# Patient Record
Sex: Female | Born: 1950
Health system: Southern US, Community
[De-identification: ages and names within clinical notes are randomized; demographics above are authoritative.]

## PROBLEM LIST (undated history)

## (undated) DIAGNOSIS — R053 Chronic cough: Secondary | ICD-10-CM

## (undated) DIAGNOSIS — D649 Anemia, unspecified: Secondary | ICD-10-CM

## (undated) DIAGNOSIS — M1990 Unspecified osteoarthritis, unspecified site: Secondary | ICD-10-CM

## (undated) DIAGNOSIS — M720 Palmar fascial fibromatosis [Dupuytren]: Secondary | ICD-10-CM

## (undated) DIAGNOSIS — J449 Chronic obstructive pulmonary disease, unspecified: Secondary | ICD-10-CM

## (undated) DIAGNOSIS — C3490 Malignant neoplasm of unspecified part of unspecified bronchus or lung: Secondary | ICD-10-CM

## (undated) DIAGNOSIS — E039 Hypothyroidism, unspecified: Secondary | ICD-10-CM

## (undated) DIAGNOSIS — I509 Heart failure, unspecified: Secondary | ICD-10-CM

## (undated) HISTORY — DX: Anemia, unspecified: D64.9

## (undated) HISTORY — PX: MASTECTOMY PARTIAL / LUMPECTOMY: SUR851

## (undated) HISTORY — DX: Heart failure, unspecified: I50.9

## (undated) HISTORY — PX: BREAST EXCISIONAL BIOPSY: SUR124

---

## 1974-07-01 HISTORY — PX: TUBAL LIGATION: SHX77

## 2007-03-20 ENCOUNTER — Ambulatory Visit: Payer: Self-pay | Admitting: Obstetrics and Gynecology

## 2007-07-02 HISTORY — PX: COLONOSCOPY: SHX174

## 2007-07-10 ENCOUNTER — Ambulatory Visit: Payer: Self-pay | Admitting: Gastroenterology

## 2008-12-02 ENCOUNTER — Ambulatory Visit: Payer: Self-pay | Admitting: Obstetrics and Gynecology

## 2010-03-05 ENCOUNTER — Ambulatory Visit: Payer: Self-pay | Admitting: Internal Medicine

## 2010-03-06 ENCOUNTER — Ambulatory Visit: Payer: Self-pay | Admitting: Internal Medicine

## 2010-03-07 ENCOUNTER — Ambulatory Visit: Payer: Self-pay | Admitting: Family Medicine

## 2010-06-14 ENCOUNTER — Ambulatory Visit: Payer: Self-pay | Admitting: Unknown Physician Specialty

## 2012-06-04 ENCOUNTER — Emergency Department: Payer: Self-pay | Admitting: Internal Medicine

## 2014-05-09 DIAGNOSIS — J449 Chronic obstructive pulmonary disease, unspecified: Secondary | ICD-10-CM | POA: Insufficient documentation

## 2014-05-09 DIAGNOSIS — E039 Hypothyroidism, unspecified: Secondary | ICD-10-CM | POA: Insufficient documentation

## 2014-06-13 ENCOUNTER — Ambulatory Visit: Payer: Self-pay | Admitting: Internal Medicine

## 2016-04-10 DIAGNOSIS — M25531 Pain in right wrist: Secondary | ICD-10-CM | POA: Insufficient documentation

## 2016-04-10 DIAGNOSIS — M79641 Pain in right hand: Secondary | ICD-10-CM | POA: Insufficient documentation

## 2016-04-10 DIAGNOSIS — M79642 Pain in left hand: Secondary | ICD-10-CM | POA: Insufficient documentation

## 2017-04-28 DIAGNOSIS — M720 Palmar fascial fibromatosis [Dupuytren]: Secondary | ICD-10-CM | POA: Insufficient documentation

## 2017-11-25 DIAGNOSIS — Z5181 Encounter for therapeutic drug level monitoring: Secondary | ICD-10-CM | POA: Diagnosis not present

## 2017-11-25 DIAGNOSIS — E034 Atrophy of thyroid (acquired): Secondary | ICD-10-CM | POA: Diagnosis not present

## 2017-12-01 DIAGNOSIS — Z720 Tobacco use: Secondary | ICD-10-CM | POA: Diagnosis not present

## 2017-12-01 DIAGNOSIS — Z13220 Encounter for screening for lipoid disorders: Secondary | ICD-10-CM | POA: Diagnosis not present

## 2017-12-01 DIAGNOSIS — J449 Chronic obstructive pulmonary disease, unspecified: Secondary | ICD-10-CM | POA: Diagnosis not present

## 2017-12-01 DIAGNOSIS — E034 Atrophy of thyroid (acquired): Secondary | ICD-10-CM | POA: Diagnosis not present

## 2018-07-07 DIAGNOSIS — E034 Atrophy of thyroid (acquired): Secondary | ICD-10-CM | POA: Diagnosis not present

## 2018-07-07 DIAGNOSIS — Z136 Encounter for screening for cardiovascular disorders: Secondary | ICD-10-CM | POA: Diagnosis not present

## 2018-07-07 DIAGNOSIS — J449 Chronic obstructive pulmonary disease, unspecified: Secondary | ICD-10-CM | POA: Diagnosis not present

## 2018-07-14 DIAGNOSIS — Z0001 Encounter for general adult medical examination with abnormal findings: Secondary | ICD-10-CM | POA: Diagnosis not present

## 2018-07-14 DIAGNOSIS — Z1239 Encounter for other screening for malignant neoplasm of breast: Secondary | ICD-10-CM | POA: Diagnosis not present

## 2018-07-14 DIAGNOSIS — E034 Atrophy of thyroid (acquired): Secondary | ICD-10-CM | POA: Diagnosis not present

## 2018-07-14 DIAGNOSIS — Z124 Encounter for screening for malignant neoplasm of cervix: Secondary | ICD-10-CM | POA: Diagnosis not present

## 2018-07-14 DIAGNOSIS — Z0000 Encounter for general adult medical examination without abnormal findings: Secondary | ICD-10-CM | POA: Diagnosis not present

## 2018-07-14 DIAGNOSIS — F17210 Nicotine dependence, cigarettes, uncomplicated: Secondary | ICD-10-CM | POA: Diagnosis not present

## 2018-07-14 DIAGNOSIS — J449 Chronic obstructive pulmonary disease, unspecified: Secondary | ICD-10-CM | POA: Diagnosis not present

## 2018-07-14 DIAGNOSIS — Z720 Tobacco use: Secondary | ICD-10-CM | POA: Diagnosis not present

## 2018-08-05 ENCOUNTER — Other Ambulatory Visit: Payer: Self-pay | Admitting: Obstetrics and Gynecology

## 2018-08-05 DIAGNOSIS — Z1239 Encounter for other screening for malignant neoplasm of breast: Secondary | ICD-10-CM | POA: Diagnosis not present

## 2018-08-05 DIAGNOSIS — Z1231 Encounter for screening mammogram for malignant neoplasm of breast: Secondary | ICD-10-CM

## 2018-08-05 DIAGNOSIS — Z720 Tobacco use: Secondary | ICD-10-CM | POA: Diagnosis not present

## 2018-08-05 DIAGNOSIS — Z124 Encounter for screening for malignant neoplasm of cervix: Secondary | ICD-10-CM | POA: Diagnosis not present

## 2018-08-05 DIAGNOSIS — Z1211 Encounter for screening for malignant neoplasm of colon: Secondary | ICD-10-CM | POA: Diagnosis not present

## 2018-08-05 DIAGNOSIS — K621 Rectal polyp: Secondary | ICD-10-CM | POA: Diagnosis not present

## 2018-08-10 ENCOUNTER — Telehealth: Payer: Self-pay | Admitting: *Deleted

## 2018-08-10 DIAGNOSIS — Z122 Encounter for screening for malignant neoplasm of respiratory organs: Secondary | ICD-10-CM

## 2018-08-10 DIAGNOSIS — Z87891 Personal history of nicotine dependence: Secondary | ICD-10-CM

## 2018-08-10 NOTE — Telephone Encounter (Signed)
Received referral for initial lung cancer screening scan. Contacted patient and obtained smoking history,(current, 53 pack year) as well as answering questions related to screening process. Patient denies signs of lung cancer such as weight loss or hemoptysis. Patient denies comorbidity that would prevent curative treatment if lung cancer were found. Patient is scheduled for shared decision making visit and CT scan on 08/27/18 at 145pm.

## 2018-08-17 ENCOUNTER — Encounter: Payer: Self-pay | Admitting: Radiology

## 2018-08-17 ENCOUNTER — Ambulatory Visit
Admission: RE | Admit: 2018-08-17 | Discharge: 2018-08-17 | Disposition: A | Discharge status: Home / Self Care | Payer: Medicare HMO | Source: Ambulatory Visit | Attending: Obstetrics and Gynecology | Admitting: Obstetrics and Gynecology

## 2018-08-17 DIAGNOSIS — Z1231 Encounter for screening mammogram for malignant neoplasm of breast: Secondary | ICD-10-CM | POA: Insufficient documentation

## 2018-08-26 ENCOUNTER — Telehealth: Payer: Self-pay | Admitting: *Deleted

## 2018-08-26 NOTE — Telephone Encounter (Signed)
Called pt to remind her of her and her spouses appts for ldct screening on 08-27-2018@1345  and 1430, voiced understanding

## 2018-08-27 ENCOUNTER — Encounter: Payer: Self-pay | Admitting: Nurse Practitioner

## 2018-08-27 ENCOUNTER — Inpatient Hospital Stay: Payer: Medicare HMO | Attending: Nurse Practitioner | Admitting: Nurse Practitioner

## 2018-08-27 ENCOUNTER — Ambulatory Visit
Admission: RE | Admit: 2018-08-27 | Discharge: 2018-08-27 | Disposition: A | Discharge status: Home / Self Care | Payer: Medicare HMO | Source: Ambulatory Visit | Attending: Nurse Practitioner | Admitting: Nurse Practitioner

## 2018-08-27 DIAGNOSIS — Z122 Encounter for screening for malignant neoplasm of respiratory organs: Secondary | ICD-10-CM | POA: Insufficient documentation

## 2018-08-27 DIAGNOSIS — Z87891 Personal history of nicotine dependence: Secondary | ICD-10-CM | POA: Insufficient documentation

## 2018-08-27 DIAGNOSIS — Z720 Tobacco use: Secondary | ICD-10-CM

## 2018-08-27 DIAGNOSIS — F17210 Nicotine dependence, cigarettes, uncomplicated: Secondary | ICD-10-CM | POA: Diagnosis not present

## 2018-08-27 NOTE — Progress Notes (Signed)
In accordance with CMS guidelines, patient has met eligibility criteria including age, absence of signs or symptoms of lung cancer.  Social History   Tobacco Use  . Smoking status: Current Every Day Smoker    Packs/day: 1.00    Years: 53.00    Pack years: 53.00    Types: Cigarettes  Substance Use Topics  . Alcohol use: Not on file  . Drug use: Not on file      A shared decision-making session was conducted prior to the performance of CT scan. This includes one or more decision aids, includes benefits and harms of screening, follow-up diagnostic testing, over-diagnosis, false positive rate, and total radiation exposure.   Counseling on the importance of adherence to annual lung cancer LDCT screening, impact of co-morbidities, and ability or willingness to undergo diagnosis and treatment is imperative for compliance of the program.   Counseling on the importance of continued smoking cessation for former smokers; the importance of smoking cessation for current smokers, and information about tobacco cessation interventions have been given to patient including [place] and [facility] quit [place] programs.   Written order for lung cancer screening with LDCT has been given to the patient and any and all questions have been answered to the best of my abilities.    Yearly follow up will be coordinated by Burgess Estelle, Thoracic Navigator.  Beckey Rutter, DNP, AGNP-C [place] [facility] [phone] (work cell) [phone] (office) 08/27/18 2:45 PM

## 2018-08-31 ENCOUNTER — Telehealth: Payer: Self-pay | Admitting: *Deleted

## 2018-08-31 NOTE — Telephone Encounter (Signed)
Notified patient of LDCT lung cancer screening program results with recommendation for 12 month follow up imaging. Also notified of incidental findings noted below and is encouraged to discuss further with PCP who will receive a copy of this note and/or the CT report. Patient verbalizes understanding.   IMPRESSION: 1. Lung-RADS Category 2, benign appearance or behavior. Continue annual screening with low-dose chest CT without contrast in 12 months. 2. Coronary artery atherosclerosis.  Aortic Atherosclerosis (ICD10-I70.0) and Emphysema (ICD10-J43.9).

## 2018-09-02 ENCOUNTER — Encounter: Payer: Self-pay | Admitting: *Deleted

## 2019-03-31 DIAGNOSIS — E034 Atrophy of thyroid (acquired): Secondary | ICD-10-CM | POA: Diagnosis not present

## 2019-03-31 DIAGNOSIS — J449 Chronic obstructive pulmonary disease, unspecified: Secondary | ICD-10-CM | POA: Diagnosis not present

## 2019-04-02 DIAGNOSIS — J449 Chronic obstructive pulmonary disease, unspecified: Secondary | ICD-10-CM | POA: Diagnosis not present

## 2019-04-02 DIAGNOSIS — Z720 Tobacco use: Secondary | ICD-10-CM | POA: Diagnosis not present

## 2019-04-02 DIAGNOSIS — J329 Chronic sinusitis, unspecified: Secondary | ICD-10-CM | POA: Diagnosis not present

## 2019-04-02 DIAGNOSIS — E034 Atrophy of thyroid (acquired): Secondary | ICD-10-CM | POA: Diagnosis not present

## 2019-04-02 DIAGNOSIS — E038 Other specified hypothyroidism: Secondary | ICD-10-CM | POA: Diagnosis not present

## 2019-04-05 IMAGING — MG DIGITAL SCREENING BILATERAL MAMMOGRAM WITH TOMO AND CAD
8 series · 9 of 24 positions shown · non-contrast
Comparison: Previous exam(s).

CLINICAL DATA: Screening.

EXAM:
DIGITAL SCREENING BILATERAL MAMMOGRAM WITH TOMO AND CAD

[R CC synth-2D]
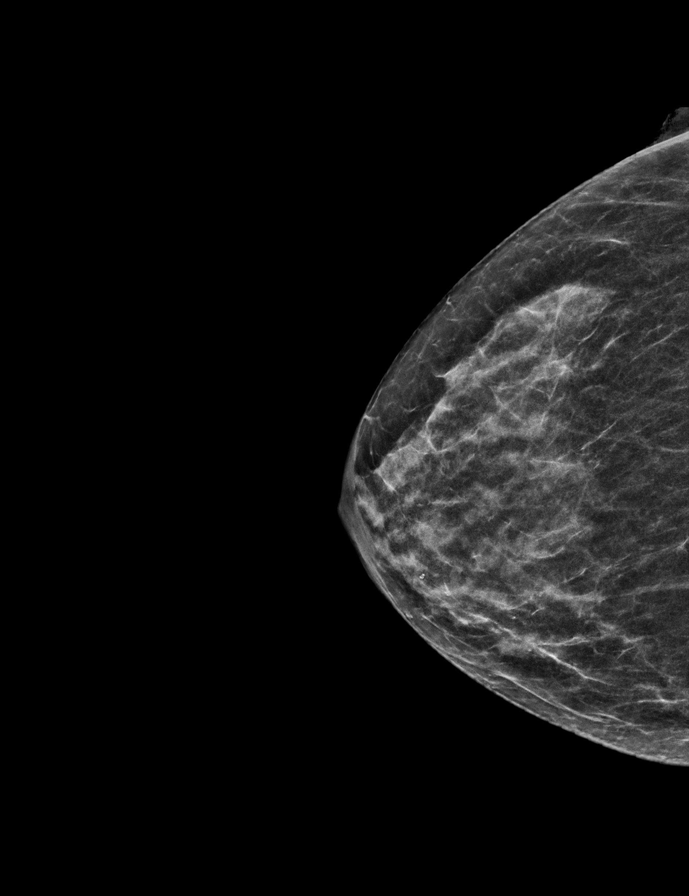

[L MLO synth-2D]
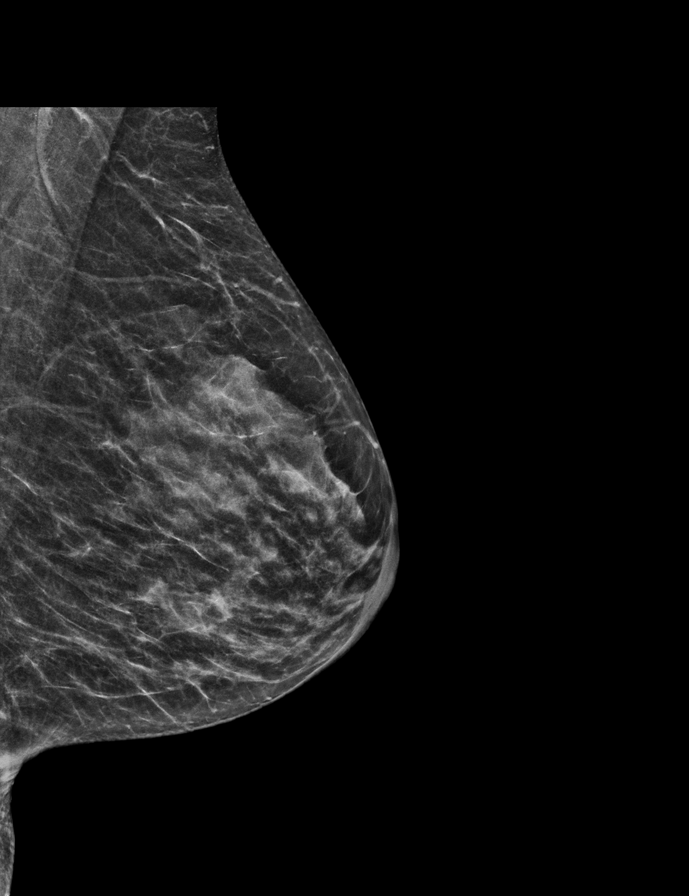

[L CC synth-2D]
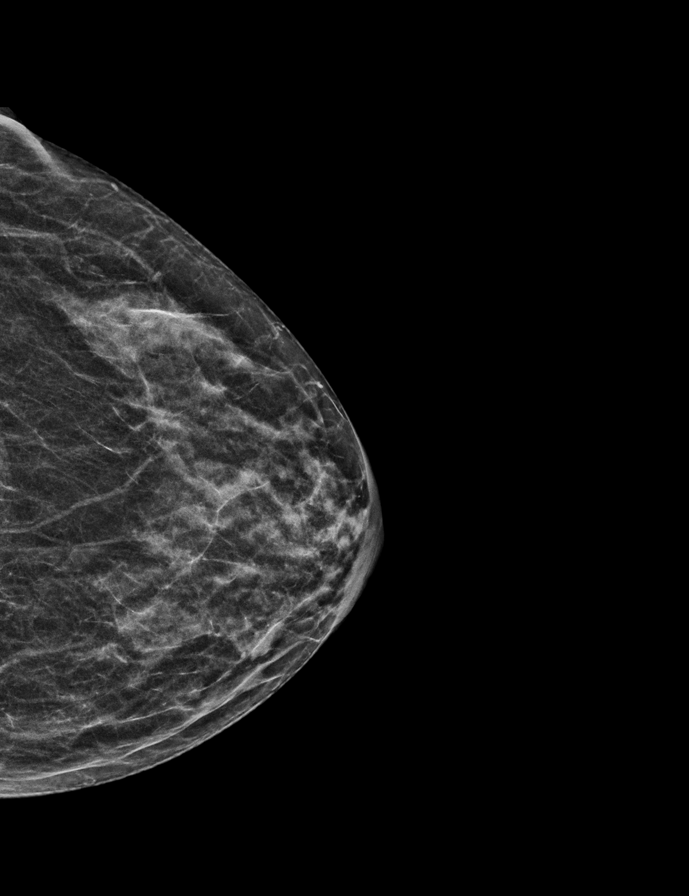

[R MLO synth-2D]
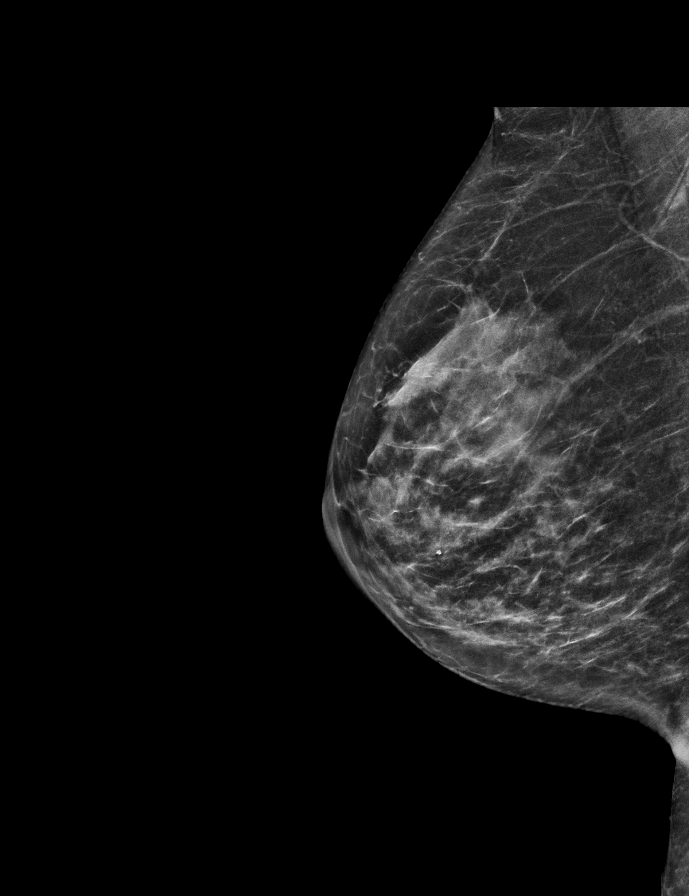

[R MLO tomo · 2 of 53 frames shown]
[frame 18/53]
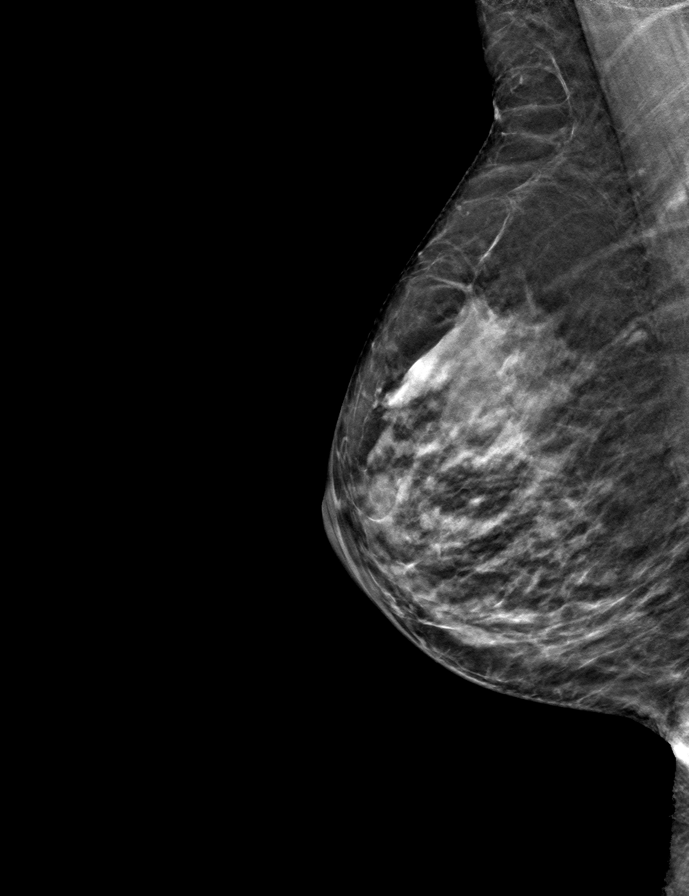
[frame 27/53]
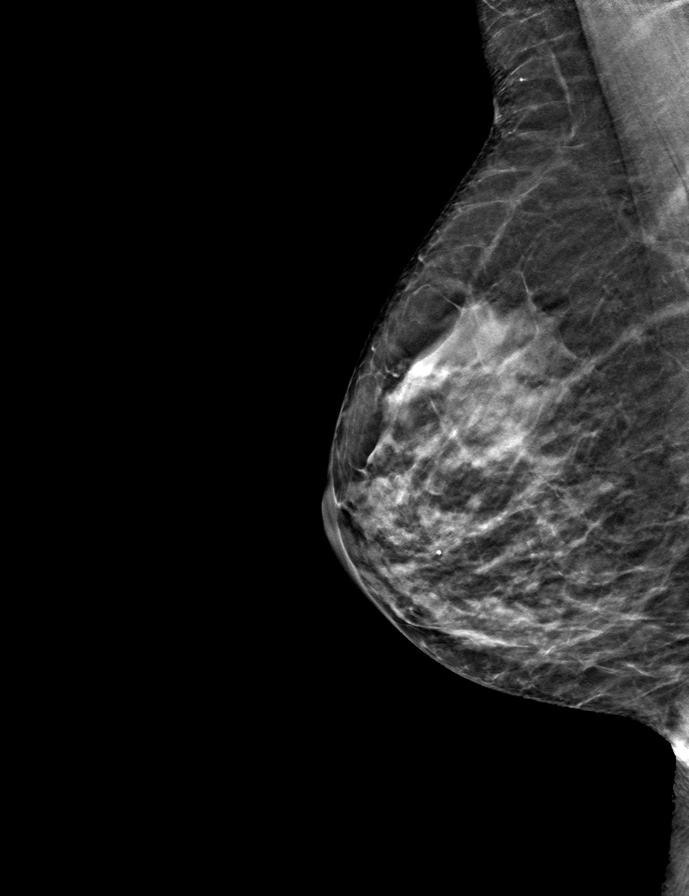

[L MLO tomo · tomo slice 24/47.0]
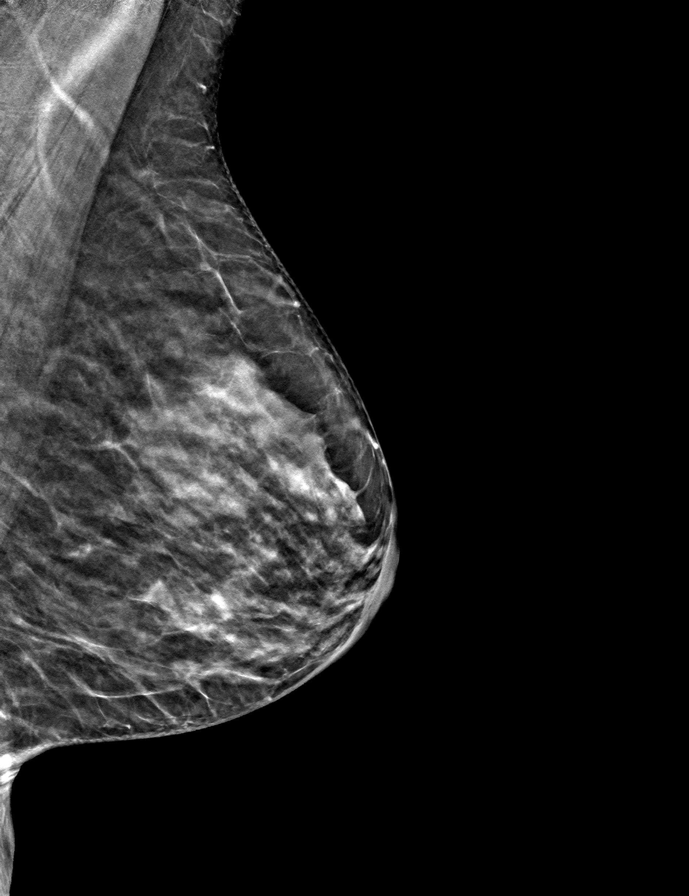

[R CC tomo · tomo slice 23/44.0]
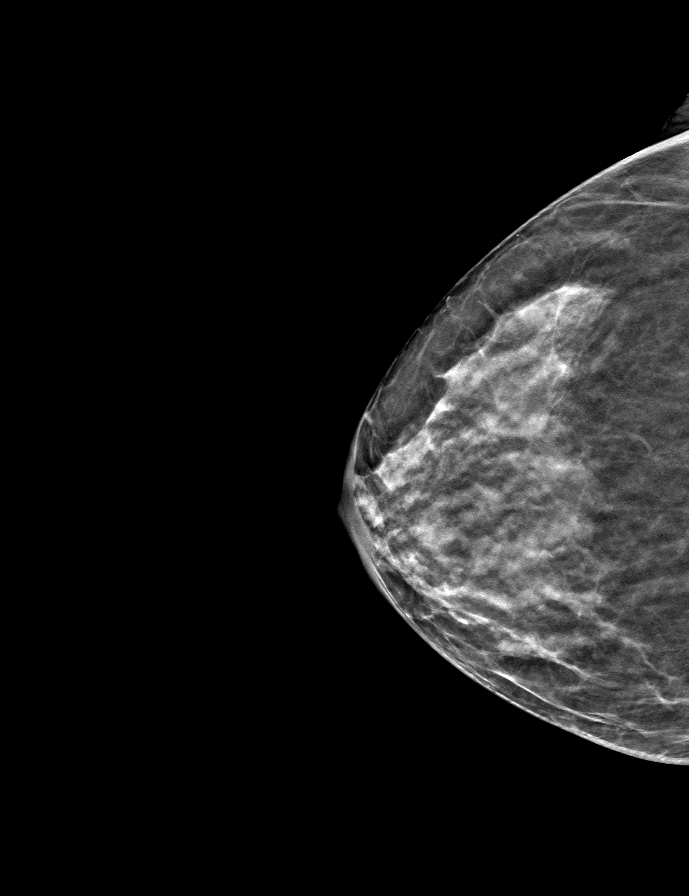

[L CC tomo · tomo slice 20/39.0]
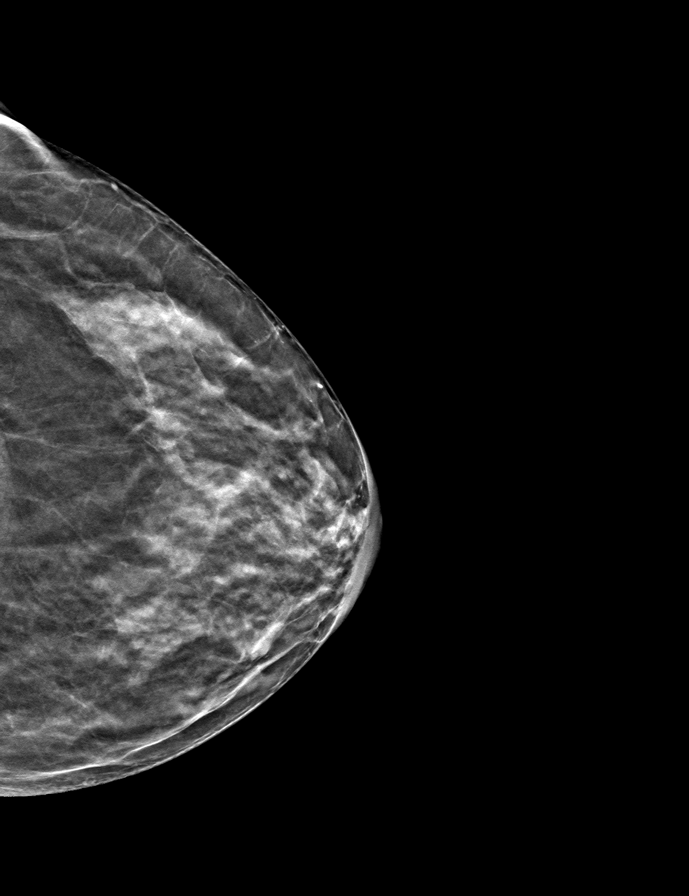

[9 of 24 positions shown; findings below may reference images not displayed]

ACR Breast Density Category c: The breast tissue is heterogeneously
dense, which may obscure small masses.
FINDINGS: There are no findings suspicious for malignancy. Images were
processed with CAD.
IMPRESSION: No mammographic evidence of malignancy. A result letter of this
screening mammogram will be mailed directly to the patient.

RECOMMENDATION:
Screening mammogram in one year. (Code:[id number])

BI-RADS CATEGORY  1: Negative.

## 2019-08-13 DIAGNOSIS — H25013 Cortical age-related cataract, bilateral: Secondary | ICD-10-CM | POA: Diagnosis not present

## 2019-08-26 ENCOUNTER — Telehealth: Payer: Self-pay | Admitting: *Deleted

## 2019-08-26 NOTE — Telephone Encounter (Signed)
(  08/26/2019) Left message for patient to notify them that it is time to schedule annual low dose lung cancer screening CT scan. Instructed patient to call back to verify information prior to the scan being scheduled SRW     

## 2019-09-06 ENCOUNTER — Telehealth: Payer: Self-pay | Admitting: *Deleted

## 2019-09-06 DIAGNOSIS — Z87891 Personal history of nicotine dependence: Secondary | ICD-10-CM

## 2019-09-06 NOTE — Telephone Encounter (Signed)
Patient has been notified that annual lung cancer screening low dose CT scan is due currently or will be in near future. Confirmed that patient is within the age range of 55-77, and asymptomatic, (no signs or symptoms of lung cancer). Patient denies illness that would prevent curative treatment for lung cancer if found. Verified smoking history, (current, 54 pack year). The shared decision making visit was done 08/27/18. Patient is agreeable for CT scan being scheduled.

## 2019-09-27 ENCOUNTER — Ambulatory Visit: Admission: RE | Admit: 2019-09-27 | Payer: Medicare HMO | Source: Ambulatory Visit

## 2019-09-27 DIAGNOSIS — E034 Atrophy of thyroid (acquired): Secondary | ICD-10-CM | POA: Diagnosis not present

## 2019-09-27 DIAGNOSIS — J449 Chronic obstructive pulmonary disease, unspecified: Secondary | ICD-10-CM | POA: Diagnosis not present

## 2019-09-30 ENCOUNTER — Telehealth: Payer: Self-pay

## 2019-09-30 NOTE — Telephone Encounter (Signed)
Message left notifying patient that it is time to schedule the low dose lung cancer screening CT scan.  Instructed patient to return call to Shawn Perkins at [phone] to verify information prior to CT scan being scheduled.    

## 2019-10-08 DIAGNOSIS — F17210 Nicotine dependence, cigarettes, uncomplicated: Secondary | ICD-10-CM | POA: Diagnosis not present

## 2019-10-08 DIAGNOSIS — E034 Atrophy of thyroid (acquired): Secondary | ICD-10-CM | POA: Diagnosis not present

## 2019-10-08 DIAGNOSIS — E038 Other specified hypothyroidism: Secondary | ICD-10-CM | POA: Diagnosis not present

## 2019-10-08 DIAGNOSIS — J449 Chronic obstructive pulmonary disease, unspecified: Secondary | ICD-10-CM | POA: Diagnosis not present

## 2019-10-08 DIAGNOSIS — Z0001 Encounter for general adult medical examination with abnormal findings: Secondary | ICD-10-CM | POA: Diagnosis not present

## 2019-11-05 ENCOUNTER — Telehealth: Payer: Self-pay

## 2019-11-05 NOTE — Telephone Encounter (Signed)
Message left notifying patient that it is time to schedule the low dose lung cancer screening CT scan.  Instructed patient to return call to Shawn Perkins at [phone] to verify information prior to CT scan being scheduled.    

## 2019-12-28 ENCOUNTER — Telehealth: Payer: Self-pay | Admitting: *Deleted

## 2019-12-28 DIAGNOSIS — Z122 Encounter for screening for malignant neoplasm of respiratory organs: Secondary | ICD-10-CM

## 2019-12-28 DIAGNOSIS — Z87891 Personal history of nicotine dependence: Secondary | ICD-10-CM

## 2019-12-28 NOTE — Addendum Note (Signed)
Addended by: Jonne Ply on: 12/28/2019 11:33 AM   Modules accepted: Orders

## 2019-12-28 NOTE — Telephone Encounter (Signed)
(  12/28/2019) Pt has been notified that lung cancer screening CT scan is due currently or will be in near future. Confirmed pt is within appropriate age range, and asymptomatic. Pt denies illness that would prevent curative treatment for lung cancer if found. Verified smoking history (Current Smoker,1 ppd ). Pt did receive 2nd COVID VX on (11/19/19) [CT to be scheduled approx. 4 weeks after vx date] Pt is agreeable for CT scan being scheduled, has no day or time preference. SRW

## 2019-12-28 NOTE — Telephone Encounter (Signed)
Smoking history: current, 54 pack year °

## 2020-01-13 ENCOUNTER — Ambulatory Visit: Admission: RE | Admit: 2020-01-13 | Payer: Medicare Other | Source: Ambulatory Visit

## 2020-01-27 ENCOUNTER — Other Ambulatory Visit: Payer: Self-pay

## 2020-01-27 ENCOUNTER — Ambulatory Visit
Admission: RE | Admit: 2020-01-27 | Discharge: 2020-01-27 | Disposition: A | Discharge status: Home / Self Care | Payer: Medicare Other | Source: Ambulatory Visit | Attending: Oncology | Admitting: Oncology

## 2020-01-27 DIAGNOSIS — Z87891 Personal history of nicotine dependence: Secondary | ICD-10-CM | POA: Diagnosis not present

## 2020-01-27 DIAGNOSIS — Z122 Encounter for screening for malignant neoplasm of respiratory organs: Secondary | ICD-10-CM | POA: Insufficient documentation

## 2020-01-31 ENCOUNTER — Telehealth: Payer: Self-pay | Admitting: *Deleted

## 2020-01-31 NOTE — Telephone Encounter (Signed)
Notified patient of LDCT lung cancer screening program results with recommendation for 6 month follow up imaging. Also notified of incidental findings noted below and is encouraged to discuss further with PCP who will receive a copy of this note and/or the CT report. Patient verbalizes understanding.    IMPRESSION: 1. Lung-RADS 3, probably benign findings. Short-term follow-up in 6 months is recommended with repeat low-dose chest CT without contrast (please use the following order, "CT CHEST LCS NODULE FOLLOW-UP W/O CM"). New right lower lobe pulmonary nodule of volume derived equivalent diameter 5.4 mm. 2. Aortic Atherosclerosis (ICD10-I70.0) and Emphysema (ICD10-J43.9). Coronary artery atherosclerosis

## 2020-07-26 ENCOUNTER — Other Ambulatory Visit: Payer: Self-pay | Admitting: *Deleted

## 2020-07-26 DIAGNOSIS — Z87891 Personal history of nicotine dependence: Secondary | ICD-10-CM

## 2020-07-26 DIAGNOSIS — R918 Other nonspecific abnormal finding of lung field: Secondary | ICD-10-CM

## 2020-07-26 NOTE — Progress Notes (Signed)
Contacted and scheduled for LCS nodule follow up scan. Delay per patient request. Patient is a current smoker with a 54.5 pack year history.

## 2020-08-10 ENCOUNTER — Ambulatory Visit
Admission: RE | Admit: 2020-08-10 | Discharge: 2020-08-10 | Disposition: A | Discharge status: Home / Self Care | Payer: Medicare Other | Source: Ambulatory Visit | Attending: Nurse Practitioner | Admitting: Nurse Practitioner

## 2020-08-10 ENCOUNTER — Other Ambulatory Visit: Payer: Self-pay

## 2020-08-10 DIAGNOSIS — Z87891 Personal history of nicotine dependence: Secondary | ICD-10-CM | POA: Diagnosis present

## 2020-08-10 DIAGNOSIS — R918 Other nonspecific abnormal finding of lung field: Secondary | ICD-10-CM | POA: Diagnosis present

## 2020-08-14 ENCOUNTER — Encounter: Payer: Self-pay | Admitting: *Deleted

## 2021-03-29 IMAGING — CT CT CHEST LCS NODULE FOLLOW-UP W/O CM
2 of 5 series · 15 of 40 positions shown, 18 images · non-contrast
Comparison: 01/27/2020.

CLINICAL DATA: Lung cancer screening. Current smoker. Fifty-four
pack-year history.

EXAM:
CT CHEST WITHOUT CONTRAST FOR LUNG CANCER SCREENING NODULE FOLLOW-UP
TECHNIQUE: Multidetector CT imaging of the chest was performed following the
standard protocol without IV contrast.

[Series 3: lung lcs f/u 1.00 · axial · 0.63mm/px · z∈[-1143,-864]mm · 12 of 309 slices shown, 15 images]
[im 15/309  mediastinal]
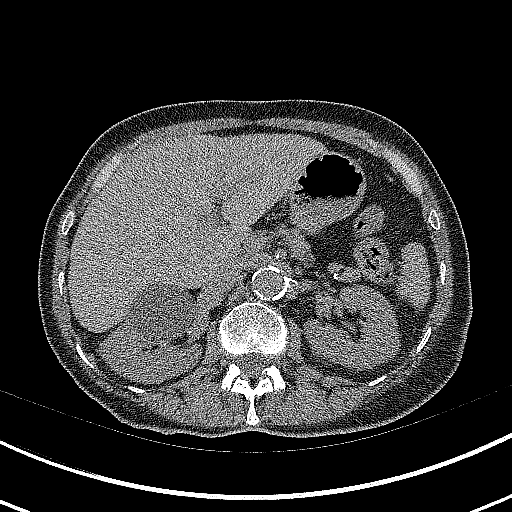
[im 15/309  lung]
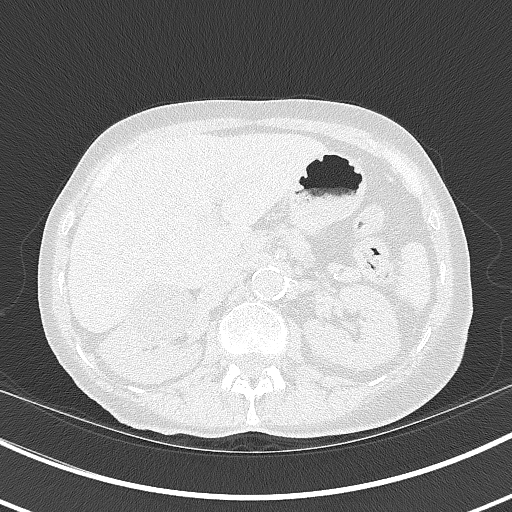
[im 45/309  lung]
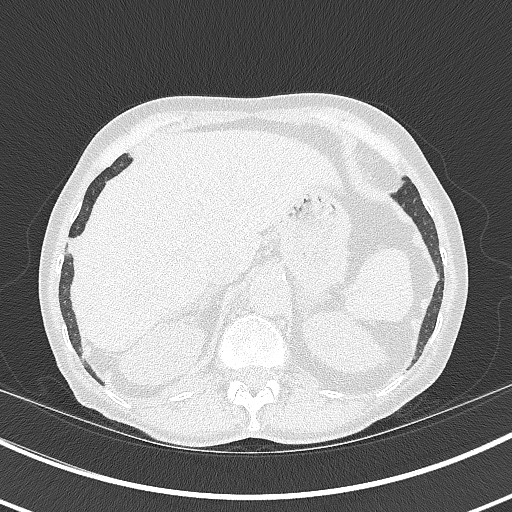
[im 74/309  lung]
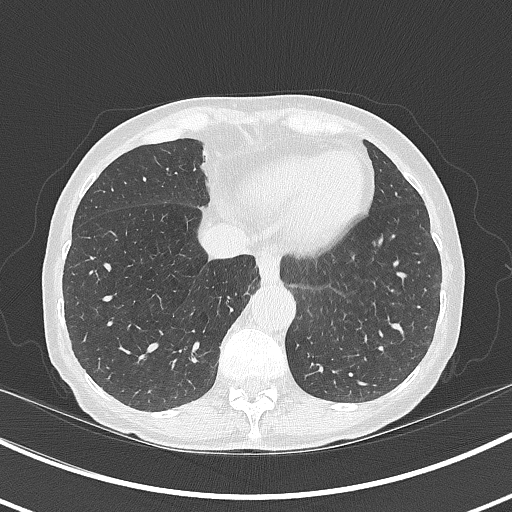
[im 89/309  lung]
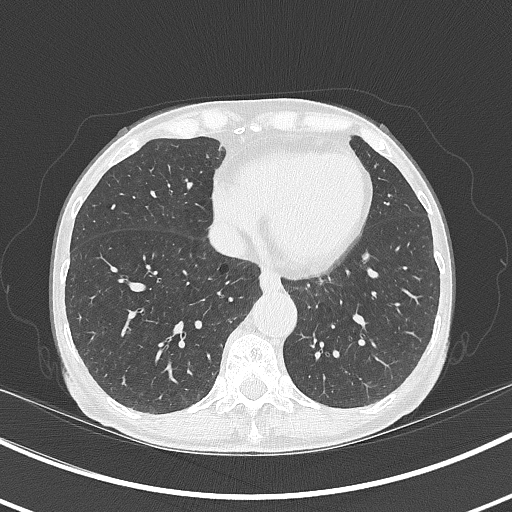
[im 118/309  mediastinal]
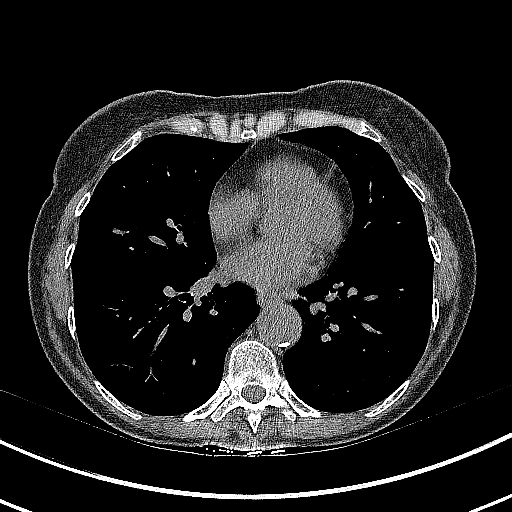
[im 118/309  lung]
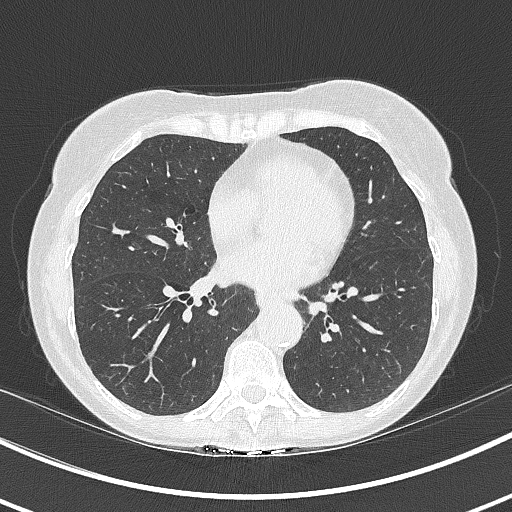
[im 147/309  lung]
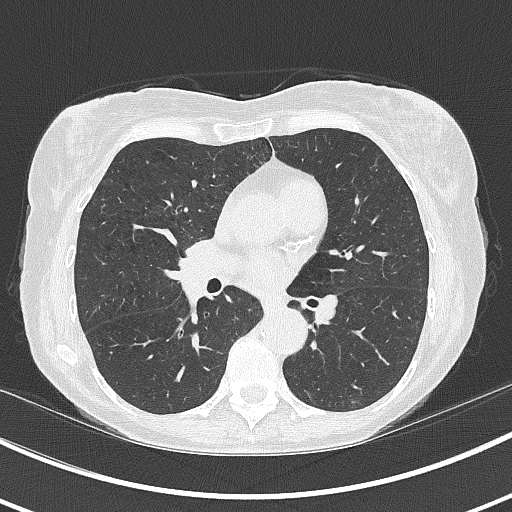
[im 162/309  lung]
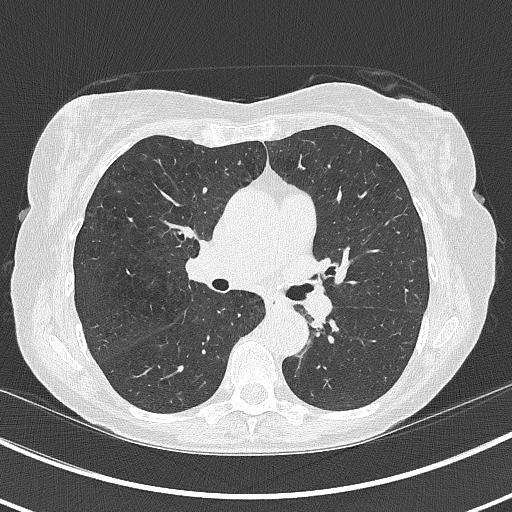
[im 191/309  lung]
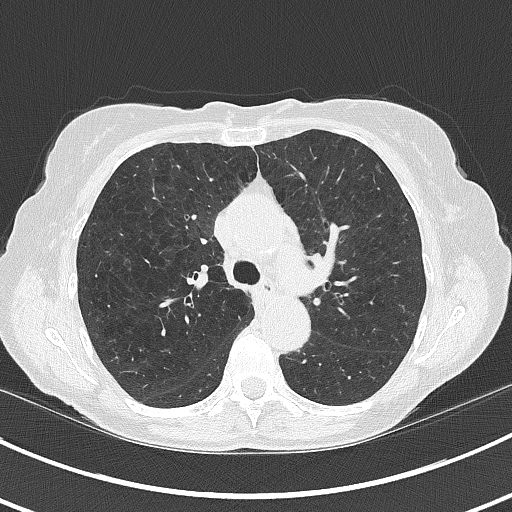
[im 221/309  mediastinal]
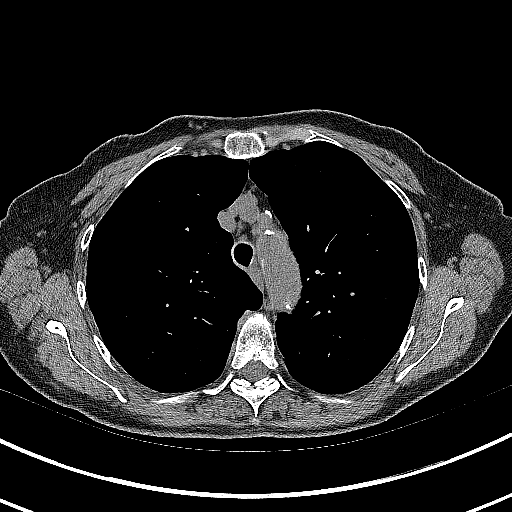
[im 221/309  lung]
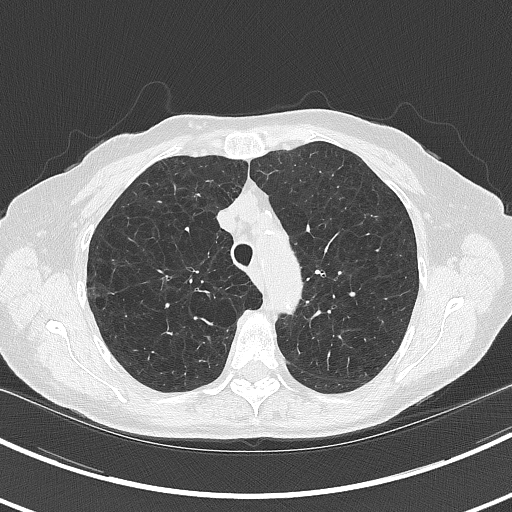
[im 235/309  lung]
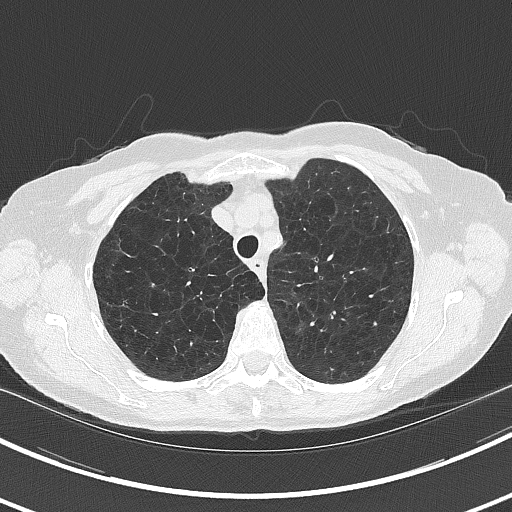
[im 265/309  lung]
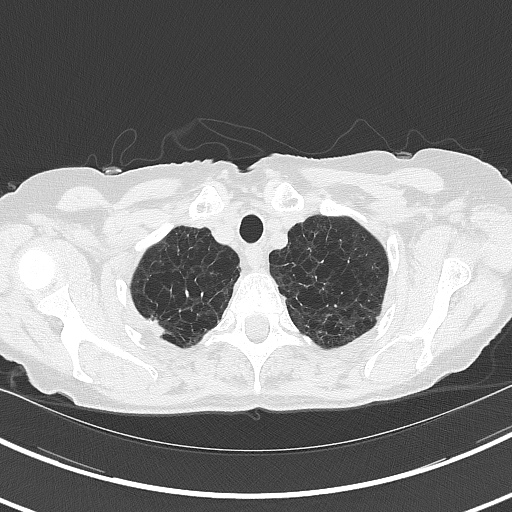
[im 294/309  lung]
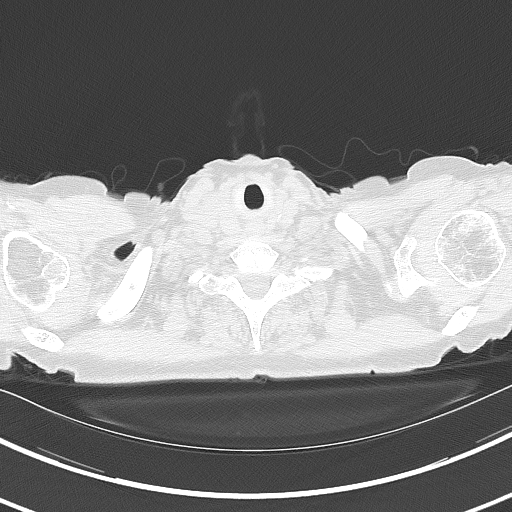

[Series 4: lcs f/u 1.00 cor · coronal · 0.61mm/px · 3 of 267 slices shown]
[im 54/267  lung]
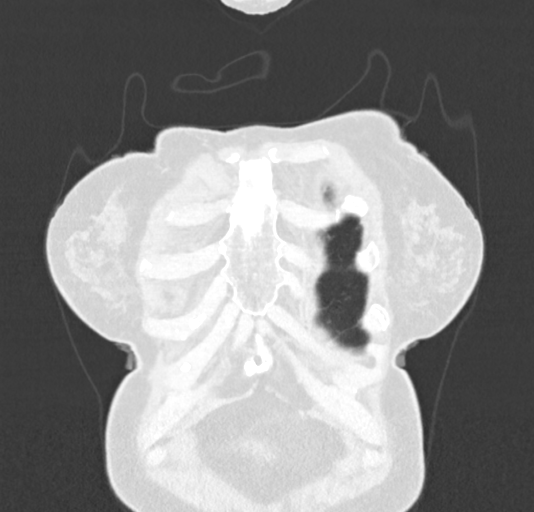
[im 107/267  lung]
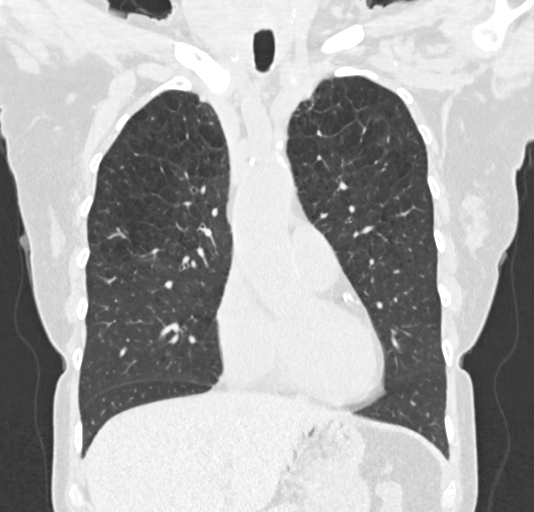
[im 160/267  lung]
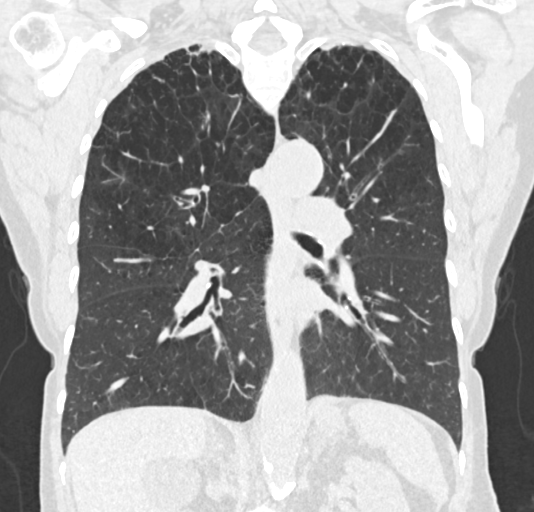

[15 of 40 positions shown; findings below may reference images not displayed]

FINDINGS: Cardiovascular: Heart size is normal. Aortic atherosclerosis.
Coronary artery atherosclerotic calcifications.

Mediastinum/Nodes: No enlarged mediastinal, hilar, or axillary lymph
nodes. Thyroid gland, trachea, and esophagus demonstrate no
significant findings.

Lungs/Pleura: No pleural effusion, airspace consolidation, or
atelectasis. Advanced changes of paraseptal and centrilobular
emphysema.

Previously noted small pulmonary nodules are stable to decreased in
size in the interval. The largest is in the subpleural aspect of the
posterolateral right upper lobe. This has an equivalent diameter of
3 mm. No suspicious lung nodules identified.

Upper Abdomen: No acute abnormality. Aortic atherosclerosis. 4.2 cm
right kidney cyst noted.

Musculoskeletal: No chest wall mass or suspicious bone lesions
identified.
IMPRESSION: 1. Lung-RADS 2, benign appearance or behavior. Continue annual
screening with low-dose chest CT without contrast in 12 months.
2. Aortic Atherosclerosis (DIEGJ-SY2.2) and Emphysema (DIEGJ-EBD.D).
3. Coronary artery calcifications.

## 2021-05-01 ENCOUNTER — Encounter: Payer: Self-pay | Admitting: Internal Medicine

## 2021-05-02 ENCOUNTER — Ambulatory Visit: Payer: Medicare Other | Admitting: Anesthesiology

## 2021-05-02 ENCOUNTER — Encounter
Admission: RE | Disposition: A | Discharge status: Home / Self Care | Payer: Self-pay | Source: Home / Self Care | Attending: Internal Medicine

## 2021-05-02 ENCOUNTER — Ambulatory Visit
Admission: RE | Admit: 2021-05-02 | Discharge: 2021-05-02 | Disposition: A | Discharge status: Home / Self Care | Payer: Medicare Other | Attending: Internal Medicine | Admitting: Internal Medicine

## 2021-05-02 ENCOUNTER — Encounter: Payer: Self-pay | Admitting: Internal Medicine

## 2021-05-02 DIAGNOSIS — Z7951 Long term (current) use of inhaled steroids: Secondary | ICD-10-CM | POA: Diagnosis not present

## 2021-05-02 DIAGNOSIS — Z79899 Other long term (current) drug therapy: Secondary | ICD-10-CM | POA: Insufficient documentation

## 2021-05-02 DIAGNOSIS — K635 Polyp of colon: Secondary | ICD-10-CM | POA: Insufficient documentation

## 2021-05-02 DIAGNOSIS — K640 First degree hemorrhoids: Secondary | ICD-10-CM | POA: Diagnosis not present

## 2021-05-02 DIAGNOSIS — J449 Chronic obstructive pulmonary disease, unspecified: Secondary | ICD-10-CM | POA: Diagnosis not present

## 2021-05-02 DIAGNOSIS — Z1211 Encounter for screening for malignant neoplasm of colon: Secondary | ICD-10-CM | POA: Insufficient documentation

## 2021-05-02 DIAGNOSIS — E039 Hypothyroidism, unspecified: Secondary | ICD-10-CM | POA: Diagnosis not present

## 2021-05-02 DIAGNOSIS — Z880 Allergy status to penicillin: Secondary | ICD-10-CM | POA: Insufficient documentation

## 2021-05-02 DIAGNOSIS — Z79890 Hormone replacement therapy (postmenopausal): Secondary | ICD-10-CM | POA: Insufficient documentation

## 2021-05-02 DIAGNOSIS — K5730 Diverticulosis of large intestine without perforation or abscess without bleeding: Secondary | ICD-10-CM | POA: Diagnosis not present

## 2021-05-02 HISTORY — PX: COLONOSCOPY: SHX5424

## 2021-05-02 HISTORY — DX: Hypothyroidism, unspecified: E03.9

## 2021-05-02 HISTORY — DX: Palmar fascial fibromatosis (dupuytren): M72.0

## 2021-05-02 HISTORY — DX: Chronic obstructive pulmonary disease, unspecified: J44.9

## 2021-05-02 SURGERY — COLONOSCOPY
Anesthesia: General

## 2021-05-02 MED ORDER — LIDOCAINE HCL (CARDIAC) PF 100 MG/5ML IV SOSY
PREFILLED_SYRINGE | INTRAVENOUS | Status: DC | PRN
  Administered 2021-05-02: 50 mg via INTRAVENOUS

## 2021-05-02 MED ORDER — PROPOFOL 10 MG/ML IV BOLUS
INTRAVENOUS | Status: DC | PRN
  Administered 2021-05-02: 80 mg via INTRAVENOUS

## 2021-05-02 MED ORDER — PROPOFOL 500 MG/50ML IV EMUL
INTRAVENOUS | Status: DC | PRN
  Administered 2021-05-02: 150 ug/kg/min via INTRAVENOUS

## 2021-05-02 MED ORDER — SODIUM CHLORIDE 0.9 % IV SOLN
INTRAVENOUS | Status: DC
  Administered 2021-05-02: 1000 mL via INTRAVENOUS

## 2021-05-02 MED ORDER — PROPOFOL 10 MG/ML IV BOLUS
INTRAVENOUS | Status: AC
  Filled 2021-05-02: qty 40

## 2021-05-02 NOTE — H&P (Signed)
Outpatient short stay form Pre-procedure 05/02/2021 2:13 PM Denise Cashin K. Denise Allen, M.D.  Primary Physician: Denise Allen, M.D.  Reason for visit:  Colon cancer screening  History of present illness:  Patient presents for colonoscopy for colon cancer screening. The patient denies complaints of abdominal pain, significant change in bowel habits, or rectal bleeding.      Current Facility-Administered Medications:    0.9 %  sodium chloride infusion, , Intravenous, Continuous, Kmarion Rawl, Denise Nearing, MD  Medications Prior to Admission  Medication Sig Dispense Refill Last Dose   ALBUTEROL IN Inhale 90 mcg into the lungs every 6 (six) hours as needed (wheezing).   Past Week   budesonide-formoterol (SYMBICORT) 80-4.5 MCG/ACT inhaler Inhale 2 puffs into the lungs 2 (two) times daily.   Past Week   levothyroxine (SYNTHROID) 88 MCG tablet Take 88 mcg by mouth daily before breakfast.   05/02/2021 at 0730am   buPROPion (WELLBUTRIN XL) 150 MG 24 hr tablet Take 150 mg by mouth daily.        Allergies  Allergen Reactions   Penicillins Other (See Comments)     Past Medical History:  Diagnosis Date   COPD (chronic obstructive pulmonary disease) (HCC)    Dupuytren contracture    Hypothyroidism     Review of systems:  Otherwise negative.    Physical Exam  Gen: Alert, oriented. Appears stated age.  HEENT: [place]/AT. PERRLA. Lungs: CTA, no wheezes. CV: RR nl S1, S2. Abd: soft, benign, no masses. BS+ Ext: No edema. Pulses 2+    Planned procedures: Proceed with colonoscopy. The patient understands the nature of the planned procedure, indications, risks, alternatives and potential complications including but not limited to bleeding, infection, perforation, damage to internal organs and possible oversedation/side effects from anesthesia. The patient agrees and gives consent to proceed.  Please refer to procedure notes for findings, recommendations and patient disposition/instructions.     Denise Allen K.  Denise Allen, M.D. Gastroenterology 05/02/2021  2:13 PM

## 2021-05-02 NOTE — Transfer of Care (Signed)
Immediate Anesthesia Transfer of Care Note  Patient: Denise Allen  Procedure(s) Performed: COLONOSCOPY  Patient Location: Endoscopy Unit  Anesthesia Type:General  Level of Consciousness: drowsy  Airway & Oxygen Therapy: Patient Spontanous Breathing  Post-op Assessment: Report given to RN and Post -op Vital signs reviewed and stable  Post vital signs: Reviewed and stable  Last Vitals:  Vitals Value Taken Time  BP 122/70 05/02/21 1517  Temp    Pulse 88 05/02/21 1517  Resp 14 05/02/21 1517  SpO2 100 % 05/02/21 1517  Vitals shown include unvalidated device data.  Last Pain:  Vitals:   05/02/21 1358  PainSc: 0-No pain      Patients Stated Pain Goal: 0 (05/02/21 1358)  Complications: No notable events documented.

## 2021-05-02 NOTE — Anesthesia Procedure Notes (Signed)
Procedure Name: MAC Date/Time: 05/02/2021 2:54 PM Performed by: Biagio Borg, CRNA Pre-anesthesia Checklist: Patient identified, Emergency Drugs available, Suction available, Patient being monitored and Timeout performed Patient Re-evaluated:Patient Re-evaluated prior to induction Oxygen Delivery Method: Nasal cannula Induction Type: IV induction Placement Confirmation: positive ETCO2 and CO2 detector

## 2021-05-02 NOTE — Anesthesia Preprocedure Evaluation (Signed)
Anesthesia Evaluation  Patient identified by MRN, date of birth, ID band Patient awake    Reviewed: Allergy & Precautions, NPO status , Patient's Chart, lab work & pertinent test results  History of Anesthesia Complications Negative for: history of anesthetic complications  Airway Mallampati: II  TM Distance: >3 FB Neck ROM: Full    Dental  (+) Poor Dentition   Pulmonary COPD,  COPD inhaler, Current Smoker and Patient abstained from smoking.,    breath sounds clear to auscultation- rhonchi (-) wheezing      Cardiovascular Exercise Tolerance: Good (-) hypertension(-) CAD, (-) Past MI, (-) Cardiac Stents and (-) CABG  Rhythm:Regular Rate:Normal - Systolic murmurs and - Diastolic murmurs    Neuro/Psych neg Seizures negative neurological ROS  negative psych ROS   GI/Hepatic negative GI ROS, Neg liver ROS,   Endo/Other  neg diabetesHypothyroidism   Renal/GU negative Renal ROS     Musculoskeletal negative musculoskeletal ROS (+)   Abdominal (+) - obese,   Peds  Hematology negative hematology ROS (+)   Anesthesia Other Findings Past Medical History: No date: COPD (chronic obstructive pulmonary disease) (HCC) No date: Dupuytren contracture No date: Hypothyroidism   Reproductive/Obstetrics                             Anesthesia Physical Anesthesia Plan  ASA: 2  Anesthesia Plan: General   Post-op Pain Management:    Induction: Intravenous  PONV Risk Score and Plan: 1 and Propofol infusion  Airway Management Planned: Natural Airway  Additional Equipment:   Intra-op Plan:   Post-operative Plan:   Informed Consent: I have reviewed the patients History and Physical, chart, labs and discussed the procedure including the risks, benefits and alternatives for the proposed anesthesia with the patient or authorized representative who has indicated his/her understanding and acceptance.      Dental advisory given  Plan Discussed with: CRNA and Anesthesiologist  Anesthesia Plan Comments:         Anesthesia Quick Evaluation

## 2021-05-02 NOTE — Op Note (Signed)
[facility] Gastroenterology Patient Name: Denise Allen Procedure Date: 05/02/2021 2:47 PM MRN: [id number] Account #: [id number] Date of Birth: 05-03-1951 Admit Type: Outpatient Age: 70 Room: [facility] ENDO ROOM 2 Gender: Female Note Status: Finalized Instrument Name: Prentice Docker [id number] Procedure:             Colonoscopy Indications:           Screening for colorectal malignant neoplasm Providers:             Royce Macadamia K. [name] MD, MD Referring MD:          Gracelyn Nurse, MD (Referring MD) Medicines:             Propofol per Anesthesia Complications:         No immediate complications. Procedure:             Pre-Anesthesia Assessment:                        - The risks and benefits of the procedure and the                         sedation options and risks were discussed with the                         patient. All questions were answered and informed                         consent was obtained.                        - Patient identification and proposed procedure were                         verified prior to the procedure by the nurse. The                         procedure was verified in the procedure room.                        - ASA Grade Assessment: III - A patient with severe                         systemic disease.                        - After reviewing the risks and benefits, the patient                         was deemed in satisfactory condition to undergo the                         procedure.                        After obtaining informed consent, the colonoscope was                         passed under direct vision. Throughout the procedure,  the patient's blood pressure, pulse, and oxygen                         saturations were monitored continuously. The                         Colonoscope was introduced through the anus and                         advanced to the the cecum, identified by appendiceal                          orifice and ileocecal valve. The colonoscopy was                         performed without difficulty. The patient tolerated                         the procedure well. The quality of the bowel                         preparation was adequate. The ileocecal valve,                         appendiceal orifice, and rectum were photographed. Findings:      The perianal and digital rectal examinations were normal. Pertinent       negatives include normal sphincter tone and no palpable rectal lesions.      Non-bleeding internal hemorrhoids were found during retroflexion. The       hemorrhoids were Grade I (internal hemorrhoids that do not prolapse).      Many small-mouthed diverticula were found in the sigmoid colon.      Two sessile polyps were found in the proximal sigmoid colon and distal       sigmoid colon. The polyps were 7 to 9 mm in size. These polyps were       removed with a hot snare. Resection and retrieval were complete.      The exam was otherwise without abnormality. Impression:            - Non-bleeding internal hemorrhoids.                        - Diverticulosis in the sigmoid colon.                        - Two 7 to 9 mm polyps in the proximal sigmoid colon                         and in the distal sigmoid colon, removed with a hot                         snare. Resected and retrieved.                        - The examination was otherwise normal. Recommendation:        - Patient has a contact number available for  emergencies. The signs and symptoms of potential                         delayed complications were discussed with the patient.                         Return to normal activities tomorrow. Written                         discharge instructions were provided to the patient.                        - Resume previous diet.                        - Continue present medications.                        - Repeat colonoscopy is recommended for  surveillance.                         The colonoscopy date will be determined after                         pathology results from today's exam become available                         for review.                        - Return to GI office PRN.                        - The findings and recommendations were discussed with                         the patient. Procedure Code(s):     --- Professional ---                        [phone], Colonoscopy, flexible; with removal of                         tumor(s), polyp(s), or other lesion(s) by snare                         technique Diagnosis Code(s):     --- Professional ---                        K57.30, Diverticulosis of large intestine without                         perforation or abscess without bleeding                        K63.5, Polyp of colon                        K64.0, First degree hemorrhoids                        Z12.11, Encounter for screening  for malignant neoplasm                         of colon CPT copyright 2019 American Medical Association. All rights reserved. The codes documented in this report are preliminary and upon coder review may  be revised to meet current compliance requirements. Stanton Kidney MD, MD 05/02/2021 3:16:51 PM This report has been signed electronically. Number of Addenda: 0 Note Initiated On: 05/02/2021 2:47 PM Scope Withdrawal Time: 0 hours 8 minutes 41 seconds  Total Procedure Duration: 0 hours 13 minutes 2 seconds  Estimated Blood Loss:  Estimated blood loss: none.      [facility]

## 2021-05-02 NOTE — Interval H&P Note (Signed)
History and Physical Interval Note:  05/02/2021 2:13 PM  Denise Allen  has presented today for surgery, with the diagnosis of z12.11 Colon cancer screening.  The various methods of treatment have been discussed with the patient and family. After consideration of risks, benefits and other options for treatment, the patient has consented to  Procedure(s): COLONOSCOPY (N/A) as a surgical intervention.  The patient's history has been reviewed, patient examined, no change in status, stable for surgery.  I have reviewed the patient's chart and labs.  Questions were answered to the patient's satisfaction.     [place], [place]

## 2021-05-03 ENCOUNTER — Encounter: Payer: Self-pay | Admitting: Internal Medicine

## 2021-05-04 LAB — SURGICAL PATHOLOGY

## 2021-05-04 NOTE — Anesthesia Postprocedure Evaluation (Signed)
Anesthesia Post Note  Patient: Denise Allen  Procedure(s) Performed: COLONOSCOPY  Patient location during evaluation: Endoscopy Anesthesia Type: General Level of consciousness: awake and alert Pain management: pain level controlled Vital Signs Assessment: post-procedure vital signs reviewed and stable Respiratory status: spontaneous breathing, nonlabored ventilation, respiratory function stable and patient connected to nasal cannula oxygen Cardiovascular status: blood pressure returned to baseline and stable Postop Assessment: no apparent nausea or vomiting Anesthetic complications: no   No notable events documented.   Last Vitals:  Vitals:   05/02/21 1537 05/02/21 1538  BP: (!) 155/81   Pulse: 85 83  Resp: 18 (!) 23  Temp:    SpO2: 100% 100%    Last Pain:  Vitals:   05/03/21 0756  TempSrc:   PainSc: 0-No pain                 Lenard Simmer

## 2021-07-04 ENCOUNTER — Other Ambulatory Visit: Payer: Self-pay

## 2021-07-04 ENCOUNTER — Ambulatory Visit (INDEPENDENT_AMBULATORY_CARE_PROVIDER_SITE_OTHER): Payer: Medicare Other | Admitting: Family Medicine

## 2021-07-04 ENCOUNTER — Encounter: Payer: Self-pay | Admitting: Family Medicine

## 2021-07-04 VITALS — BP 104/74 | HR 87 | Ht 63.0 in | Wt 116.0 lb

## 2021-07-04 DIAGNOSIS — M720 Palmar fascial fibromatosis [Dupuytren]: Secondary | ICD-10-CM

## 2021-07-04 DIAGNOSIS — M65341 Trigger finger, right ring finger: Secondary | ICD-10-CM | POA: Diagnosis not present

## 2021-07-04 MED ORDER — DICLOFENAC SODIUM 1 % EX GEL: 2.0000 g | Freq: Three times a day (TID) | CUTANEOUS | 0 refills | Status: DC

## 2021-07-04 NOTE — Assessment & Plan Note (Signed)
Chronic condition that is asymptomatic, involvement at the right third finger, noted over 3 years, gradually progressive, denies any pain at the site.  Physical examination reveals somewhat flexed baseline position of third digit, she does have active flexion and limited extension, there is thickening of the flexor tendon and nodularity, these areas are nontender.

## 2021-07-04 NOTE — Progress Notes (Signed)
°  ° °  Primary Care / Sports Medicine Office Visit  Patient Information:  Patient ID: SHALYNN JORSTAD, female DOB: Sep 23, 1950 Age: 71 y.o. MRN: [id number]   Denise Allen is a pleasant 71 y.o. female presenting with the following:  Chief Complaint  Patient presents with   Hand Pain    Several years; right hand all fingers, has trigger finger, ring finger contracting for years, hard to cut her food; no imaging; 5/10 pain    Patient Active Problem List   Diagnosis Date Noted   Trigger finger, right ring finger 07/04/2021   Personal history of tobacco use, presenting hazards to health 08/27/2018   Dupuytren's contracture 04/28/2017   Bilateral hand pain 04/10/2016   Pain in right wrist 04/10/2016   COPD (chronic obstructive pulmonary disease) (HCC) 05/09/2014   Hypothyroidism 05/09/2014    Vitals:   07/04/21 1144  BP: 104/74  Pulse: 87  SpO2: 97%   Vitals:   07/04/21 1144  Weight: 116 lb (52.6 kg)  Height: 5\' 3"  (1.6 m)   Body mass index is 20.55 kg/m.  No results found.   Independent interpretation of notes and tests performed by another provider:   None  Procedures performed:   Procedure: Right fourth digit trigger finger splint application. AlumaFoam splint measured and cut, placed at volar palm from distal palmar crease to PIP, Coban bandage wrapped. Distal pulses and sensorimotor status confirmed pre and post application. Completed without difficulty and tolerated well.   Pertinent History, Exam, Impression, and Recommendations:   Trigger finger, right ring finger Right-hand-dominant patient presenting with several week history of right ring finger pain, triggering.  Pain localized to the distal palm, nonradiating, denies any trauma or change in activity at onset but she is highly active with painting.  Physical examination reveals focal tenderness and nodularity at the fourth distal palmar crease region, able to passively flex and extend, though with pain.  Her  clinical history and findings are most consistent with trigger finger of the right ring finger, treatment strategies reviewed, and given no treatments to date, I have advised immobilization (splint made for patient today), topical diclofenac 1% 3 times daily x2 weeks, and close follow-up in 2 weeks for reevaluation.  If suboptimal progress noted, can consider ultrasound-guided peritendinous cortisone injection.  Additionally, if splint issues noted, she was advised to contact our office for TKO brace.  Dupuytren's contracture Chronic condition that is asymptomatic, involvement at the right third finger, noted over 3 years, gradually progressive, denies any pain at the site.  Physical examination reveals somewhat flexed baseline position of third digit, she does have active flexion and limited extension, there is thickening of the flexor tendon and nodularity, these areas are nontender.   Orders & Medications Meds ordered this encounter  Medications   diclofenac Sodium (VOLTAREN) 1 % GEL    Sig: Apply 2 g topically in the morning, at noon, and at bedtime. To affected joint.    Dispense:  100 g    Refill:  0   No orders of the defined types were placed in this encounter.    Return in about 2 weeks (around 07/18/2021).     07/20/2021, MD   Primary Care Sports Medicine [facility]

## 2021-07-04 NOTE — Patient Instructions (Signed)
-   Use splint throughout the day (okay to remove for handwashing, bathing, medication application, driving, and sleeping) - Apply topical diclofenac 1% (Voltaren gel) three times daily to hand area of pain - Return in 2 weeks - Contact for questions

## 2021-07-04 NOTE — Assessment & Plan Note (Signed)
Right-hand-dominant patient presenting with several week history of right ring finger pain, triggering.  Pain localized to the distal palm, nonradiating, denies any trauma or change in activity at onset but she is highly active with painting.  Physical examination reveals focal tenderness and nodularity at the fourth distal palmar crease region, able to passively flex and extend, though with pain.  Her clinical history and findings are most consistent with trigger finger of the right ring finger, treatment strategies reviewed, and given no treatments to date, I have advised immobilization (splint made for patient today), topical diclofenac 1% 3 times daily x2 weeks, and close follow-up in 2 weeks for reevaluation.  If suboptimal progress noted, can consider ultrasound-guided peritendinous cortisone injection.  Additionally, if splint issues noted, she was advised to contact our office for TKO brace.

## 2021-07-18 ENCOUNTER — Ambulatory Visit (INDEPENDENT_AMBULATORY_CARE_PROVIDER_SITE_OTHER): Payer: Medicare Other | Admitting: Family Medicine

## 2021-07-18 ENCOUNTER — Inpatient Hospital Stay: Payer: Self-pay | Admitting: Radiology

## 2021-07-18 ENCOUNTER — Encounter: Payer: Self-pay | Admitting: Family Medicine

## 2021-07-18 ENCOUNTER — Other Ambulatory Visit: Payer: Self-pay

## 2021-07-18 VITALS — BP 120/88 | HR 102 | Ht 63.0 in | Wt 114.0 lb

## 2021-07-18 DIAGNOSIS — M720 Palmar fascial fibromatosis [Dupuytren]: Secondary | ICD-10-CM | POA: Diagnosis not present

## 2021-07-18 DIAGNOSIS — M65341 Trigger finger, right ring finger: Secondary | ICD-10-CM

## 2021-07-18 MED ORDER — TRIAMCINOLONE ACETONIDE 40 MG/ML IJ SUSP
20.0000 mg | Freq: Once | INTRAMUSCULAR | Status: AC
  Administered 2021-07-18: 20 mg via INTRAMUSCULAR

## 2021-07-18 NOTE — Progress Notes (Signed)
°  ° °  Primary Care / Sports Medicine Office Visit  Patient Information:  Patient ID: Denise Allen, female DOB: 1950-11-04 Age: 71 y.o. MRN: [id number]   Denise Allen is a pleasant 71 y.o. female presenting with the following:  Chief Complaint  Patient presents with   Trigger Finger     Right, the same as last visit, 7 pain scale     Vitals:   07/18/21 1045  BP: 120/88  Pulse: (!) 102  SpO2: 99%   Vitals:   07/18/21 1045  Weight: 114 lb (51.7 kg)  Height: 5\' 3"  (1.6 m)   Body mass index is 20.19 kg/m.  No results found.   Independent interpretation of notes and tests performed by another provider:   None  Procedures performed:   Procedure:  Injection of right third hand digit tendon sheath/trigger finger injection under ultrasound guidance. Ultrasound guidance utilized to visualize the flexor tendon, there is thickening noted just proximal and superficial to the MCP, needle placement confirmed, hypoechoic response from injectate noted Samsung HS60 device utilized with permanent recording / reporting. Verbal informed consent obtained and verified. Skin prepped in a sterile fashion. Ethyl chloride for topical local analgesia.  Completed without difficulty and tolerated well. Medication: triamcinolone acetonide 40 mg/mL suspension for injection 0.5 mL total and 1 mL lidocaine 1% without epinephrine utilized for needle placement anesthetic Advised to contact for fevers/chills, erythema, induration, drainage, or persistent bleeding.   Pertinent History, Exam, Impression, and Recommendations:   Trigger finger, right ring finger Patient presents for follow-up to right fourth trigger finger, ongoing for several weeks, at the last visit she was advised immobilization, topical NSAID.  She has been compliant with her regimen but relays persistent pain essentially unchanged.  Physical examination shows focal tenderness at the volar MCP region of the fourth digit.  As such,  additional treatment strategies were reviewed and she did elect to proceed with ultrasound-guided tendon sheath injection for trigger finger.  Post care reviewed, exercises given for patient to start this weekend, and she can contact for any suboptimal progress at the 2-week mark or beyond.  She can follow-up as needed.  Dupuytren's contracture Chronic condition that is asymptomatic, involving primarily right third digit, secondarily to the right second digit, progressive over 3 years.  I did discuss both surgical and nonsurgical management options including Xiaflex, contracture release through orthopedic hand surgery.  At this stage she is amenable to further evaluation of these options by hand surgeon, a referral was placed in that regard today.  We will follow peripherally on this issue.   Orders & Medications Meds ordered this encounter  Medications   triamcinolone acetonide (KENALOG-40) injection 20 mg   Orders Placed This Encounter  Procedures   [place] LIMITED JOINT SPACE STRUCTURES UP RIGHT   Ambulatory referral to Orthopedic Surgery     Return if symptoms worsen or fail to improve.     [place], MD   Primary Care Sports Medicine [facility]

## 2021-07-18 NOTE — Patient Instructions (Signed)
You have just been given a cortisone injection to reduce pain and inflammation. After the injection you may notice immediate relief of pain as a result of the Lidocaine. It is important to rest the area of the injection for 24 to 48 hours after the injection. There is a possibility of some temporary increased discomfort and swelling for up to 72 hours until the cortisone begins to work. If you do have pain, simply rest the joint and use ice. If you can tolerate over the counter medications, you can try Tylenol, Aleve, or Advil for added relief per package instructions. - Start exercises this weekend and continue regularly - Referral coordinator will contact you for scheduling a visit with hand surgeon - Follow-up as-needed

## 2021-07-18 NOTE — Assessment & Plan Note (Signed)
Patient presents for follow-up to right fourth trigger finger, ongoing for several weeks, at the last visit she was advised immobilization, topical NSAID.  She has been compliant with her regimen but relays persistent pain essentially unchanged.  Physical examination shows focal tenderness at the volar MCP region of the fourth digit.  As such, additional treatment strategies were reviewed and she did elect to proceed with ultrasound-guided tendon sheath injection for trigger finger.  Post care reviewed, exercises given for patient to start this weekend, and she can contact us for any suboptimal progress at the 2-week mark or beyond.  She can follow-up as needed.

## 2021-07-18 NOTE — Assessment & Plan Note (Signed)
Chronic condition that is asymptomatic, involving primarily right third digit, secondarily to the right second digit, progressive over 3 years.  I did discuss both surgical and nonsurgical management options including Xiaflex, contracture release through orthopedic hand surgery.  At this stage she is amenable to further evaluation of these options by hand surgeon, a referral was placed in that regard today.  We will follow peripherally on this issue.

## 2021-07-23 ENCOUNTER — Other Ambulatory Visit: Payer: Self-pay

## 2021-07-23 ENCOUNTER — Ambulatory Visit: Payer: Medicare Other | Admitting: Orthopedic Surgery

## 2021-07-23 ENCOUNTER — Ambulatory Visit: Payer: Self-pay

## 2021-07-23 DIAGNOSIS — M65321 Trigger finger, right index finger: Secondary | ICD-10-CM | POA: Insufficient documentation

## 2021-07-23 DIAGNOSIS — M65341 Trigger finger, right ring finger: Secondary | ICD-10-CM | POA: Diagnosis not present

## 2021-07-23 DIAGNOSIS — M79641 Pain in right hand: Secondary | ICD-10-CM | POA: Diagnosis not present

## 2021-07-23 NOTE — Progress Notes (Signed)
Office Visit Note   Patient: Denise Allen           Date of Birth: 03/22/1951           MRN: [id number] Visit Date: 07/23/2021              Requested by: Jerrol Banana, MD [address] [place],  [place] [address] PCP: Gracelyn Nurse, MD   Assessment & Plan: Visit Diagnoses:  1. Pain of right hand   2. Trigger finger, right ring finger   3. Trigger finger, right index finger     Plan: We reviewed the nature of both trigger finger and Dupuytren's disease.  She recently underwent corticosteroid injection into the right ring finger A1 pulley.  She notes that this is improved her symptoms somewhat but she still has palpable and visible triggering.  She also complains of decreased range of motion of her middle finger.  This has been going on for years now.  She thought maybe it was related to her Dupuytren's disease in her palm.  There is no palpable cord distal to the palm that would contribute to her middle finger decreased range of motion.  Her biggest issue is actually decreased flexion of the finger which is also not the issue with Dupuytren's disease.  She likely has some element of arthritis in this middle finger PIP joint and could have injured the finger in the remote past.  Discussed that she can return the office if her trigger fingers fail to improve after corticosteroid injection or her Dupuytren's disease in the palm worsens.  Follow-Up Instructions: No follow-ups on file.   Orders:  Orders Placed This Encounter  Procedures   XR Hand Complete Right   No orders of the defined types were placed in this encounter.     Procedures: No procedures performed   Clinical Data: No additional findings.   Subjective: Chief Complaint  Patient presents with   Right Hand - Pain    Dupuytrens contracture of the index and middle fingers and trigger finger in right ring finger, Dr. Ashley Royalty did an injection for trigger finger x 1 week ago and it has helped    This  is a 71 year old right-hand-dominant female who presents with several issues involving the right hand.  She notes that she is got triggering of her right ring finger and index finger for several years now.  She recently underwent corticosteroid injection of the right ring finger and the helped some.  She still has palpable and visible triggering of both fingers.  She also describes decreased range of motion of this middle finger for quite some time.  She denies any injury to the finger that she is aware of.  She has trouble gripping things or doing arts and crafts secondary to decreased range of motion of the PIP joint.  She has no pain in her hand today.   Review of Systems   Objective: Vital Signs: There were no vitals taken for this visit.  Physical Exam Constitutional:      Appearance: Normal appearance.  Cardiovascular:     Rate and Rhythm: Normal rate.     Pulses: Normal pulses.  Pulmonary:     Effort: Pulmonary effort is normal.  Skin:    General: Skin is warm and dry.     Capillary Refill: Capillary refill takes less than 2 seconds.  Neurological:     Mental Status: She is alert.    Right Hand Exam  Tenderness  Right hand tenderness location: TTP at ring and index finger A1 pulleys.  No TTP at middle finger PIP joint.  Other  Erythema: absent Sensation: normal Pulse: present  Comments:  Visible and palpable pre-tendinous cords at mid aspect of palm. No extension of cords beyond palm.  No palpable cord going to middle finger PIP joint.  Middle finger PIP ROM from 15-70 degrees with pain on terminal flexion.  Palpable and visible triggering of the index and ring fingers.      Specialty Comments:  No specialty comments available.  Imaging: No results found.   PMFS History: Patient Active Problem List   Diagnosis Date Noted   Trigger finger, right index finger 07/23/2021   Trigger finger, right ring finger 07/04/2021   Personal history of tobacco use,  presenting hazards to health 08/27/2018   Dupuytren's disease of palm 04/28/2017   Bilateral hand pain 04/10/2016   Pain in right wrist 04/10/2016   COPD (chronic obstructive pulmonary disease) (HCC) 05/09/2014   Hypothyroidism 05/09/2014   Past Medical History:  Diagnosis Date   COPD (chronic obstructive pulmonary disease) (HCC)    Dupuytren contracture    Hypothyroidism     Family History  Problem Relation Age of Onset   Cervical cancer Mother    Thyroid disease Mother    Prostate cancer Father    Cerebral aneurysm Sister     Past Surgical History:  Procedure Laterality Date   BREAST EXCISIONAL BIOPSY Left 90s   benign   COLONOSCOPY  07/02/2007   COLONOSCOPY N/A 05/02/2021   Procedure: COLONOSCOPY;  Surgeon: Toledo, Boykin Nearing, MD;  Location: [facility] ENDOSCOPY;  Service: Gastroenterology;  Laterality: N/A;   MASTECTOMY PARTIAL / LUMPECTOMY Left    benign   TUBAL LIGATION  1976   Social History   Occupational History   Not on file  Tobacco Use   Smoking status: Every Day    Packs/day: 1.00    Years: 53.00    Pack years: 53.00    Types: Cigarettes   Smokeless tobacco: Never  Vaping Use   Vaping Use: Never used  Substance and Sexual Activity   Alcohol use: Yes    Comment: social   Drug use: Never   Sexual activity: Yes

## 2021-09-10 ENCOUNTER — Ambulatory Visit (INDEPENDENT_AMBULATORY_CARE_PROVIDER_SITE_OTHER): Payer: Medicare Other | Admitting: Family Medicine

## 2021-09-10 ENCOUNTER — Other Ambulatory Visit: Payer: Self-pay

## 2021-09-10 ENCOUNTER — Inpatient Hospital Stay (INDEPENDENT_AMBULATORY_CARE_PROVIDER_SITE_OTHER): Payer: Medicare Other | Admitting: Radiology

## 2021-09-10 ENCOUNTER — Encounter: Payer: Self-pay | Admitting: Family Medicine

## 2021-09-10 VITALS — BP 144/88 | HR 41 | Ht 63.0 in | Wt 115.0 lb

## 2021-09-10 DIAGNOSIS — M654 Radial styloid tenosynovitis [de Quervain]: Secondary | ICD-10-CM | POA: Diagnosis not present

## 2021-09-10 MED ORDER — TRIAMCINOLONE ACETONIDE 40 MG/ML IJ SUSP
40.0000 mg | Freq: Once | INTRAMUSCULAR | Status: AC
  Administered 2021-09-10: 40 mg via INTRAMUSCULAR

## 2021-09-10 NOTE — Patient Instructions (Signed)
You have just been given a cortisone injection to reduce pain and inflammation. After the injection you may notice immediate relief of pain as a result of the Lidocaine. It is important to rest the area of the injection for 24 to 48 hours after the injection. There is a possibility of some temporary increased discomfort and swelling for up to 72 hours until the cortisone begins to work. If you do have pain, simply rest the joint and use ice. If you can tolerate over the counter medications, you can try Tylenol, Aleve, or Advil for added relief per package instructions. ?- As above, relative rest x2 days then gradual return to normal activity ?- Can adjust Ace wrap for comfort/circulation, remove at bedtime ?- Start home exercises next week, focus on slow and steady progression ?- Contact us for questions and follow-up as needed ?

## 2021-09-10 NOTE — Progress Notes (Signed)
?  ? ?  Primary Care / Sports Medicine Office Visit ? ?Patient Information:  ?Patient ID: Denise Allen, female DOB: [birth year] Age: 71 y.o. MRN: [id number]  ? ?Denise Allen is a pleasant 71 y.o. female presenting with the following: ? ?Chief Complaint  ?Patient presents with  ? Hand Pain  ?  Left wrist and left thumb pain   ? ? ?Vitals:  ? 09/10/21 0933  ?BP: (!) 144/88  ?Pulse: (!) 41  ?SpO2: 97%  ? ?Vitals:  ? 09/10/21 0933  ?Weight: 115 lb (52.2 kg)  ?Height: 5\' 3"  (1.6 m)  ? ?Body mass index is 20.37 kg/m?. ? ?No results found.  ? ?Independent interpretation of notes and tests performed by another provider:  ? ?None ? ?Procedures performed:  ? ?Procedure:  Injection of left wrist radial styloid under ultrasound guidance. ?Ultrasound guidance utilized for out-of-plane approach to the radial styloid, dynamic tendon motion noted, subtle thickening of the tendon just proximal to the radial styloid consistent with tendinopathy/synovitis ?Samsung HS60 device utilized with permanent recording / reporting. ?Verbal informed consent obtained and verified. ?Skin prepped in a sterile fashion. ?Ethyl chloride for topical local analgesia.  ?Completed without difficulty and tolerated well. ?Medication: triamcinolone acetonide 40 mg/mL suspension for injection 1 mL total and 2 mL lidocaine 1% without epinephrine utilized for needle placement anesthetic ?Advised to contact for fevers/chills, erythema, induration, drainage, or persistent bleeding. ? ?Pertinent History, Exam, Impression, and Recommendations:  ? ?Radial styloid tenosynovitis of left hand ?Right-hand-dominant patient presenting with few week history of left radial wrist/thumb pain, atraumatic in onset.  Denies any change in activity proceeding these events.  Pain localized to the radial aspect of her wrist, aggravated with simple ADLs, reaching, grabbing, etc.  Denies any paresthesias, no swelling, treatments to date have included Voltaren gel, rest. ? ?Examination  reveals no abnormalities to inspection, full, painful range of motion primarily with radial deviation and ulnar deviation, grasping, focal tenderness that recreates her pain at the radial styloid, positive Finkelstein, nontender at the first [facility], MCPs of the volar aspect throughout, sensorimotor intact. ? ?Clinical history and findings are most consistent with De Quervain's tenosynovitis, given treatments to date, findings today, additional treatments were outlined and she did elect to proceed with ultrasound-guided corticosteroid injection at the radial styloid.  Post care reviewed, home exercises provided for patient to start next week.  She can follow-up on as-needed basis for this issue. ?  ? ?Orders & Medications ?No orders of the defined types were placed in this encounter. ? ?Orders Placed This Encounter  ?Procedures  ? [facility] LIMITED JOINT SPACE STRUCTURES UP LEFT  ?  ? ?Return if symptoms worsen or fail to improve.  ?  ? ?[place], MD ? ? Primary Care Sports Medicine ?[facility] ?[facility]  ? ?

## 2021-09-10 NOTE — Assessment & Plan Note (Signed)
Right-hand-dominant patient presenting with few week history of left radial wrist/thumb pain, atraumatic in onset.  Denies any change in activity proceeding these events.  Pain localized to the radial aspect of her wrist, aggravated with simple ADLs, reaching, grabbing, etc.  Denies any paresthesias, no swelling, treatments to date have included Voltaren gel, rest. ? ?Examination reveals no abnormalities to inspection, full, painful range of motion primarily with radial deviation and ulnar deviation, grasping, focal tenderness that recreates her pain at the radial styloid, positive Finkelstein, nontender at the first [facility], MCPs of the volar aspect throughout, sensorimotor intact. ? ?Clinical history and findings are most consistent with De Quervain's tenosynovitis, given treatments to date, findings today, additional treatments were outlined and she did elect to proceed with ultrasound-guided corticosteroid injection at the radial styloid.  Post care reviewed, home exercises provided for patient to start next week.  She can follow-up on as-needed basis for this issue. ?

## 2021-09-13 ENCOUNTER — Other Ambulatory Visit: Payer: Self-pay | Admitting: *Deleted

## 2021-09-28 ENCOUNTER — Ambulatory Visit
Admission: RE | Admit: 2021-09-28 | Discharge: 2021-09-28 | Disposition: A | Discharge status: Home / Self Care | Payer: Medicare Other | Source: Ambulatory Visit | Attending: Acute Care | Admitting: Acute Care

## 2021-09-28 DIAGNOSIS — J439 Emphysema, unspecified: Secondary | ICD-10-CM | POA: Insufficient documentation

## 2021-09-28 DIAGNOSIS — Z87891 Personal history of nicotine dependence: Secondary | ICD-10-CM

## 2021-09-28 DIAGNOSIS — F17210 Nicotine dependence, cigarettes, uncomplicated: Secondary | ICD-10-CM | POA: Diagnosis not present

## 2021-09-28 DIAGNOSIS — I700 Atherosclerosis of aorta: Secondary | ICD-10-CM | POA: Diagnosis not present

## 2021-09-28 DIAGNOSIS — N200 Calculus of kidney: Secondary | ICD-10-CM | POA: Diagnosis not present

## 2021-09-28 DIAGNOSIS — Z122 Encounter for screening for malignant neoplasm of respiratory organs: Secondary | ICD-10-CM | POA: Insufficient documentation

## 2021-09-28 DIAGNOSIS — N281 Cyst of kidney, acquired: Secondary | ICD-10-CM | POA: Diagnosis not present

## 2021-10-01 ENCOUNTER — Other Ambulatory Visit: Payer: Self-pay | Admitting: Acute Care

## 2021-10-01 DIAGNOSIS — Z87891 Personal history of nicotine dependence: Secondary | ICD-10-CM

## 2021-10-01 DIAGNOSIS — F17210 Nicotine dependence, cigarettes, uncomplicated: Secondary | ICD-10-CM

## 2021-10-10 ENCOUNTER — Other Ambulatory Visit: Payer: Self-pay | Admitting: Internal Medicine

## 2021-10-10 DIAGNOSIS — Z1231 Encounter for screening mammogram for malignant neoplasm of breast: Secondary | ICD-10-CM

## 2021-11-22 ENCOUNTER — Ambulatory Visit
Admission: RE | Admit: 2021-11-22 | Discharge: 2021-11-22 | Disposition: A | Discharge status: Home / Self Care | Payer: Medicare Other | Source: Ambulatory Visit | Attending: Internal Medicine | Admitting: Internal Medicine

## 2021-11-22 DIAGNOSIS — Z1231 Encounter for screening mammogram for malignant neoplasm of breast: Secondary | ICD-10-CM | POA: Diagnosis present

## 2022-05-31 DIAGNOSIS — I509 Heart failure, unspecified: Secondary | ICD-10-CM

## 2022-05-31 HISTORY — DX: Heart failure, unspecified: I50.9

## 2022-06-12 ENCOUNTER — Inpatient Hospital Stay
Admission: EM | Admit: 2022-06-12 | Discharge: 2022-06-15 | DRG: 190 | Disposition: A | Discharge status: Home / Self Care | Payer: Medicare Other | Attending: Internal Medicine | Admitting: Internal Medicine

## 2022-06-12 ENCOUNTER — Inpatient Hospital Stay
Admit: 2022-06-12 | Discharge: 2022-06-12 | Disposition: A | Discharge status: Home / Self Care | Payer: Medicare Other | Attending: Internal Medicine | Admitting: Internal Medicine

## 2022-06-12 ENCOUNTER — Encounter: Payer: Self-pay | Admitting: Emergency Medicine

## 2022-06-12 ENCOUNTER — Emergency Department: Payer: Medicare Other

## 2022-06-12 DIAGNOSIS — Z880 Allergy status to penicillin: Secondary | ICD-10-CM

## 2022-06-12 DIAGNOSIS — Z8349 Family history of other endocrine, nutritional and metabolic diseases: Secondary | ICD-10-CM | POA: Diagnosis not present

## 2022-06-12 DIAGNOSIS — Z720 Tobacco use: Secondary | ICD-10-CM | POA: Diagnosis not present

## 2022-06-12 DIAGNOSIS — I959 Hypotension, unspecified: Secondary | ICD-10-CM | POA: Diagnosis present

## 2022-06-12 DIAGNOSIS — Z823 Family history of stroke: Secondary | ICD-10-CM | POA: Diagnosis not present

## 2022-06-12 DIAGNOSIS — Z66 Do not resuscitate: Secondary | ICD-10-CM | POA: Diagnosis present

## 2022-06-12 DIAGNOSIS — Z7951 Long term (current) use of inhaled steroids: Secondary | ICD-10-CM | POA: Diagnosis not present

## 2022-06-12 DIAGNOSIS — Z9012 Acquired absence of left breast and nipple: Secondary | ICD-10-CM

## 2022-06-12 DIAGNOSIS — J441 Chronic obstructive pulmonary disease with (acute) exacerbation: Principal | ICD-10-CM | POA: Diagnosis present

## 2022-06-12 DIAGNOSIS — I509 Heart failure, unspecified: Secondary | ICD-10-CM | POA: Diagnosis not present

## 2022-06-12 DIAGNOSIS — R0902 Hypoxemia: Secondary | ICD-10-CM

## 2022-06-12 DIAGNOSIS — I21A1 Myocardial infarction type 2: Secondary | ICD-10-CM | POA: Diagnosis present

## 2022-06-12 DIAGNOSIS — E785 Hyperlipidemia, unspecified: Secondary | ICD-10-CM | POA: Diagnosis present

## 2022-06-12 DIAGNOSIS — Z79899 Other long term (current) drug therapy: Secondary | ICD-10-CM | POA: Diagnosis not present

## 2022-06-12 DIAGNOSIS — Z79890 Hormone replacement therapy (postmenopausal): Secondary | ICD-10-CM

## 2022-06-12 DIAGNOSIS — N179 Acute kidney failure, unspecified: Secondary | ICD-10-CM | POA: Diagnosis present

## 2022-06-12 DIAGNOSIS — I5031 Acute diastolic (congestive) heart failure: Secondary | ICD-10-CM | POA: Diagnosis present

## 2022-06-12 DIAGNOSIS — E876 Hypokalemia: Secondary | ICD-10-CM | POA: Diagnosis present

## 2022-06-12 DIAGNOSIS — Z716 Tobacco abuse counseling: Secondary | ICD-10-CM | POA: Diagnosis not present

## 2022-06-12 DIAGNOSIS — E039 Hypothyroidism, unspecified: Secondary | ICD-10-CM | POA: Diagnosis present

## 2022-06-12 DIAGNOSIS — J9601 Acute respiratory failure with hypoxia: Secondary | ICD-10-CM | POA: Diagnosis present

## 2022-06-12 DIAGNOSIS — Z1152 Encounter for screening for COVID-19: Secondary | ICD-10-CM | POA: Diagnosis not present

## 2022-06-12 DIAGNOSIS — R0603 Acute respiratory distress: Secondary | ICD-10-CM

## 2022-06-12 DIAGNOSIS — A419 Sepsis, unspecified organism: Secondary | ICD-10-CM

## 2022-06-12 DIAGNOSIS — D649 Anemia, unspecified: Secondary | ICD-10-CM | POA: Diagnosis present

## 2022-06-12 DIAGNOSIS — F17210 Nicotine dependence, cigarettes, uncomplicated: Secondary | ICD-10-CM | POA: Diagnosis present

## 2022-06-12 DIAGNOSIS — M720 Palmar fascial fibromatosis [Dupuytren]: Secondary | ICD-10-CM | POA: Diagnosis present

## 2022-06-12 DIAGNOSIS — I214 Non-ST elevation (NSTEMI) myocardial infarction: Secondary | ICD-10-CM

## 2022-06-12 LAB — CBC
HCT: 32.9 % — ABNORMAL LOW (ref 36.0–46.0)
HCT: 31.1 % — ABNORMAL LOW (ref 36.0–46.0)
Hemoglobin: 10.4 g/dL — ABNORMAL LOW (ref 12.0–15.0)
Hemoglobin: 9.9 g/dL — ABNORMAL LOW (ref 12.0–15.0)
MCH: 29.5 pg (ref 26.0–34.0)
MCH: 29.6 pg (ref 26.0–34.0)
MCHC: 31.6 g/dL (ref 30.0–36.0)
MCHC: 31.8 g/dL (ref 30.0–36.0)
MCV: 93.5 fL (ref 80.0–100.0)
MCV: 92.8 fL (ref 80.0–100.0)
Platelets: 431 10*3/uL — ABNORMAL HIGH (ref 150–400)
Platelets: 397 10*3/uL (ref 150–400)
RBC: 3.52 MIL/uL — ABNORMAL LOW (ref 3.87–5.11)
RBC: 3.35 MIL/uL — ABNORMAL LOW (ref 3.87–5.11)
RDW: 13.2 % (ref 11.5–15.5)
RDW: 13.2 % (ref 11.5–15.5)
WBC: 8.6 10*3/uL (ref 4.0–10.5)
WBC: 8.1 10*3/uL (ref 4.0–10.5)
nRBC: 0 % (ref 0.0–0.2)
nRBC: 0 % (ref 0.0–0.2)

## 2022-06-12 LAB — CBC WITH DIFFERENTIAL/PLATELET
Abs Immature Granulocytes: 0.09 10*3/uL — ABNORMAL HIGH (ref 0.00–0.07)
Basophils Absolute: 0.1 10*3/uL (ref 0.0–0.1)
Basophils Relative: 1 %
Eosinophils Absolute: 0.2 10*3/uL (ref 0.0–0.5)
Eosinophils Relative: 1 %
HCT: 33 % — ABNORMAL LOW (ref 36.0–46.0)
Hemoglobin: 10.7 g/dL — ABNORMAL LOW (ref 12.0–15.0)
Immature Granulocytes: 1 %
Lymphocytes Relative: 22 %
Lymphs Abs: 3.5 10*3/uL (ref 0.7–4.0)
MCH: 30.5 pg (ref 26.0–34.0)
MCHC: 32.4 g/dL (ref 30.0–36.0)
MCV: 94 fL (ref 80.0–100.0)
Monocytes Absolute: 1.2 10*3/uL — ABNORMAL HIGH (ref 0.1–1.0)
Monocytes Relative: 7 %
Neutro Abs: 11.1 10*3/uL — ABNORMAL HIGH (ref 1.7–7.7)
Neutrophils Relative %: 68 %
Platelets: 429 10*3/uL — ABNORMAL HIGH (ref 150–400)
RBC: 3.51 MIL/uL — ABNORMAL LOW (ref 3.87–5.11)
RDW: 13.2 % (ref 11.5–15.5)
WBC: 16.1 10*3/uL — ABNORMAL HIGH (ref 4.0–10.5)
nRBC: 0 % (ref 0.0–0.2)

## 2022-06-12 LAB — COMPREHENSIVE METABOLIC PANEL
ALT: 21 U/L (ref 0–44)
AST: 38 U/L (ref 15–41)
Albumin: 3.5 g/dL (ref 3.5–5.0)
Alkaline Phosphatase: 72 U/L (ref 38–126)
Anion gap: 9 (ref 5–15)
BUN: 16 mg/dL (ref 8–23)
CO2: 24 mmol/L (ref 22–32)
Calcium: 8.5 mg/dL — ABNORMAL LOW (ref 8.9–10.3)
Chloride: 107 mmol/L (ref 98–111)
Creatinine, Ser: 0.77 mg/dL (ref 0.44–1.00)
GFR, Estimated: >60 mL/min (ref >60)
Glucose, Bld: 160 mg/dL — ABNORMAL HIGH (ref 70–99)
Potassium: 2.9 mmol/L — ABNORMAL LOW (ref 3.5–5.1)
Sodium: 140 mmol/L (ref 135–145)
Total Bilirubin: 0.9 mg/dL (ref 0.3–1.2)
Total Protein: 6.5 g/dL (ref 6.5–8.1)

## 2022-06-12 LAB — RESP PANEL BY RT-PCR (RSV, FLU A&B, COVID)  RVPGX2
Influenza A by PCR: NEGATIVE
Influenza B by PCR: NEGATIVE
Resp Syncytial Virus by PCR: NEGATIVE
SARS Coronavirus 2 by RT PCR: NEGATIVE

## 2022-06-12 LAB — HEPARIN LEVEL (UNFRACTIONATED): Heparin Unfractionated: <0.1 IU/mL — ABNORMAL LOW (ref 0.30–0.70)

## 2022-06-12 LAB — RETICULOCYTES
Immature Retic Fract: 13.1 % (ref 2.3–15.9)
RBC.: 3.56 MIL/uL — ABNORMAL LOW (ref 3.87–5.11)
Retic Count, Absolute: 78 10*3/uL (ref 19.0–186.0)
Retic Ct Pct: 2.2 % (ref 0.4–3.1)

## 2022-06-12 LAB — VITAMIN B12: Vitamin B-12: 142 pg/mL — ABNORMAL LOW (ref 180–914)

## 2022-06-12 LAB — IRON AND TIBC
Iron: 22 ug/dL — ABNORMAL LOW (ref 28–170)
Saturation Ratios: 7 % — ABNORMAL LOW (ref 10.4–31.8)
TIBC: 297 ug/dL (ref 250–450)
UIBC: 275 ug/dL

## 2022-06-12 LAB — LACTIC ACID, PLASMA
Lactic Acid, Venous: 1.8 mmol/L (ref 0.5–1.9)
Lactic Acid, Venous: 2.5 mmol/L (ref 0.5–1.9)

## 2022-06-12 LAB — FERRITIN: Ferritin: 39 ng/mL (ref 11–307)

## 2022-06-12 LAB — BRAIN NATRIURETIC PEPTIDE: B Natriuretic Peptide: 1015.7 pg/mL — ABNORMAL HIGH (ref 0.0–100.0)

## 2022-06-12 LAB — PROCALCITONIN: Procalcitonin: <0.1 ng/mL

## 2022-06-12 LAB — PROTIME-INR
INR: 1.2 (ref 0.8–1.2)
Prothrombin Time: 14.7 seconds (ref 11.4–15.2)

## 2022-06-12 LAB — TROPONIN I (HIGH SENSITIVITY)
Troponin I (High Sensitivity): 813 ng/L (ref <18)
Troponin I (High Sensitivity): 745 ng/L (ref <18)
Troponin I (High Sensitivity): 611 ng/L (ref <18)

## 2022-06-12 LAB — FOLATE: Folate: 6.2 ng/mL (ref >5.9)

## 2022-06-12 LAB — HEMOGLOBIN A1C
Hgb A1c MFr Bld: 5.4 % (ref 4.8–5.6)
Mean Plasma Glucose: 108 mg/dL

## 2022-06-12 LAB — APTT: aPTT: 35 seconds (ref 24–36)

## 2022-06-12 LAB — MAGNESIUM: Magnesium: 1.9 mg/dL (ref 1.7–2.4)

## 2022-06-12 MED ORDER — AZITHROMYCIN 250 MG PO TABS
250.0000 mg | ORAL_TABLET | Freq: Every day | ORAL | Status: DC
  Administered 2022-06-13 – 2022-06-15 (×3): 250 mg via ORAL
  Filled 2022-06-12 (×3): qty 1

## 2022-06-12 MED ORDER — METHYLPREDNISOLONE SODIUM SUCC 40 MG IJ SOLR
40.0000 mg | Freq: Two times a day (BID) | INTRAMUSCULAR | Status: DC
  Administered 2022-06-12 – 2022-06-15 (×6): 40 mg via INTRAVENOUS
  Filled 2022-06-12 (×6): qty 1

## 2022-06-12 MED ORDER — POTASSIUM CHLORIDE CRYS ER 20 MEQ PO TBCR
40.0000 meq | EXTENDED_RELEASE_TABLET | Freq: Once | ORAL | Status: AC
  Administered 2022-06-12: 40 meq via ORAL
  Filled 2022-06-12: qty 2

## 2022-06-12 MED ORDER — POTASSIUM CHLORIDE CRYS ER 20 MEQ PO TBCR
60.0000 meq | EXTENDED_RELEASE_TABLET | Freq: Once | ORAL | Status: AC
  Administered 2022-06-12: 60 meq via ORAL
  Filled 2022-06-12: qty 3

## 2022-06-12 MED ORDER — AZITHROMYCIN 500 MG PO TABS
500.0000 mg | ORAL_TABLET | Freq: Every day | ORAL | Status: AC
  Administered 2022-06-12: 500 mg via ORAL
  Filled 2022-06-12: qty 1

## 2022-06-12 MED ORDER — FUROSEMIDE 10 MG/ML IJ SOLN
20.0000 mg | Freq: Two times a day (BID) | INTRAMUSCULAR | Status: DC
  Filled 2022-06-12: qty 4

## 2022-06-12 MED ORDER — ONDANSETRON HCL 4 MG/2ML IJ SOLN: 4.0000 mg | Freq: Three times a day (TID) | INTRAMUSCULAR | Status: DC | PRN

## 2022-06-12 MED ORDER — DM-GUAIFENESIN ER 30-600 MG PO TB12
1.0000 | ORAL_TABLET | Freq: Two times a day (BID) | ORAL | Status: DC | PRN
  Administered 2022-06-14 – 2022-06-15 (×2): 1 via ORAL
  Filled 2022-06-12 (×3): qty 1

## 2022-06-12 MED ORDER — FUROSEMIDE 10 MG/ML IJ SOLN
20.0000 mg | Freq: Once | INTRAMUSCULAR | Status: AC
  Administered 2022-06-12: 20 mg via INTRAVENOUS

## 2022-06-12 MED ORDER — HEPARIN SODIUM (PORCINE) 5000 UNIT/ML IJ SOLN: 60.0000 [IU]/kg | Freq: Once | INTRAMUSCULAR | Status: DC

## 2022-06-12 MED ORDER — ASPIRIN 81 MG PO TBEC
81.0000 mg | DELAYED_RELEASE_TABLET | Freq: Every day | ORAL | Status: DC
  Administered 2022-06-13 – 2022-06-15 (×3): 81 mg via ORAL
  Filled 2022-06-12 (×3): qty 1

## 2022-06-12 MED ORDER — NICOTINE 21 MG/24HR TD PT24
21.0000 mg | MEDICATED_PATCH | Freq: Every day | TRANSDERMAL | Status: DC
  Administered 2022-06-12 – 2022-06-15 (×4): 21 mg via TRANSDERMAL
  Filled 2022-06-12 (×6): qty 1

## 2022-06-12 MED ORDER — DICLOFENAC SODIUM 1 % EX GEL: 2.0000 g | Freq: Four times a day (QID) | CUTANEOUS | Status: DC | PRN

## 2022-06-12 MED ORDER — LORAZEPAM 2 MG/ML IJ SOLN
0.5000 mg | Freq: Once | INTRAMUSCULAR | Status: AC
  Administered 2022-06-12: 0.5 mg via INTRAVENOUS
  Filled 2022-06-12: qty 1

## 2022-06-12 MED ORDER — ACETAMINOPHEN 325 MG PO TABS
650.0000 mg | ORAL_TABLET | Freq: Four times a day (QID) | ORAL | Status: DC | PRN
  Administered 2022-06-13 – 2022-06-14 (×2): 650 mg via ORAL
  Filled 2022-06-12 (×2): qty 2

## 2022-06-12 MED ORDER — ASPIRIN 81 MG PO CHEW
324.0000 mg | CHEWABLE_TABLET | Freq: Once | ORAL | Status: AC
  Administered 2022-06-12: 324 mg via ORAL
  Filled 2022-06-12: qty 4

## 2022-06-12 MED ORDER — HYDRALAZINE HCL 20 MG/ML IJ SOLN: 5.0000 mg | INTRAMUSCULAR | Status: DC | PRN

## 2022-06-12 MED ORDER — NITROGLYCERIN 0.4 MG SL SUBL: 0.4000 mg | SUBLINGUAL_TABLET | SUBLINGUAL | Status: DC | PRN

## 2022-06-12 MED ORDER — MOMETASONE FURO-FORMOTEROL FUM 100-5 MCG/ACT IN AERO
2.0000 | INHALATION_SPRAY | Freq: Two times a day (BID) | RESPIRATORY_TRACT | Status: DC
  Administered 2022-06-12: 2 via RESPIRATORY_TRACT
  Filled 2022-06-12: qty 8.8

## 2022-06-12 MED ORDER — ONDANSETRON HCL 4 MG/2ML IJ SOLN
INTRAMUSCULAR | Status: AC
  Filled 2022-06-12: qty 2

## 2022-06-12 MED ORDER — IPRATROPIUM-ALBUTEROL 0.5-2.5 (3) MG/3ML IN SOLN
3.0000 mL | RESPIRATORY_TRACT | Status: DC
  Administered 2022-06-12 – 2022-06-13 (×8): 3 mL via RESPIRATORY_TRACT
  Filled 2022-06-12 (×9): qty 3

## 2022-06-12 MED ORDER — LEVOTHYROXINE SODIUM 88 MCG PO TABS
88.0000 ug | ORAL_TABLET | Freq: Every day | ORAL | Status: DC
  Administered 2022-06-13 – 2022-06-15 (×3): 88 ug via ORAL
  Filled 2022-06-12 (×4): qty 1

## 2022-06-12 MED ORDER — MAGNESIUM SULFATE 2 GM/50ML IV SOLN
2.0000 g | Freq: Once | INTRAVENOUS | Status: AC
  Administered 2022-06-12: 2 g via INTRAVENOUS
  Filled 2022-06-12: qty 50

## 2022-06-12 MED ORDER — HEPARIN (PORCINE) 25000 UT/250ML-% IV SOLN
1000.0000 [IU]/h | INTRAVENOUS | Status: DC
  Administered 2022-06-12: 700 [IU]/h via INTRAVENOUS
  Administered 2022-06-13: 1000 [IU]/h via INTRAVENOUS
  Filled 2022-06-12 (×2): qty 250

## 2022-06-12 MED ORDER — FUROSEMIDE 10 MG/ML IJ SOLN
20.0000 mg | Freq: Once | INTRAMUSCULAR | Status: AC
  Administered 2022-06-12: 20 mg via INTRAVENOUS
  Filled 2022-06-12: qty 4

## 2022-06-12 MED ORDER — ATORVASTATIN CALCIUM 20 MG PO TABS
40.0000 mg | ORAL_TABLET | Freq: Every day | ORAL | Status: DC
  Administered 2022-06-12 – 2022-06-15 (×4): 40 mg via ORAL
  Filled 2022-06-12 (×4): qty 2

## 2022-06-12 MED ORDER — HEPARIN BOLUS VIA INFUSION
1600.0000 [IU] | Freq: Once | INTRAVENOUS | Status: AC
  Administered 2022-06-12: 1600 [IU] via INTRAVENOUS
  Filled 2022-06-12: qty 1600

## 2022-06-12 MED ORDER — ALBUTEROL SULFATE (2.5 MG/3ML) 0.083% IN NEBU: 2.5000 mg | INHALATION_SOLUTION | RESPIRATORY_TRACT | Status: DC | PRN

## 2022-06-12 MED ORDER — MORPHINE SULFATE (PF) 2 MG/ML IV SOLN: 1.0000 mg | INTRAVENOUS | Status: DC | PRN

## 2022-06-12 MED ORDER — ALBUTEROL SULFATE (2.5 MG/3ML) 0.083% IN NEBU
2.5000 mg | INHALATION_SOLUTION | RESPIRATORY_TRACT | Status: DC | PRN
  Administered 2022-06-12: 2.5 mg via RESPIRATORY_TRACT
  Filled 2022-06-12: qty 3

## 2022-06-12 MED ORDER — ONDANSETRON HCL 4 MG/2ML IJ SOLN
4.0000 mg | Freq: Once | INTRAMUSCULAR | Status: AC
  Administered 2022-06-12: 4 mg via INTRAVENOUS

## 2022-06-12 MED ORDER — HEPARIN BOLUS VIA INFUSION
3200.0000 [IU] | Freq: Once | INTRAVENOUS | Status: AC
  Administered 2022-06-12: 3200 [IU] via INTRAVENOUS
  Filled 2022-06-12: qty 3200

## 2022-06-12 MED ORDER — HEPARIN (PORCINE) 25000 UT/250ML-% IV SOLN: 14.0000 [IU]/kg/h | INTRAVENOUS | Status: DC

## 2022-06-12 NOTE — ED Notes (Signed)
Pt given a cup of coffee. 

## 2022-06-12 NOTE — ED Notes (Signed)
Bi-pap replaced

## 2022-06-12 NOTE — ED Notes (Signed)
Pt resting in bed.

## 2022-06-12 NOTE — ED Notes (Addendum)
RT at the bedside to apply BiPAP.

## 2022-06-12 NOTE — Progress Notes (Signed)
*  PRELIMINARY RESULTS* Echocardiogram 2D Echocardiogram has been performed.  Denise Gula Isaish Allen 06/12/2022, 2:17 PM

## 2022-06-12 NOTE — Progress Notes (Signed)
ANTICOAGULATION CONSULT NOTE  Pharmacy Consult for heparin infusion Indication: ACS/STEMI  Allergies  Allergen Reactions   Penicillins Other (See Comments)    Patient states "NOT ALLERGIC" causes severe yeast infection. Prefers NOT to use it.    Patient Measurements: Height: 5\' 3"  (160 cm) Weight: 53.5 kg (118 lb) IBW/kg (Calculated) : 52.4 Heparin Dosing Weight: 53.5 kg  Vital Signs: Temp: 99.1 F (37.3 C) (12/13 1019) Temp Source: Oral (12/13 1019) BP: 96/69 (12/13 1230) Pulse Rate: 88 (12/13 1300)  Labs: Recent Labs    06/12/22 0403 06/12/22 0558 06/12/22 0950 06/12/22 1300  HGB 10.7*  --   --  10.4*  HCT 33.0*  --   --  32.9*  PLT 429*  --   --  431*  APTT  --  35  --   --   LABPROT  --  14.7  --   --   INR  --  1.2  --   --   HEPARINUNFRC  --   --   --  <0.10*  CREATININE 0.77  --   --   --   TROPONINIHS 813* 745* 611*  --      Estimated Creatinine Clearance: 51.8 mL/min (by C-G formula based on SCr of 0.77 mg/dL).   Medical History: Past Medical History:  Diagnosis Date   COPD (chronic obstructive pulmonary disease) (HCC)    Dupuytren contracture    Hypothyroidism     Assessment: Pt is a 71 yo female presenting to ED c/o SOB, found with elevated Troponin I lvl.   Goal of Therapy:  Heparin level 0.3-0.7 units/ml Monitor platelets by anticoagulation protocol: Yes  12/13 @ 1300 HL <0.10, Subtherapeutic   Plan:  Bolus 1600 units x 1 Increase heparin infusion to 850 units/hr Will check HL in 8 hr after rate change CBC daily while on heparin  1/14, PharmD 06/12/2022 3:21 PM

## 2022-06-12 NOTE — ED Notes (Signed)
Pt taken off bipap and is "feeling better" in her words. Given water.

## 2022-06-12 NOTE — ED Notes (Signed)
Pt husband came out of room saying pt was SOB and breathing like she was on admission. This RN came to assess. Pt was receiving neb treatment and had audible wheezing and labored breathing. RT called and PRN albuterol given. RT at bedside.

## 2022-06-12 NOTE — ED Notes (Signed)
MD informed of critical lab result

## 2022-06-12 NOTE — ED Triage Notes (Signed)
Pt presents via EMS with complaints of SOB that started last night. Hx of of COPD - 89% on RA. Pt received 2 Duonebs in route & 125 solumedrol with mild improvement. Endorses CP due to the SOB.

## 2022-06-12 NOTE — Progress Notes (Signed)
ANTICOAGULATION CONSULT NOTE  Pharmacy Consult for heparin infusion Indication: ACS/STEMI  Allergies  Allergen Reactions   Penicillins Other (See Comments)    Patient Measurements: Height: 5\' 3"  (160 cm) Weight: 53.5 kg (118 lb) IBW/kg (Calculated) : 52.4 Heparin Dosing Weight: 53.5 kg  Vital Signs: Temp: 98.4 F (36.9 C) (12/13 0405) Temp Source: Oral (12/13 0405) BP: 97/56 (12/13 0500) Pulse Rate: 88 (12/13 0500)  Labs: Recent Labs    06/12/22 0403  HGB 10.7*  HCT 33.0*  PLT 429*  CREATININE 0.77  TROPONINIHS 813*    Estimated Creatinine Clearance: 51.8 mL/min (by C-G formula based on SCr of 0.77 mg/dL).   Medical History: Past Medical History:  Diagnosis Date   COPD (chronic obstructive pulmonary disease) (HCC)    Dupuytren contracture    Hypothyroidism     Assessment: Pt is a 71 yo female presenting to ED c/o SOB, found with elevated Troponin I lvl.  Goal of Therapy:  Heparin level 0.3-0.7 units/ml Monitor platelets by anticoagulation protocol: Yes   Plan:  Bolus 3200 units x 1 Start heparin infusion at 700 units/hr Will check HL in 8 hr after start of infusion CBC daily while on heparin  63, PharmD, [facility] 06/12/2022 5:52 AM

## 2022-06-12 NOTE — Consult Note (Signed)
[facility] CLINIC CARDIOLOGY CONSULT NOTE       Patient ID: Denise Allen MRN: [id number] DOB/AGE: [birth year] 71 y.o.  Admit date: 06/12/2022 Referring Physician Dr. Clyde Lundborg Primary Physician Dr, [name] Primary Cardiologist none Reason for Consultation elevated troponin  HPI: Denise Allen is a 71yoF with a PMH of COPD, ongoing tobacco use, hypothyroidism who presented to [facility] ED the early morning hours of 06/12/22 by EMS with respiratory distress at home.  Reports for shortness of breath started last night, borderline hypoxic to 89% on room air, requiring BiPAP in the emergency department.  Cardiology is consulted for her elevated troponin (813, 745).   Patient presents with her husband who contributes to the history.  She says that 2 days ago she felt increased shortness of breath that was "regulated with her inhalers."  Last night, she felt her breathing was worsening and only somewhat improved with her regular inhaler use, and this morning after she woke up and walked to the bathroom she "just could not catch her breath" and she cannot get comfortable when she tried to lay back in bed, so she called EMS.  She admits to generalized chest tightness describing the sensation of "like a balloon but just could not inflate" that gradually improved after EMS gave her DuoNebs and Solu-Medrol.  She had never felt like this before, nor had this degree of shortness of breath to her knowledge.  She does state that on and off for "years" she has had occasional "pings" in her chest that steal her attention for seconds and self terminate without lingering. Denies any palpitations or heart racing, dizziness, or peripheral edema.  At my time of evaluation she feels "100% better" compared to when she came in, remains on 4 L of oxygen by nasal cannula with none at baseline.  Currently smokes 1 pack/day with at least a 50-pack-year history.  Drinks an average of 2 wine coolers every night.  Denies illicit substance  use.  Family history of CVA in one sister, another sister who required a pacemaker.  The patient has never been seen by a cardiologist in the past, no prior history of stress testing.  EMS gave her 2 DuoNeb's and 125 mg of Solu-Medrol, in the emergency department she was started on BiPAP and was given IV Ativan, IV Lasix, 325mg  aspirin and was started on a heparin infusion.  Vitals notable for hypotension on presentation at 93/62, heart rate in the 80s to low 100s in NSR on telemetry.  She was weaned from BiPAP to 4 L by nasal cannula at my time of evaluation. Labs are notable for leukocytosis with WBCs 16,000, with a slight left shift.  H&H 10.7/33 and platelets 429. K+ is 2.9, BUN/Cr 16/0.77 GFR greater than 60.  BNP elevated at 1115, high-sensitivity troponin elevated with a relative flat trend at 813, 745 with repeats pending.  Negative procalcitonin less than 0.10  Review of systems complete and found to be negative unless listed above     Past Medical History:  Diagnosis Date   COPD (chronic obstructive pulmonary disease) (HCC)    Dupuytren contracture    Hypothyroidism     Past Surgical History:  Procedure Laterality Date   BREAST EXCISIONAL BIOPSY Left 90s   benign   COLONOSCOPY  07/02/2007   COLONOSCOPY N/A 05/02/2021   Procedure: COLONOSCOPY;  Surgeon: Tol[place], 13/08/2020, MD;  Location: [facility] ENDOSCOPY;  Service: Gastroenterology;  Laterality: N/A;   MASTECTOMY PARTIAL / LUMPECTOMY Left    benign  TUBAL LIGATION  1976    (Not in a hospital admission)  Social History   Socioeconomic History   Marital status: Married    Spouse name: Krista Godsil   Number of children: Not on file   Years of education: Not on file   Highest education level: Not on file  Occupational History   Not on file  Tobacco Use   Smoking status: Every Day    Packs/day: 1.00    Years: 53.00    Total pack years: 53.00    Types: Cigarettes   Smokeless tobacco: Never  Vaping Use   Vaping Use:  Never used  Substance and Sexual Activity   Alcohol use: Yes    Comment: social   Drug use: Never   Sexual activity: Yes  Other Topics Concern   Not on file  Social History Narrative   Not on file   Social Determinants of Health   Financial Resource Strain: Not on file  Food Insecurity: Not on file  Transportation Needs: Not on file  Physical Activity: Not on file  Stress: Not on file  Social Connections: Not on file  Intimate Partner Violence: Not on file    Family History  Problem Relation Age of Onset   Cervical cancer Mother    Thyroid disease Mother    Prostate cancer Father    Cerebral aneurysm Sister       Intake/Output Summary (Last 24 hours) at 06/12/2022 2130 Last data filed at 06/12/2022 0551 Gross per 24 hour  Intake 63.15 ml  Output --  Net 63.15 ml    Vitals:   06/12/22 0405 06/12/22 0500 06/12/22 0600 06/12/22 0732  BP: 119/78 (!) 97/56 100/77 93/62  Pulse: (!) 104 88 89 71  Resp: (!) 30 20 18 16   Temp: 98.4 F (36.9 C)     TempSrc: Oral     SpO2: 100% 97% 98% 99%  Weight:      Height:        PHYSICAL EXAM General: Pleasant thin Caucasian female, appears older than stated age,  in no acute distress. HEENT:  Normocephalic and atraumatic. Neck:  No JVD.  Lungs: Slight conversational dyspnea on 4 L by nasal cannula, decreased breath sounds with trace bibasilar crackles, without appreciable wheezes  heart: HRRR . Normal S1 and S2 without gallops or murmurs.  Abdomen: Non-distended appearing.  Msk: Normal strength and tone for age. Extremities: Warm and well perfused. No clubbing, cyanosis.  No peripheral edema.  Neuro: Alert and oriented X 3. Psych:  Answers questions appropriately.   Labs: Basic Metabolic Panel: Recent Labs    06/12/22 0403  NA 140  K 2.9*  CL 107  CO2 24  GLUCOSE 160*  BUN 16  CREATININE 0.77  CALCIUM 8.5*  MG 1.9   Liver Function Tests: Recent Labs    06/12/22 0403  AST 38  ALT 21  ALKPHOS 72  BILITOT  0.9  PROT 6.5  ALBUMIN 3.5   No results for input(s): "LIPASE", "AMYLASE" in the last 72 hours. CBC: Recent Labs    06/12/22 0403  WBC 16.1*  NEUTROABS 11.1*  HGB 10.7*  HCT 33.0*  MCV 94.0  PLT 429*   Cardiac Enzymes: Recent Labs    06/12/22 0403 06/12/22 0558  TROPONINIHS 813* 745*   BNP: Recent Labs    06/12/22 0403  BNP 1,015.7*   D-Dimer: No results for input(s): "DDIMER" in the last 72 hours. Hemoglobin A1C: No results for input(s): "HGBA1C" in the last  72 hours. Fasting Lipid Panel: No results for input(s): "CHOL", "HDL", "LDLCALC", "TRIG", "CHOLHDL", "LDLDIRECT" in the last 72 hours. Thyroid Function Tests: No results for input(s): "TSH", "T4TOTAL", "T3FREE", "THYROIDAB" in the last 72 hours.  Invalid input(s): "FREET3" Anemia Panel: No results for input(s): "VITAMINB12", "FOLATE", "FERRITIN", "TIBC", "IRON", "RETICCTPCT" in the last 72 hours.   [facility]  Result Date: 06/12/2022 CLINICAL DATA:  Shortness of breath EXAM: PORTABLE CHEST 1 VIEW COMPARISON:  Chest CT 09/28/2021 FINDINGS: Diffuse interstitial opacity with Charyl DancerKerley lines. There is a background of hyperinflation and apical emphysema. Normal heart size and aortic contours. No effusion or pneumothorax. IMPRESSION: CHF superimposed on emphysema. Electronically Signed   By: Tiburcio PeaJonathan  Watts M.D.   On: 06/12/2022 04:31    ECHO no prior available for review  TELEMETRY reviewed by me (LT) 06/12/2022 : Initially sinus tachycardia with rate in the low 100s, currently sinus rhythm with rate 70s to 80s  EKG reviewed by me: Sinus tachycardia with nonspecific ST changes  Data reviewed by me (LT) 06/12/2022: Last PCP note, ED note, CBC BMP vitals telemetry troponins BNP chest x-ray EKG  Principal Problem:   Acute respiratory failure with hypoxia (HCC) Active Problems:   Hypothyroidism   COPD exacerbation (HCC)   Tobacco abuse   NSTEMI (non-ST elevated myocardial infarction) (HCC)    Acute CHF (congestive heart failure) (HCC)   Sepsis (HCC)   Hypokalemia   Normocytic anemia    ASSESSMENT AND PLAN:  Denise Allen is a 71yoF with a PMH of COPD, ongoing tobacco use, hypothyroidism who [facility] the early morning hours of 06/12/22 by EMS with respiratory distress at home.  Reports for shortness of breath started last night, borderline hypoxic to 89% on room air, requiring BiPAP in the emergency department.  Cardiology is consulted for her elevated troponin (813, 745).   # Acute hypoxic respiratory failure # ?  COPD exacerbation Presents with several days of increased shortness of breath, initially improving with inhaler use, acutely worsening the morning of 12/13.  In the emergency department she was in respiratory distress with tripoding, initially requiring BiPAP, weaned to 4 L by nasal cannula after DuoNeb's, Solu-Medrol, and IV Lasix 20 mg x 2. -Wean oxygen as tolerated -Started on empiric antibiotics per primary team  # Elevated BNP, query diastolic CHF No prior history of HF, BNP 1000 on admission with interstitial opacities on chest x-ray, crackles on exam.  Otherwise not grossly hypervolemic appearing. -S/p IV Lasix 20 mg x 2 with clinical improvement -Continue 20 mg IV Lasix x 2 more doses, then reassess -Defer additional medications at this time with relative low BP (90/60s) -Echocardiogram complete, further recommendations based on its results  # Elevated troponin ddx demand ischemia vs nstemi  Reported chest tightness primarily associated with increased work of breathing/while in respiratory distress, that gradually improved.  Primary cardiovascular risk factor is ongoing and significant history of tobacco use and hyperlipidemia (LDL 125 from 09/2021).  Troponins are elevated with a flat trend 813, 945 with repeats pending, EKG showed sinus tachycardia with nonspecific ST changes.  Suspect demand ischemia in the setting of hypoxia/respiratory distress  with possible underlying CAD. -S/p 325 mg aspirin, continue aspirin 81 mg daily -Continue heparin drip for now -Start atorvastatin 40 mg once daily -Echocardiogram complete, further recommendations based on his results, no ischemic workup planned at this time. -Risk factor modification with lipid panel, A1c  # Tobacco abuse 50-pack-year history, current 1 pack/day use.  Request nicotine patch.  Strongly encouraged complete cessation, she and her husband are motivated to quit.  This patient's plan of care was discussed and created with Dr. Juliann Pares and he is in agreement.  Signed: Rebeca Allegra , PA-C 06/12/2022, 9:06 AM [facility] Cardiology

## 2022-06-12 NOTE — H&P (Addendum)
History and Physical    Denise Allen [id number] DOB: 09/02/1950 DOA: 06/12/2022  Referring MD/NP/PA:   PCP: Baxter Hire, MD   Patient coming from:  The patient is coming from home.  At baseline, pt is independent for most of ADL.        Chief Complaint: SOB  HPI: Denise Allen is a 71 y.o. female with medical history significant of COPD not on O2 at home, tobacco abuse, hypothyroidism, who presents with SOB.  Patient states that she has shortness of breath for 2 days, which has been progressively worsening.  Patient has productive cough, with little mucus production.  Denies fever or chills.  Patient states that she had mild chest pain which is located lower chest, mild, pressure-like, nonradiating.  Her chest pain has resolved currently.  Denies nausea, vomiting, diarrhea or abdominal pain.  No symptoms of UTI.  No long distance traveling recently.  Denies tenderness in the calf areas.  Patient denies dark stool or rectal bleeding.  No recent fall or head injury.   Patient is normally not using oxygen at home.  Patient was found to have severe respiratory distress, oxygen desaturating to 89% on room air, cannot speak in full sentence, BiPAP is started in ED. Pt has received 125 mg of Solu-Medrol per report.  Data reviewed independently and ED Course: pt was found to have BNP 1015.7, troponin level 8013 --> 745, negative COVID PCR, INR 1.2, PTT 35, potassium 2.9, magnesium 1.9, GFR> 60, temperature normal, blood pressure 97/56, heart rate 104, 89, RR 30, oxygen saturation 98% on BiPAP, chest x-ray showed pulmonary edema.  Patient is admitted to PCU as inpatient. Consulted Dr. Clayborn Bigness of card.   EKG: I have personally reviewed.  Sinus rhythm, QTc 459, low voltage, artificially effective   Review of Systems:   General: no fevers, chills, no body weight gain, has fatigue HEENT: no blurry vision, hearing changes or sore throat Respiratory: has dyspnea, coughing, wheezing CV: had  chest pain, no palpitations GI: no nausea, vomiting, abdominal pain, diarrhea, constipation GU: no dysuria, burning on urination, increased urinary frequency, hematuria  Ext: no leg edema Neuro: no unilateral weakness, numbness, or tingling, no vision change or hearing loss Skin: no rash, no skin tear. MSK: No muscle spasm, no deformity, no limitation of range of movement in spin Heme: No easy bruising.  Travel history: No recent long distant travel.   Allergy:  Allergies  Allergen Reactions   Penicillins Other (See Comments)    Patient states "NOT ALLERGIC" causes severe yeast infection. Prefers NOT to use it.    Past Medical History:  Diagnosis Date   COPD (chronic obstructive pulmonary disease) ([place])    Dupuytren contracture    Hypothyroidism     Past Surgical History:  Procedure Laterality Date   BREAST EXCISIONAL BIOPSY Left 90s   benign   COLONOSCOPY  07/02/2007   COLONOSCOPY N/A 05/02/2021   Procedure: COLONOSCOPY;  Surgeon: Toledo, Benay Pike, MD;  Location: [facility] ENDOSCOPY;  Service: Gastroenterology;  Laterality: N/A;   MASTECTOMY PARTIAL / LUMPECTOMY Left    benign   TUBAL LIGATION  1976    Social History:  reports that she has been smoking cigarettes. She has a 53.00 pack-year smoking history. She has never used smokeless tobacco. She reports current alcohol use. She reports that she does not use drugs.  Family History:  Family History  Problem Relation Age of Onset   Cervical cancer Mother    Thyroid disease Mother  Prostate cancer Father    Cerebral aneurysm Sister      Prior to Admission medications   Medication Sig Start Date End Date Taking? Authorizing Provider  ALBUTEROL IN Inhale 90 mcg into the lungs every 6 (six) hours as needed (wheezing).    [provider]  budesonide-formoterol (SYMBICORT) 80-4.5 MCG/ACT inhaler Inhale 2 puffs into the lungs 2 (two) times daily.    [provider]  diclofenac Sodium (VOLTAREN) 1 % GEL  Apply 2 g topically in the morning, at noon, and at bedtime. To affected joint. 07/04/21   Montel Culver, MD  IBUPROFEN PO Take by mouth.    [provider]  levothyroxine (SYNTHROID) 88 MCG tablet Take 88 mcg by mouth daily before breakfast.    [provider]    Physical Exam: Vitals:   06/12/22 0500 06/12/22 0600 06/12/22 0732 06/12/22 1019  BP: (!) 97/56 100/77 93/62   Pulse: 88 89 71   Resp: 20 18 16    Temp:    99.1 F (37.3 C)  TempSrc:    Oral  SpO2: 97% 98% 99%   Weight:      Height:       General: in acute respiratory distress HEENT:       Eyes: PERRL, EOMI, no scleral icterus.       ENT: No discharge from the ears and nose, no pharynx injection, no tonsillar enlargement.        Neck: has positive JVD, no bruit, no mass felt. Heme: No neck lymph node enlargement. Cardiac: S1/S2, RRR, No murmurs, No gallops or rubs. Respiratory: Has decreased air movement, has mild wheezing bilaterally GI: Soft, nondistended, nontender, no rebound pain, no organomegaly, BS present. GU: No hematuria Ext: No pitting leg edema bilaterally. 1+DP/PT pulse bilaterally. Musculoskeletal: No joint deformities, No joint redness or warmth, no limitation of ROM in spin. Skin: No rashes.  Neuro: Alert, oriented X3, cranial nerves II-XII grossly intact, moves all extremities normally.   Psych: Patient is not psychotic, no suicidal or hemocidal ideation.  Labs on Admission: I have personally reviewed following labs and imaging studies  CBC: Recent Labs  Lab 06/12/22 0403  WBC 16.1*  NEUTROABS 11.1*  HGB 10.7*  HCT 33.0*  MCV 94.0  PLT [id number]*   Basic Metabolic Panel: Recent Labs  Lab 06/12/22 0403  NA 140  K 2.9*  CL 107  CO2 24  GLUCOSE 160*  BUN 16  CREATININE 0.77  CALCIUM 8.5*  MG 1.9   GFR: Estimated Creatinine Clearance: 51.8 mL/min (by C-G formula based on SCr of 0.77 mg/dL). Liver Function Tests: Recent Labs  Lab 06/12/22 0403  AST 38  ALT 21   ALKPHOS 72  BILITOT 0.9  PROT 6.5  ALBUMIN 3.5   No results for input(s): "LIPASE", "AMYLASE" in the last 168 hours. No results for input(s): "AMMONIA" in the last 168 hours. Coagulation Profile: Recent Labs  Lab 06/12/22 0558  INR 1.2   Cardiac Enzymes: No results for input(s): "CKTOTAL", "CKMB", "CKMBINDEX", "TROPONINI" in the last 168 hours. BNP (last 3 results) No results for input(s): "PROBNP" in the last 8760 hours. HbA1C: No results for input(s): "HGBA1C" in the last 72 hours. CBG: No results for input(s): "GLUCAP" in the last 168 hours. Lipid Profile: No results for input(s): "CHOL", "HDL", "LDLCALC", "TRIG", "CHOLHDL", "LDLDIRECT" in the last 72 hours. Thyroid Function Tests: No results for input(s): "TSH", "T4TOTAL", "FREET4", "T3FREE", "THYROIDAB" in the last 72 hours. Anemia Panel: Recent Labs  06/12/22 0403  FOLATE 6.2  FERRITIN 39  TIBC 297  IRON 22*  RETICCTPCT 2.2   Urine analysis: No results found for: "COLORURINE", "APPEARANCEUR", "LABSPEC", "PHURINE", "GLUCOSEU", "HGBUR", "BILIRUBINUR", "KETONESUR", "PROTEINUR", "UROBILINOGEN", "NITRITE", "LEUKOCYTESUR" Sepsis Labs: @LABRCNTIP (procalcitonin:4,lacticidven:4) ) Recent Results (from the past 240 hour(s))  Resp panel by RT-PCR (RSV, Flu A&B, Covid) Anterior Nasal Swab     Status: None   Collection Time: 06/12/22  4:03 AM   Specimen: Anterior Nasal Swab  Result Value Ref Range Status   SARS Coronavirus 2 by RT PCR NEGATIVE NEGATIVE Final    Comment: (NOTE) SARS-CoV-2 target nucleic acids are NOT DETECTED.  The SARS-CoV-2 RNA is generally detectable in upper respiratory specimens during the acute phase of infection. The lowest concentration of SARS-CoV-2 viral copies this assay can detect is 138 copies/mL. A negative result does not preclude SARS-Cov-2 infection and should not be used as the sole basis for treatment or other patient management decisions. A negative result may occur with   improper specimen collection/handling, submission of specimen other than nasopharyngeal swab, presence of viral mutation(s) within the areas targeted by this assay, and inadequate number of viral copies(<138 copies/mL). A negative result must be combined with clinical observations, patient history, and epidemiological information. The expected result is Negative.  Fact Sheet for Patients:  EntrepreneurPulse.com.au  Fact Sheet for Healthcare Providers:  IncredibleEmployment.be  This test is no t yet approved or cleared by the [place] FDA and  has been authorized for detection and/or diagnosis of SARS-CoV-2 by FDA under an Emergency Use Authorization (EUA). This EUA will remain  in effect (meaning this test can be used) for the duration of the COVID-19 declaration under Section 564(b)(1) of the Act, 21 U.S.C.section 360bbb-3(b)(1), unless the authorization is terminated  or revoked sooner.       Influenza A by PCR NEGATIVE NEGATIVE Final   Influenza B by PCR NEGATIVE NEGATIVE Final    Comment: (NOTE) The Xpert Xpress SARS-CoV-2/FLU/RSV plus assay is intended as an aid in the diagnosis of influenza from Nasopharyngeal swab specimens and should not be used as a sole basis for treatment. Nasal washings and aspirates are unacceptable for Xpert Xpress SARS-CoV-2/FLU/RSV testing.  Fact Sheet for Patients: EntrepreneurPulse.com.au  Fact Sheet for Healthcare Providers: IncredibleEmployment.be  This test is not yet approved or cleared by the [place] FDA and has been authorized for detection and/or diagnosis of SARS-CoV-2 by FDA under an Emergency Use Authorization (EUA). This EUA will remain in effect (meaning this test can be used) for the duration of the COVID-19 declaration under Section 564(b)(1) of the Act, 21 U.S.C. section 360bbb-3(b)(1), unless the authorization is terminated or revoked.      Resp Syncytial Virus by PCR NEGATIVE NEGATIVE Final    Comment: (NOTE) Fact Sheet for Patients: EntrepreneurPulse.com.au  Fact Sheet for Healthcare Providers: IncredibleEmployment.be  This test is not yet approved or cleared by the [place] FDA and has been authorized for detection and/or diagnosis of SARS-CoV-2 by FDA under an Emergency Use Authorization (EUA). This EUA will remain in effect (meaning this test can be used) for the duration of the COVID-19 declaration under Section 564(b)(1) of the Act, 21 U.S.C. section 360bbb-3(b)(1), unless the authorization is terminated or revoked.  Performed at [facility], [address], [place], [place] [address]      Radiological Exams on Admission: DG Chest [facility] 1 View  Result Date: 06/12/2022 CLINICAL DATA:  Shortness of breath EXAM: PORTABLE CHEST 1 VIEW COMPARISON:  Chest CT  09/28/2021 FINDINGS: Diffuse interstitial opacity with Kerley lines. There is a background of hyperinflation and apical emphysema. Normal heart size and aortic contours. No effusion or pneumothorax. IMPRESSION: CHF superimposed on emphysema. Electronically Signed   By: Tiburcio Pea M.D.   On: 06/12/2022 04:31      Assessment/Plan Principal Problem:   Acute respiratory failure with hypoxia (HCC) Active Problems:   Acute CHF (congestive heart failure) (HCC)   COPD exacerbation (HCC)   NSTEMI (non-ST elevated myocardial infarction) (HCC)   Hypothyroidism   Hypokalemia   Normocytic anemia   Tobacco abuse   Assessment and Plan:  Acute respiratory failure with hypoxia (HCC): Likely due to combination of COPD and acute CHF.  Patient has productive cough, decreased air movement on auscultation with mild wheezing, indicating COPD exacerbation.  Patient does not have leg edema, but she has elevated BNP 1015, pulm edema on chest x-ray, positive JVD on examination, indicating possible acute CHF.  No 2D echo on  record.  -Admitted to PCU as inpatient -Bronchodilators -IV Lasix for CHF -Will wean off BiPAP and start nasal cannula oxygen to maintain oxygen saturation above 93%  Possible acute CHF (congestive heart failure) (HCC): No 2D echocardiogram on record.  Not sure which type of CHF.  Will get 2D echo for further evaluation.  Consulted Dr. Juliann Pares of cardiology -Lasix 20 mg of IV lasix was were given --> will hold off more lasix due to soft Bp -2d echo -Daily weights -strict I/O's -Low salt diet -Fluid restriction -Obtain REDs Vest reading  COPD exacerbation (HCC): -Bronchodilators -Patient received 2 g of magnesium sulfate in ED -Solu-Medrol 40 mg IV bid -Z pak  -Mucinex for cough  -Incentive spirometry -sputum culture -Nasal cannula oxygen as needed to maintain O2 saturation 93% or greater when pt is off BiPAP  NSTEMI (non-ST elevated myocardial infarction) (HCC): trop  813 --> 745.  Her chest pain has resolved currently. - Trend Trop - prn Nitroglycerin, Morphine - Aspirin - Risk factor stratification: will check FLP and A1C  - 2d echo  Hypothyroidism -Synthroid  Hypokalemia: Potassium 2.9, magnesium 1.9 -Repleted potassium  Normocytic anemia: Hemoglobin 10.7 (13.3 on 10/03/2021) patient denies any active bleeding.  Denies dark stool or rectal bleeding. -Follow-up with CBC q8h -anemia panel  Tobacco abuse -Nicotine patch    DVT ppx: on IV Heparin      Code Status: DNR (I discussed with patient and explained the meaning of CODE STATUS. Patient is very sure that she wants to be DNR)  Family Communication: I have tried to call her husband several times without success  Disposition Plan:  Anticipate discharge back to previous environment  Consults called:   Consulted Dr. Juliann Pares of card.  Admission status and Level of care: Progressive:  as inpt       Dispo: The patient is from: Home              Anticipated d/c is to: Home              Anticipated d/c date  is: 2 days              Patient currently is not medically stable to d/c.    Severity of Illness:  The appropriate patient status for this patient is INPATIENT. Inpatient status is judged to be reasonable and necessary in order to provide the required intensity of service to ensure the patient's safety. The patient's presenting symptoms, physical exam findings, and initial radiographic and laboratory data in the  context of their chronic comorbidities is felt to place them at high risk for further clinical deterioration. Furthermore, it is not anticipated that the patient will be medically stable for discharge from the hospital within 2 midnights of admission.   * I certify that at the point of admission it is my clinical judgment that the patient will require inpatient hospital care spanning beyond 2 midnights from the point of admission due to high intensity of service, high risk for further deterioration and high frequency of surveillance required.*       Date of Service 06/12/2022    Ivor Costa [facility]   If 7PM-7AM, please contact night-coverage [email or web address] 06/12/2022, 12:46 PM

## 2022-06-12 NOTE — ED Provider Notes (Signed)
[facility] Provider Note    Event Date/Time   First MD Initiated Contact with Patient 06/12/22 [phone]     (approximate)   History   Shortness of Breath   HPI  Denise Allen is a 71 y.o. female  brought to the ED via EMS from home with a chief complaint of respiratory distress. Patient with a history of COPD not on home oxygen. Reports sob which began last night. EMS reports RA saturations 89%. Given 2 duonebs and 125mg  IV Solumedrol prior to arrival. Denies fever, chest pain, abdominal pain, nausea, vomiting or dizziness.       Past Medical History   Past Medical History:  Diagnosis Date   COPD (chronic obstructive pulmonary disease) (HCC)    Dupuytren contracture    Hypothyroidism      Active Problem List   Patient Active Problem List   Diagnosis Date Noted   Radial styloid tenosynovitis of left hand 09/10/2021   Trigger finger, right index finger 07/23/2021   Trigger finger, right ring finger 07/04/2021   Personal history of tobacco use, presenting hazards to health 08/27/2018   Dupuytren's disease of palm 04/28/2017   Bilateral hand pain 04/10/2016   Pain in right wrist 04/10/2016   COPD (chronic obstructive pulmonary disease) (HCC) 05/09/2014   Hypothyroidism 05/09/2014     Past Surgical History   Past Surgical History:  Procedure Laterality Date   BREAST EXCISIONAL BIOPSY Left 90s   benign   COLONOSCOPY  07/02/2007   COLONOSCOPY N/A 05/02/2021   Procedure: COLONOSCOPY;  Surgeon: Toledo, 13/08/2020, MD;  Location: [facility] ENDOSCOPY;  Service: Gastroenterology;  Laterality: N/A;   MASTECTOMY PARTIAL / LUMPECTOMY Left    benign   TUBAL LIGATION  1976     Home Medications   Prior to Admission medications   Medication Sig Start Date End Date Taking? Authorizing Provider  ALBUTEROL IN Inhale 90 mcg into the lungs every 6 (six) hours as needed (wheezing).    [provider]  budesonide-formoterol (SYMBICORT) 80-4.5 MCG/ACT  inhaler Inhale 2 puffs into the lungs 2 (two) times daily.    [provider]  diclofenac Sodium (VOLTAREN) 1 % GEL Apply 2 g topically in the morning, at noon, and at bedtime. To affected joint. 07/04/21   09/01/21, MD  IBUPROFEN PO Take by mouth.    [provider]  levothyroxine (SYNTHROID) 88 MCG tablet Take 88 mcg by mouth daily before breakfast.    [provider]     Allergies  Penicillins   Family History   Family History  Problem Relation Age of Onset   Cervical cancer Mother    Thyroid disease Mother    Prostate cancer Father    Cerebral aneurysm Sister      Physical Exam  Triage Vital Signs: ED Triage Vitals  Enc Vitals Group     BP      Pulse      Resp      Temp      Temp src      SpO2      Weight      Height      Head Circumference      Peak Flow      Pain Score      Pain Loc      Pain Edu?      Excl. in GC?     Updated Vital Signs: BP (!) 97/56   Pulse 88   Temp 98.4  F (36.9 C) (Oral)   Resp 20   Ht 5\' 3"  (1.6 m)   Wt 53.5 kg   SpO2 97%   BMI 20.90 kg/m    General: Awake, moderate distress.  CV:  Tachycardic. Good peripheral perfusion.  Resp:  Increased effort. Diminished with bibasilar rales. Tripod positioning. Abd:  Nontender. No distention.  Other:  No pedal edema.   ED Results / Procedures / Treatments  Labs (all labs ordered are listed, but only abnormal results are displayed) Labs Reviewed  CBC WITH DIFFERENTIAL/PLATELET - Abnormal; Notable for the following components:      Result Value   WBC 16.1 (*)    RBC 3.51 (*)    Hemoglobin 10.7 (*)    HCT 33.0 (*)    Platelets 429 (*)    Neutro Abs 11.1 (*)    Monocytes Absolute 1.2 (*)    Abs Immature Granulocytes 0.09 (*)    All other components within normal limits  COMPREHENSIVE METABOLIC PANEL - Abnormal; Notable for the following components:   Potassium 2.9 (*)    Glucose, Bld 160 (*)    Calcium 8.5 (*)    All other components within  normal limits  BRAIN NATRIURETIC PEPTIDE - Abnormal; Notable for the following components:   B Natriuretic Peptide 1,015.7 (*)    All other components within normal limits  TROPONIN I (HIGH SENSITIVITY) - Abnormal; Notable for the following components:   Troponin I (High Sensitivity) 813 (*)    All other components within normal limits  RESP PANEL BY RT-PCR (RSV, FLU A&B, COVID)  RVPGX2  MAGNESIUM  TROPONIN I (HIGH SENSITIVITY)     EKG  ED ECG REPORT I, Valbona Slabach J, the attending physician, personally viewed and interpreted this ECG.   Date: 06/12/2022  EKG Time: 0407  Rate: 102  Rhythm: sinus tachycardia  Axis: Normal  Intervals:none  ST&T Change: Nonspecific    RADIOLOGY I have independently visualized and interpreted patient's xray as well as noted the radiology interpretation:  Cxr: CHF  Official radiology report(s): DG Chest Port 1 View  Result Date: 06/12/2022 CLINICAL DATA:  Shortness of breath EXAM: PORTABLE CHEST 1 VIEW COMPARISON:  Chest CT 09/28/2021 FINDINGS: Diffuse interstitial opacity with Kerley lines. There is a background of hyperinflation and apical emphysema. Normal heart size and aortic contours. No effusion or pneumothorax. IMPRESSION: CHF superimposed on emphysema. Electronically Signed   By: 09/30/2021 M.D.   On: 06/12/2022 04:31     PROCEDURES:  Critical Care performed: Yes, see critical care procedure note(s)  CRITICAL CARE Performed by: 06/14/2022   Total critical care time: 45 minutes  Critical care time was exclusive of separately billable procedures and treating other patients.  Critical care was necessary to treat or prevent imminent or life-threatening deterioration.  Critical care was time spent personally by me on the following activities: development of treatment plan with patient and/or surrogate as well as nursing, discussions with consultants, evaluation of patient's response to treatment, examination of patient,  obtaining history from patient or surrogate, ordering and performing treatments and interventions, ordering and review of laboratory studies, ordering and review of radiographic studies, pulse oximetry and re-evaluation of patient's condition.   Irean Hong1-3 Lead EKG Interpretation  Performed by: Marland Kitchen, MD Authorized by: Irean Hong, MD     Interpretation: abnormal     ECG rate:  102   ECG rate assessment: tachycardic     Rhythm: sinus tachycardia     Ectopy: none  Conduction: normal   Comments:     Patient placed on cardiac monitor to evaluate for arrthymias    MEDICATIONS ORDERED IN ED: Medications  heparin injection 3,200 Units (has no administration in time range)  heparin ADULT infusion 100 units/mL (25000 units/282mL) (has no administration in time range)  magnesium sulfate IVPB 2 g 50 mL (2 g Intravenous New Bag/Given 06/12/22 0426)  LORazepam (ATIVAN) injection 0.5 mg (0.5 mg Intravenous Given 06/12/22 0423)  ondansetron (ZOFRAN) injection 4 mg (4 mg Intravenous Given 06/12/22 0423)  furosemide (LASIX) injection 20 mg (20 mg Intravenous Given 06/12/22 0518)     IMPRESSION / MDM / ASSESSMENT AND PLAN / ED COURSE  I reviewed the triage vital signs and the nursing notes.                             71 year old female presenting in respiratory distress with hypoxia. Differential includes, but is not limited to, viral syndrome, bronchitis including COPD exacerbation, pneumonia, reactive airway disease including asthma, CHF including exacerbation with or without pulmonary/interstitial edema, pneumothorax, ACS, thoracic trauma, and pulmonary embolism. I have personally reviewed patient's records and note a PCP office visit on 04/11/2022 for COPD.  Patient's presentation is most consistent with acute presentation with potential threat to life or bodily function.  The patient is on the cardiac monitor to evaluate for evidence of arrhythmia and/or significant heart rate  changes.  Will obtain cardiac and respiratory panels, cxr. Place on BiPAP. Administer 2g IV Magnesium. Anticipate hospitalization.  Clinical Course as of 06/12/22 0543  Wed Jun 12, 2022  0508 Patient tolerating BiPAP better after IV Ativan. Denies history of CHF. Will administer low dose IV Lasix. Awaiting rest of labwork and respiratory panel. [JS]  0543 Verbal from lab troponin 800s. Will initiate heparin bolus with gtt. Consult hospitalist service for evaluation and admission. [JS]    Clinical Course User Index [JS] Irean Hong, MD     FINAL CLINICAL IMPRESSION(S) / ED DIAGNOSES   Final diagnoses:  COPD exacerbation (HCC)  Hypoxia  Respiratory distress  Acute congestive heart failure, unspecified heart failure type [facility])     Rx / DC Orders   ED Discharge Orders     None        Note:  This document was prepared using Dragon voice recognition software and may include unintentional dictation errors.   Irean Hong, MD 06/12/22 [phone]

## 2022-06-13 ENCOUNTER — Other Ambulatory Visit: Payer: Self-pay

## 2022-06-13 DIAGNOSIS — J9601 Acute respiratory failure with hypoxia: Secondary | ICD-10-CM | POA: Diagnosis not present

## 2022-06-13 LAB — CBC
HCT: 29.9 % — ABNORMAL LOW (ref 36.0–46.0)
HCT: 29.2 % — ABNORMAL LOW (ref 36.0–46.0)
Hemoglobin: 9.5 g/dL — ABNORMAL LOW (ref 12.0–15.0)
Hemoglobin: 9.5 g/dL — ABNORMAL LOW (ref 12.0–15.0)
MCH: 30.1 pg (ref 26.0–34.0)
MCH: 30.4 pg (ref 26.0–34.0)
MCHC: 31.8 g/dL (ref 30.0–36.0)
MCHC: 32.5 g/dL (ref 30.0–36.0)
MCV: 94.6 fL (ref 80.0–100.0)
MCV: 93.3 fL (ref 80.0–100.0)
Platelets: 403 10*3/uL — ABNORMAL HIGH (ref 150–400)
Platelets: 403 10*3/uL — ABNORMAL HIGH (ref 150–400)
RBC: 3.16 MIL/uL — ABNORMAL LOW (ref 3.87–5.11)
RBC: 3.13 MIL/uL — ABNORMAL LOW (ref 3.87–5.11)
RDW: 13.2 % (ref 11.5–15.5)
RDW: 13.4 % (ref 11.5–15.5)
WBC: 17 10*3/uL — ABNORMAL HIGH (ref 4.0–10.5)
WBC: 19.5 10*3/uL — ABNORMAL HIGH (ref 4.0–10.5)
nRBC: 0 % (ref 0.0–0.2)
nRBC: 0 % (ref 0.0–0.2)

## 2022-06-13 LAB — BASIC METABOLIC PANEL
Anion gap: 7 (ref 5–15)
BUN: 31 mg/dL — ABNORMAL HIGH (ref 8–23)
CO2: 22 mmol/L (ref 22–32)
Calcium: 8.5 mg/dL — ABNORMAL LOW (ref 8.9–10.3)
Chloride: 106 mmol/L (ref 98–111)
Creatinine, Ser: 1.2 mg/dL — ABNORMAL HIGH (ref 0.44–1.00)
GFR, Estimated: 48 mL/min — ABNORMAL LOW (ref >60)
Glucose, Bld: 169 mg/dL — ABNORMAL HIGH (ref 70–99)
Potassium: 4.7 mmol/L (ref 3.5–5.1)
Sodium: 135 mmol/L (ref 135–145)

## 2022-06-13 LAB — LIPID PANEL
Cholesterol: 170 mg/dL (ref 0–200)
HDL: 43 mg/dL (ref >40)
LDL Cholesterol: 112 mg/dL — ABNORMAL HIGH (ref 0–99)
Total CHOL/HDL Ratio: 4 RATIO
Triglycerides: 76 mg/dL (ref <150)
VLDL: 15 mg/dL (ref 0–40)

## 2022-06-13 LAB — HEPARIN LEVEL (UNFRACTIONATED)
Heparin Unfractionated: 0.17 IU/mL — ABNORMAL LOW (ref 0.30–0.70)
Heparin Unfractionated: 0.41 IU/mL (ref 0.30–0.70)

## 2022-06-13 LAB — ECHOCARDIOGRAM COMPLETE
AR max vel: 1.58 cm2
AV Area VTI: 1.61 cm2
AV Area mean vel: 1.6 cm2
AV Mean grad: 3 mmHg
AV Peak grad: 6.5 mmHg
Ao pk vel: 1.27 m/s
Area-P 1/2: 3.95 cm2
Calc EF: 37 %
Height: 63 in
MV M vel: 4.49 m/s
MV Peak grad: 80.6 mmHg
S' Lateral: 3.9 cm
Single Plane A2C EF: 40.5 %
Single Plane A4C EF: 36.3 %
Weight: 1888 oz

## 2022-06-13 LAB — MAGNESIUM: Magnesium: 2.1 mg/dL (ref 1.7–2.4)

## 2022-06-13 LAB — LACTIC ACID, PLASMA: Lactic Acid, Venous: 2.1 mmol/L (ref 0.5–1.9)

## 2022-06-13 MED ORDER — ENOXAPARIN SODIUM 40 MG/0.4ML IJ SOSY
40.0000 mg | PREFILLED_SYRINGE | INTRAMUSCULAR | Status: DC
  Administered 2022-06-13 – 2022-06-14 (×2): 40 mg via SUBCUTANEOUS
  Filled 2022-06-13 (×2): qty 0.4

## 2022-06-13 MED ORDER — ARFORMOTEROL TARTRATE 15 MCG/2ML IN NEBU
15.0000 ug | INHALATION_SOLUTION | Freq: Two times a day (BID) | RESPIRATORY_TRACT | Status: DC
  Administered 2022-06-13 – 2022-06-15 (×5): 15 ug via RESPIRATORY_TRACT
  Filled 2022-06-13 (×6): qty 2

## 2022-06-13 MED ORDER — HEPARIN BOLUS VIA INFUSION
1600.0000 [IU] | Freq: Once | INTRAVENOUS | Status: AC
  Administered 2022-06-13: 1600 [IU] via INTRAVENOUS
  Filled 2022-06-13: qty 1600

## 2022-06-13 MED ORDER — BUDESONIDE 0.25 MG/2ML IN SUSP
0.2500 mg | Freq: Two times a day (BID) | RESPIRATORY_TRACT | Status: DC
  Administered 2022-06-13 – 2022-06-15 (×5): 0.25 mg via RESPIRATORY_TRACT
  Filled 2022-06-13 (×5): qty 2

## 2022-06-13 MED ORDER — IPRATROPIUM-ALBUTEROL 0.5-2.5 (3) MG/3ML IN SOLN
3.0000 mL | Freq: Four times a day (QID) | RESPIRATORY_TRACT | Status: DC
  Administered 2022-06-13 – 2022-06-15 (×7): 3 mL via RESPIRATORY_TRACT
  Filled 2022-06-13 (×7): qty 3

## 2022-06-13 NOTE — Care Management Important Message (Signed)
Important Message  Patient Details  Name: Denise Allen MRN: [id number] Date of Birth: 07-26-1950   Medicare Important Message Given:  N/A - LOS <3 / Initial given by admissions     [name] 06/13/2022, 5:06 PM

## 2022-06-13 NOTE — Progress Notes (Signed)
ANTICOAGULATION CONSULT NOTE  Pharmacy Consult for heparin infusion Indication: ACS/STEMI  Allergies  Allergen Reactions   Penicillins Other (See Comments)    Patient states "NOT ALLERGIC" causes severe yeast infection. Prefers NOT to use it.    Patient Measurements: Height: 5\' 3"  (160 cm) Weight: 53.5 kg (118 lb) IBW/kg (Calculated) : 52.4 Heparin Dosing Weight: 53.5 kg  Vital Signs: Temp: 98.6 F (37 C) (12/13 2300) BP: 84/60 (12/13 2300) Pulse Rate: 70 (12/13 2300)  Labs: Recent Labs    06/12/22 0403 06/12/22 0558 06/12/22 0950 06/12/22 1300 06/12/22 1811 06/12/22 2350  HGB 10.7*  --   --  10.4* 9.9*  --   HCT 33.0*  --   --  32.9* 31.1*  --   PLT 429*  --   --  431* 397  --   APTT  --  35  --   --   --   --   LABPROT  --  14.7  --   --   --   --   INR  --  1.2  --   --   --   --   HEPARINUNFRC  --   --   --  <0.10*  --  0.17*  CREATININE 0.77  --   --   --   --   --   TROPONINIHS 813* 745* 611*  --   --   --      Estimated Creatinine Clearance: 51.8 mL/min (by C-G formula based on SCr of 0.77 mg/dL).   Medical History: Past Medical History:  Diagnosis Date   COPD (chronic obstructive pulmonary disease) (HCC)    Dupuytren contracture    Hypothyroidism     Assessment: Pt is a 71 yo female presenting to ED c/o SOB, found with elevated Troponin I lvl.   Goal of Therapy:  Heparin level 0.3-0.7 units/ml Monitor platelets by anticoagulation protocol: Yes  12/13 @ 1300 HL <0.10, Subtherapeutic 12/13 2350 HL 0.17, subtherapeutic   Plan:  Bolus 1600 units x 1 Increase heparin infusion to 1000 units/hr Will check HL in 8 hr after rate change CBC daily while on heparin  1/14, PharmD, [facility] 06/13/2022 12:47 AM

## 2022-06-13 NOTE — ED Notes (Signed)
Assumed care from Emily, RN. Pt resting comfortably in bed at this time. Pt denies any current needs or questions. Call light with in reach.   

## 2022-06-13 NOTE — Progress Notes (Signed)
[facility] CLINIC CARDIOLOGY CONSULT NOTE       Patient ID: Denise Allen MRN: [id number] DOB/AGE: Oct 01, 1950 71 y.o.  Admit date: 06/12/2022 Referring Physician Dr. Clyde Lundborg Primary Physician Dr, [name] Primary Cardiologist none Reason for Consultation elevated troponin  HPI: Denise Allen is a 71yoF with a PMH of COPD, ongoing tobacco use, hypothyroidism who presented to [facility] ED the early morning hours of 06/12/22 by EMS with respiratory distress at home.  Reports for shortness of breath started last night, borderline hypoxic to 89% on room air, requiring BiPAP in the emergency department. She is being treated for a COPD exacerbation. Cardiology is consulted for her elevated troponin (813, 745, 611).   Interval History:  -had another episode of shortness of breath requiring bipap yesterday afternoon, back to 4L [place] -remains relatively hypotensive (~85-90/60s)  -troponin downtrend, echo pending -renal function bumped with diuresis  Review of systems complete and found to be negative unless listed above     Past Medical History:  Diagnosis Date   COPD (chronic obstructive pulmonary disease) (HCC)    Dupuytren contracture    Hypothyroidism     Past Surgical History:  Procedure Laterality Date   BREAST EXCISIONAL BIOPSY Left 90s   benign   COLONOSCOPY  07/02/2007   COLONOSCOPY N/A 05/02/2021   Procedure: COLONOSCOPY;  Surgeon: Toledo, Boykin Nearing, MD;  Location: [facility] ENDOSCOPY;  Service: Gastroenterology;  Laterality: N/A;   MASTECTOMY PARTIAL / LUMPECTOMY Left    benign   TUBAL LIGATION  1976    (Not in a hospital admission)  Social History   Socioeconomic History   Marital status: Married    Spouse name: Reuel Boom [place]   Number of children: Not on file   Years of education: Not on file   Highest education level: Not on file  Occupational History   Not on file  Tobacco Use   Smoking status: Every Day    Packs/day: 1.00    Years: 53.00    Total pack years: 53.00     Types: Cigarettes   Smokeless tobacco: Never  Vaping Use   Vaping Use: Never used  Substance and Sexual Activity   Alcohol use: Yes    Comment: social   Drug use: Never   Sexual activity: Yes  Other Topics Concern   Not on file  Social History Narrative   Not on file   Social Determinants of Health   Financial Resource Strain: Not on file  Food Insecurity: Not on file  Transportation Needs: Not on file  Physical Activity: Not on file  Stress: Not on file  Social Connections: Not on file  Intimate Partner Violence: Not on file    Family History  Problem Relation Age of Onset   Cervical cancer Mother    Thyroid disease Mother    Prostate cancer Father    Cerebral aneurysm Sister       Intake/Output Summary (Last 24 hours) at 06/13/2022 0812 Last data filed at 06/12/2022 1621 Gross per 24 hour  Intake --  Output 550 ml  Net -550 ml     Vitals:   06/13/22 0200 06/13/22 0400 06/13/22 0605 06/13/22 0645  BP: (!) 87/63  91/64   Pulse: 80  84   Resp: 17  20   Temp:  98.3 F (36.8 C)  98.2 F (36.8 C)  TempSrc:      SpO2: 95%  95%   Weight:      Height:  PHYSICAL EXAM General: Pleasant thin Caucasian female, appears older than stated age,  in no acute distress. HEENT:  Normocephalic and atraumatic. Neck:  + JVD.  Lungs: Slight conversational dyspnea on 4 L by nasal cannula, decreased breath sounds with trace bibasilar crackles, without appreciable wheezes  heart: HRRR . Normal S1 and S2 without gallops or murmurs.  Abdomen: Non-distended appearing.  Msk: Normal strength and tone for age. Extremities: Warm and well perfused. No clubbing, cyanosis.  No peripheral edema.  Neuro: Alert and oriented X 3. Psych:  Answers questions appropriately.   Labs: Basic Metabolic Panel: Recent Labs    06/12/22 0403 06/13/22 0451  NA 140 135  K 2.9* 4.7  CL 107 106  CO2 24 22  GLUCOSE 160* 169*  BUN 16 31*  CREATININE 0.77 1.20*  CALCIUM 8.5* 8.5*  MG 1.9  2.1    Liver Function Tests: Recent Labs    06/12/22 0403  AST 38  ALT 21  ALKPHOS 72  BILITOT 0.9  PROT 6.5  ALBUMIN 3.5    No results for input(s): "LIPASE", "AMYLASE" in the last 72 hours. CBC: Recent Labs    06/12/22 0403 06/12/22 1300 06/12/22 1811 06/13/22 0451  WBC 16.1*   < > 8.1 17.0*  NEUTROABS 11.1*  --   --   --   HGB 10.7*   < > 9.9* 9.5*  HCT 33.0*   < > 31.1* 29.9*  MCV 94.0   < > 92.8 94.6  PLT 429*   < > 397 403*   < > = values in this interval not displayed.    Cardiac Enzymes: Recent Labs    06/12/22 0403 06/12/22 0558 06/12/22 0950  TROPONINIHS 813* 745* 611*    BNP: Recent Labs    06/12/22 0403  BNP 1,015.7*    D-Dimer: No results for input(s): "DDIMER" in the last 72 hours. Hemoglobin A1C: Recent Labs    06/12/22 0453  HGBA1C 5.4   Fasting Lipid Panel: Recent Labs    06/13/22 0451  CHOL 170  HDL 43  LDLCALC 112*  TRIG 76  CHOLHDL 4.0   Thyroid Function Tests: No results for input(s): "TSH", "T4TOTAL", "T3FREE", "THYROIDAB" in the last 72 hours.  Invalid input(s): "FREET3" Anemia Panel: Recent Labs    06/12/22 0403 06/12/22 1012  VITAMINB12  --  142*  FOLATE 6.2  --   FERRITIN 39  --   TIBC 297  --   IRON 22*  --   RETICCTPCT 2.2  --      Radiology: [facility] Chest Port 1 View  Result Date: 06/12/2022 CLINICAL DATA:  Shortness of breath EXAM: PORTABLE CHEST 1 VIEW COMPARISON:  Chest CT 09/28/2021 FINDINGS: Diffuse interstitial opacity with Kerley lines. There is a background of hyperinflation and apical emphysema. Normal heart size and aortic contours. No effusion or pneumothorax. IMPRESSION: CHF superimposed on emphysema. Electronically Signed   By: Tiburcio Pea M.D.   On: 06/12/2022 04:31    ECHO no prior available for review  TELEMETRY reviewed by me (LT) 06/13/2022 : Initially sinus tachycardia with rate in the low 100s, currently sinus rhythm with rate 70s to 80s  EKG reviewed by me: Sinus tachycardia  with nonspecific ST changes  Data reviewed by me (LT) 06/13/2022: admission H&P CBC BMP vitals telemetry troponins  EKGs  Principal Problem:   Acute respiratory failure with hypoxia (HCC) Active Problems:   Hypothyroidism   COPD exacerbation (HCC)   Tobacco abuse   NSTEMI (non-ST elevated myocardial infarction) (  HCC)   Acute CHF (congestive heart failure) (HCC)   Hypokalemia   Normocytic anemia    ASSESSMENT AND PLAN:  Denise Allen is a 71yoF with a PMH of COPD, ongoing tobacco use, hypothyroidism who presented to [facility] ED the early morning hours of 06/12/22 by EMS with respiratory distress at home.  Reports for shortness of breath started last night, borderline hypoxic to 89% on room air, requiring BiPAP in the emergency department. She is being treated for a COPD exacerbation. Cardiology is consulted for her elevated troponin (813, 745, 611).   # Acute hypoxic respiratory failure # COPD exacerbation Presents with several days of increased shortness of breath, initially improving with inhaler use, acutely worsening the morning of 12/13.  In the emergency department she was in respiratory distress with tripoding, initially requiring BiPAP, weaned to 4 L by nasal cannula after DuoNeb's, Solu-Medrol, and IV Lasix 20 mg x 2. -Wean oxygen as tolerated -Started on empiric antibiotics per primary team  # Elevated BNP, query diastolic CHF No prior history of HF, BNP 1000 on admission with interstitial opacities on chest x-ray, crackles on exam.  Otherwise not grossly hypervolemic appearing. -S/p IV Lasix 20 mg x 2 with clinical improvement -Defer additional medications at this time with relative low BP (90/60s) -Echocardiogram complete, further recommendations based on its results  # demand ischemia  Reported chest tightness primarily associated with increased work of breathing/while in respiratory distress, that gradually improved.  Primary cardiovascular risk factor is ongoing and  significant history of tobacco use and hyperlipidemia (LDL 125 from 09/2021).  Troponins are elevated  and downtrending 813, 745, 611. EKG showed sinus tachycardia with nonspecific ST changes with repeat stable.  Suspect demand ischemia in the setting of hypoxia/respiratory distress with possible underlying CAD. -S/p 325 mg aspirin, continue aspirin 81 mg daily -stop heparin drip  -continue atorvastatin 40 mg once daily -Echocardiogram complete, further recommendations based on his results, no ischemic workup planned during this admission. -Risk factor modification with LDL 112, A1c 5.4%  # AKI Cr 0.77 -- 1.2, GFR 48 after lasix.  -hold further diuresis today  # Tobacco abuse 50-pack-year history, current 1 pack/day use.  continue nicotine patch.  Strongly encouraged complete cessation, she and her husband are motivated to quit.  Cardiology will sign off. Please haiku with questions or re-engage if needed.  This patient's plan of care was discussed and created with Dr. Juliann Pares and he is in agreement.  Signed: Rebeca Allegra , PA-C 06/13/2022, 8:12 AM [facility] Cardiology

## 2022-06-13 NOTE — TOC Initial Note (Signed)
Transition of Care [facility]) - Initial/Assessment Note    Patient Details  Name: Denise Allen MRN: [id number] Date of Birth: 1950-07-27  Transition of Care [facility]) CM/SW Contact:    Shelbie Hutching, RN Phone Number: 06/13/2022, 11:23 AM  Clinical Narrative:                 Patient admitted to the hospital with acute respiratory failure currently requiring acute oxygen at 4L [place].  RNCM met with patient at the bedside, introduced self and explained role in dC planning.  Patient is from home with her husband, independent, drives.  Current with Dr. Edwina Barth in [facility].  Cardiology will be getting her set up with OP Cardiology at [place] and she also says she will be getting and OP Pulmonologist.  Husband will provide her transportation home.  Patient hopes to be able to wean off oxygen before discharge.   Expected Discharge Plan: Home/Self Care Barriers to Discharge: Continued Medical Work up   Patient Goals and CMS Choice Patient states their goals for this hospitalization and ongoing recovery are:: hopes to not have to go home on oxygen      Expected Discharge Plan and Services Expected Discharge Plan: Home/Self Care   Discharge Planning Services: CM Consult   Living arrangements for the past 2 months: Single Family Home                           HH Arranged: NA [place] Agency: NA        Prior Living Arrangements/Services Living arrangements for the past 2 months: Single Family Home Lives with:: Spouse Patient language and need for interpreter reviewed:: Yes Do you feel safe going back to the place where you live?: Yes      Need for Family Participation in Patient Care: Yes (Comment) Care giver support system in place?: Yes (comment)   Criminal Activity/Legal Involvement Pertinent to Current Situation/Hospitalization: No - Comment as needed  Activities of Daily Living      Permission Sought/Granted Permission sought to share information with : Family  Supports Permission granted to share information with : Yes, Verbal Permission Granted  Share Information with NAME: Bernardette Waldron     Permission granted to share info w Relationship: spouse  Permission granted to share info w Contact Information: [phone]  Emotional Assessment Appearance:: Appears stated age Attitude/Demeanor/Rapport: Engaged Affect (typically observed): Accepting Orientation: : Oriented to Self, Oriented to Place, Oriented to  Time, Oriented to Situation Alcohol / Substance Use: Tobacco Use Psych Involvement: No (comment)  Admission diagnosis:  COPD exacerbation ([place]) [J44.1] Patient Active Problem List   Diagnosis Date Noted   COPD exacerbation ([place]) 06/12/2022   Tobacco abuse 06/12/2022   NSTEMI (non-ST elevated myocardial infarction) ([place]) 06/12/2022   Acute CHF (congestive heart failure) ([place]) 06/12/2022   Acute respiratory failure with hypoxia ([place]) 06/12/2022   Hypokalemia 06/12/2022   Normocytic anemia 06/12/2022   Radial styloid tenosynovitis of left hand 09/10/2021   Trigger finger, right index finger 07/23/2021   Trigger finger, right ring finger 07/04/2021   Personal history of tobacco use, presenting hazards to health 08/27/2018   Dupuytren's disease of palm 04/28/2017   Bilateral hand pain 04/10/2016   Pain in right wrist 04/10/2016   COPD (chronic obstructive pulmonary disease) ([place]) 05/09/2014   Hypothyroidism 05/09/2014   PCP:  Baxter Hire, MD Pharmacy:   [id number] [place] [place]  [place] [address] Phone: [phone] Fax: [phone]     Social Determinants of Health (SDOH) Interventions    Readmission Risk Interventions     No data to display

## 2022-06-13 NOTE — Progress Notes (Signed)
PROGRESS NOTE    Denise Allen  RFF:[id number] DOB: 07-08-1950 DOA: 06/12/2022 PCP: Gracelyn Nurse, MD    Brief Narrative:   71 y.o. female with medical history significant of COPD not on O2 at home, tobacco abuse, hypothyroidism, who presents with SOB.   Patient states that she has shortness of breath for 2 days, which has been progressively worsening.  Patient has productive cough, with little mucus production.  Denies fever or chills.  Patient states that she had mild chest pain which is located lower chest, mild, pressure-like, nonradiating.  Her chest pain has resolved currently.   Patient has been weaned off NIPPV.  Currently on 4 L nasal cannula.  Symptomatically improved.  Discussed with cardiology.  Elevated troponin felt to be due to supply demand ischemia.  Not consistent with NSTEMI.  No further cardiac recommendations at this time.  Treatment revolving around decompensated COPD.   Assessment & Plan:   Principal Problem:   Acute respiratory failure with hypoxia (HCC) Active Problems:   Acute CHF (congestive heart failure) (HCC)   COPD exacerbation (HCC)   NSTEMI (non-ST elevated myocardial infarction) (HCC)   Hypothyroidism   Hypokalemia   Normocytic anemia   Tobacco abuse  Acute respiratory failure with hypoxia Acute decompensated COPD Respiratory failure likely due to COPD.  Possible mild congestive heart failure.  Some pulmonary edema on chest x-ray. Plan: IV steroids Bronchodilators Oxygen as necessary, wean as tolerated As needed mucolytic's  Elevated BNP No prior history of heart failure.  BNP 8000 on admission.  Some interstitial opacities on chest x-ray.  Crackles on exam.  Otherwise overall euvolemic.  Status post IV Lasix 20 mg x 2 doses. Plan: Follow-up 2D echocardiogram Defer additional Lasix at this time  Elevated troponin Likely supply demand ischemia.  Not consistent with ACS.  Chest tightness likely due to decompensated COPD.  Primary  cardiovascular risk factor is ongoing tobacco use and hyperlipidemia. Plan: Stop IV heparin Aspirin 81 mg daily Atorvastatin 2D echocardiogram  Acute kidney injury Creatinine 0.77-1.2 No further Lasix at this time  Tobacco abuse Current 1 pack a day smoker.  Continue nicotine patch.  Advised smoking cessation  Hypothyroidism Continue home Synthroid  Hypokalemia Monitor and replace as necessary  Chronic normocytic anemia Hemoglobin 10.7.  No active bleeding. Follow-up anemia panel   DVT prophylaxis: SQ Lovenox Code Status: DNR Family Communication: None today Disposition Plan: Status is: Inpatient Remains inpatient appropriate because: Decompensated COPD.  Hypoxic respiratory failure   Level of care: Telemetry Medical  Consultants:  Cardiology (signed off)  Procedures:  None  Antimicrobials: Azithromycin   Subjective: Seen and examined.  Reports improvement in respiratory status since admission.  No pain complaints.  Objective: Vitals:   06/13/22 0833 06/13/22 1142 06/13/22 1200 06/13/22 1315  BP: (!) 89/64 (!) 87/56 (!) 92/59   Pulse: 81 85 87   Resp: 20 16 20    Temp: 98.2 F (36.8 C) 98.2 F (36.8 C)    TempSrc:      SpO2: 96% 98%  96%  Weight:      Height:        Intake/Output Summary (Last 24 hours) at 06/13/2022 1328 Last data filed at 06/13/2022 1136 Gross per 24 hour  Intake --  Output 750 ml  Net -750 ml   Filed Weights   06/12/22 0404  Weight: 53.5 kg    Examination:  General exam: Appears calm and comfortable  Respiratory system: Diminished air entry bilaterally.  Overall clear.  Bibasilar crackles.  Normal work of breathing.  4 L Cardiovascular system: S1-S2, RRR, no murmurs, no pedal edema Gastrointestinal system: Soft, NT/ND, normal bowel sounds Central nervous system: Alert and oriented. No focal neurological deficits. Extremities: Symmetric 5 x 5 power. Skin: No rashes, lesions or ulcers Psychiatry: Judgement and insight  appear normal. Mood & affect appropriate.     Data Reviewed: I have personally reviewed following labs and imaging studies  CBC: Recent Labs  Lab 06/12/22 0403 06/12/22 1300 06/12/22 1811 06/13/22 0451 06/13/22 0847  WBC 16.1* 8.6 8.1 17.0* 19.5*  NEUTROABS 11.1*  --   --   --   --   HGB 10.7* 10.4* 9.9* 9.5* 9.5*  HCT 33.0* 32.9* 31.1* 29.9* 29.2*  MCV 94.0 93.5 92.8 94.6 93.3  PLT 429* 431* 397 403* 403*   Basic Metabolic Panel: Recent Labs  Lab 06/12/22 0403 06/13/22 0451  NA 140 135  K 2.9* 4.7  CL 107 106  CO2 24 22  GLUCOSE 160* 169*  BUN 16 31*  CREATININE 0.77 1.20*  CALCIUM 8.5* 8.5*  MG 1.9 2.1   GFR: Estimated Creatinine Clearance: 34.5 mL/min (A) (by C-G formula based on SCr of 1.2 mg/dL (H)). Liver Function Tests: Recent Labs  Lab 06/12/22 0403  AST 38  ALT 21  ALKPHOS 72  BILITOT 0.9  PROT 6.5  ALBUMIN 3.5   No results for input(s): "LIPASE", "AMYLASE" in the last 168 hours. No results for input(s): "AMMONIA" in the last 168 hours. Coagulation Profile: Recent Labs  Lab 06/12/22 0558  INR 1.2   Cardiac Enzymes: No results for input(s): "CKTOTAL", "CKMB", "CKMBINDEX", "TROPONINI" in the last 168 hours. BNP (last 3 results) No results for input(s): "PROBNP" in the last 8760 hours. HbA1C: Recent Labs    06/12/22 0453  HGBA1C 5.4   CBG: No results for input(s): "GLUCAP" in the last 168 hours. Lipid Profile: Recent Labs    06/13/22 0451  CHOL 170  HDL 43  LDLCALC 112*  TRIG 76  CHOLHDL 4.0   Thyroid Function Tests: No results for input(s): "TSH", "T4TOTAL", "FREET4", "T3FREE", "THYROIDAB" in the last 72 hours. Anemia Panel: Recent Labs    06/12/22 0403 06/12/22 1012  VITAMINB12  --  142*  FOLATE 6.2  --   FERRITIN 39  --   TIBC 297  --   IRON 22*  --   RETICCTPCT 2.2  --    Sepsis Labs: Recent Labs  Lab 06/12/22 0558 06/12/22 1012 06/12/22 1300 06/13/22 0844  PROCALCITON <0.10  --   --   --   LATICACIDVEN  --   1.8 2.5* 2.1*    Recent Results (from the past 240 hour(s))  Resp panel by RT-PCR (RSV, Flu A&B, Covid) Anterior Nasal Swab     Status: None   Collection Time: 06/12/22  4:03 AM   Specimen: Anterior Nasal Swab  Result Value Ref Range Status   SARS Coronavirus 2 by RT PCR NEGATIVE NEGATIVE Final    Comment: (NOTE) SARS-CoV-2 target nucleic acids are NOT DETECTED.  The SARS-CoV-2 RNA is generally detectable in upper respiratory specimens during the acute phase of infection. The lowest concentration of SARS-CoV-2 viral copies this assay can detect is 138 copies/mL. A negative result does not preclude SARS-Cov-2 infection and should not be used as the sole basis for treatment or other patient management decisions. A negative result may occur with  improper specimen collection/handling, submission of specimen other than nasopharyngeal swab, presence of viral mutation(s) within the areas targeted by  this assay, and inadequate number of viral copies(<138 copies/mL). A negative result must be combined with clinical observations, patient history, and epidemiological information. The expected result is Negative.  Fact Sheet for Patients:  BloggerCourse.com  Fact Sheet for Healthcare Providers:  SeriousBroker.it  This test is no t yet approved or cleared by the [place] FDA and  has been authorized for detection and/or diagnosis of SARS-CoV-2 by FDA under an Emergency Use Authorization (EUA). This EUA will remain  in effect (meaning this test can be used) for the duration of the COVID-19 declaration under Section 564(b)(1) of the Act, 21 U.S.C.section 360bbb-3(b)(1), unless the authorization is terminated  or revoked sooner.       Influenza A by PCR NEGATIVE NEGATIVE Final   Influenza B by PCR NEGATIVE NEGATIVE Final    Comment: (NOTE) The Xpert Xpress SARS-CoV-2/FLU/RSV plus assay is intended as an aid in the diagnosis of  influenza from Nasopharyngeal swab specimens and should not be used as a sole basis for treatment. Nasal washings and aspirates are unacceptable for Xpert Xpress SARS-CoV-2/FLU/RSV testing.  Fact Sheet for Patients: BloggerCourse.com  Fact Sheet for Healthcare Providers: SeriousBroker.it  This test is not yet approved or cleared by the [place] FDA and has been authorized for detection and/or diagnosis of SARS-CoV-2 by FDA under an Emergency Use Authorization (EUA). This EUA will remain in effect (meaning this test can be used) for the duration of the COVID-19 declaration under Section 564(b)(1) of the Act, 21 U.S.C. section 360bbb-3(b)(1), unless the authorization is terminated or revoked.     Resp Syncytial Virus by PCR NEGATIVE NEGATIVE Final    Comment: (NOTE) Fact Sheet for Patients: BloggerCourse.com  Fact Sheet for Healthcare Providers: SeriousBroker.it  This test is not yet approved or cleared by the [place] FDA and has been authorized for detection and/or diagnosis of SARS-CoV-2 by FDA under an Emergency Use Authorization (EUA). This EUA will remain in effect (meaning this test can be used) for the duration of the COVID-19 declaration under Section 564(b)(1) of the Act, 21 U.S.C. section 360bbb-3(b)(1), unless the authorization is terminated or revoked.  Performed at [facility], [address], [place], [place] [address]          Radiology Studies: DG Chest Weed 1 View  Result Date: 06/12/2022 CLINICAL DATA:  Shortness of breath EXAM: PORTABLE CHEST 1 VIEW COMPARISON:  Chest CT 09/28/2021 FINDINGS: Diffuse interstitial opacity with Charyl Dancer lines. There is a background of hyperinflation and apical emphysema. Normal heart size and aortic contours. No effusion or pneumothorax. IMPRESSION: CHF superimposed on emphysema. Electronically Signed    By: Tiburcio Pea M.D.   On: 06/12/2022 04:31        Scheduled Meds:  arformoterol  15 mcg Nebulization BID   aspirin EC  81 mg Oral Daily   atorvastatin  40 mg Oral Daily   azithromycin  250 mg Oral Daily   budesonide (PULMICORT) nebulizer solution  0.25 mg Nebulization BID   ipratropium-albuterol  3 mL Nebulization Q4H   levothyroxine  88 mcg Oral QAC breakfast   methylPREDNISolone (SOLU-MEDROL) injection  40 mg Intravenous Q12H   nicotine  21 mg Transdermal Daily   Continuous Infusions:   LOS: 1 day      Tresa Moore, MD [facility]   If 7PM-7AM, please contact night-coverage  06/13/2022, 1:28 PM

## 2022-06-13 NOTE — ED Notes (Signed)
Pt ate entirety of meal tray. Pt requesting to attempt to use bed side commode to try to urinate.

## 2022-06-13 NOTE — Discharge Instructions (Signed)

## 2022-06-14 ENCOUNTER — Inpatient Hospital Stay: Payer: Medicare Other

## 2022-06-14 DIAGNOSIS — J9601 Acute respiratory failure with hypoxia: Secondary | ICD-10-CM | POA: Diagnosis not present

## 2022-06-14 LAB — CBC WITH DIFFERENTIAL/PLATELET
Abs Immature Granulocytes: 0.32 10*3/uL — ABNORMAL HIGH (ref 0.00–0.07)
Basophils Absolute: 0 10*3/uL (ref 0.0–0.1)
Basophils Relative: 0 %
Eosinophils Absolute: 0 10*3/uL (ref 0.0–0.5)
Eosinophils Relative: 0 %
HCT: 29.8 % — ABNORMAL LOW (ref 36.0–46.0)
Hemoglobin: 9.6 g/dL — ABNORMAL LOW (ref 12.0–15.0)
Immature Granulocytes: 1 %
Lymphocytes Relative: 5 %
Lymphs Abs: 1.2 10*3/uL (ref 0.7–4.0)
MCH: 30.1 pg (ref 26.0–34.0)
MCHC: 32.2 g/dL (ref 30.0–36.0)
MCV: 93.4 fL (ref 80.0–100.0)
Monocytes Absolute: 0.9 10*3/uL (ref 0.1–1.0)
Monocytes Relative: 4 %
Neutro Abs: 20.4 10*3/uL — ABNORMAL HIGH (ref 1.7–7.7)
Neutrophils Relative %: 90 %
Platelets: 410 10*3/uL — ABNORMAL HIGH (ref 150–400)
RBC: 3.19 MIL/uL — ABNORMAL LOW (ref 3.87–5.11)
RDW: 13.4 % (ref 11.5–15.5)
WBC: 22.9 10*3/uL — ABNORMAL HIGH (ref 4.0–10.5)
nRBC: 0 % (ref 0.0–0.2)

## 2022-06-14 LAB — BASIC METABOLIC PANEL
Anion gap: 4 — ABNORMAL LOW (ref 5–15)
BUN: 38 mg/dL — ABNORMAL HIGH (ref 8–23)
CO2: 22 mmol/L (ref 22–32)
Calcium: 8.7 mg/dL — ABNORMAL LOW (ref 8.9–10.3)
Chloride: 109 mmol/L (ref 98–111)
Creatinine, Ser: 0.85 mg/dL (ref 0.44–1.00)
GFR, Estimated: >60 mL/min (ref >60)
Glucose, Bld: 131 mg/dL — ABNORMAL HIGH (ref 70–99)
Potassium: 5.2 mmol/L — ABNORMAL HIGH (ref 3.5–5.1)
Sodium: 135 mmol/L (ref 135–145)

## 2022-06-14 MED ORDER — CYANOCOBALAMIN 1000 MCG/ML IJ SOLN
1000.0000 ug | Freq: Every day | INTRAMUSCULAR | Status: DC
  Administered 2022-06-14 – 2022-06-15 (×2): 1000 ug via INTRAMUSCULAR
  Filled 2022-06-14 (×2): qty 1

## 2022-06-14 NOTE — Progress Notes (Signed)
PROGRESS NOTE    Geraldo DockerDebra B Pusey  ZOX:[id number] DOB: [birth year] DOA: 06/12/2022 PCP: Gracelyn NurseJohnston, John D, MD    Brief Narrative:  71 y.o. female with medical history significant of COPD not on O2 at home, tobacco abuse, hypothyroidism, who presents with SOB for 2 days.  Further cough and mucoid sputum production.  She also had some chest pain associated with coughing. Initially with respiratory distress she was started on BiPAP.  Symptomatically improved. Still wheezing and cough.   Assessment & Plan:   Principal Problem:   Acute respiratory failure with hypoxia (HCC) Active Problems:   Acute CHF (congestive heart failure) (HCC)   COPD exacerbation (HCC)   NSTEMI (non-ST elevated myocardial infarction) (HCC)   Hypothyroidism   Hypokalemia   Normocytic anemia   Tobacco abuse  Acute respiratory failure with hypoxia Acute decompensated COPD Respiratory failure likely due to COPD.  IV steroids Bronchodilators Oxygen as necessary, wean as tolerated inhalational steroids, scheduled and as needed bronchodilators, deep breathing exercises, incentive spirometry, chest physiotherapy . Mobility. Supplemental oxygen to keep saturations more than 92%. BiPAP as needed for respiratory distress.  Elevated BNP/demand ischemia-elevated troponins: No prior history of heart failure.  BNP 8000 on admission.  Some interstitial opacities on chest x-ray.  Crackles on exam.  Otherwise overall euvolemic.  Status post IV Lasix 20 mg x 2 doses. 2D echocardiogram with ejection fraction 40 to 45%.   Followed by cardiology.  Anticipating conservative management.   Initially on heparin infusion , now discontinued.  Remains on aspirin, Lipitor.   Follow-up cardiology recommendations.    Acute kidney injury Resolved.  Tobacco abuse Current 1 pack a day smoker.  Continue nicotine patch.  Advised smoking cessation and patient is motivated.  Hypothyroidism Continue home Synthroid  Hypokalemia Replaced  and adequate.  Chronic normocytic anemia Hemoglobin 10.7.  No active bleeding. Folic acid 6.2 B12 142, very low Iron levels.  Iron 22, TIBC 297, ferritin 39. Will start aggressive replacement of B12.   DVT prophylaxis: SQ Lovenox Code Status: DNR Family Communication: None  Disposition Plan: Status is: Inpatient Remains inpatient appropriate because: Decompensated COPD.  Hypoxic respiratory failure.  Used BiPAP last night.   Level of care: Telemetry Medical  Consultants:  Cardiology  Procedures:  None  Antimicrobials: Azithromycin day 3.   Subjective: Patient was seen and examined.  Currently symptoms are controlled.  Overnight she had episode of shortness of breath and used BiPAP for about 2 hours.  Denies any chest pain.  Dry cough.  Objective: Vitals:   06/14/22 0448 06/14/22 0727 06/14/22 0733 06/14/22 1147  BP: 96/60 97/77  105/63  Pulse: 79 81  89  Resp: 18 18    Temp: 97.7 F (36.5 C) (!) 97.4 F (36.3 C)  98.4 F (36.9 C)  TempSrc:      SpO2: 94% 96% 95% 97%  Weight:      Height:       No intake or output data in the 24 hours ending 06/14/22 1302  Filed Weights   06/12/22 0404 06/14/22 0445  Weight: 53.5 kg 54.4 kg    Examination:  General exam: Appears calm and comfortable at rest. Frail.  Chronically sick looking.  Not in any distress. Respiratory system: Not in any respiratory distress.  Bilateral scattered wheezes.  On 4 L oxygen. Cardiovascular system: S1-S2, RRR, no murmurs, no pedal edema Gastrointestinal system: Soft, NT/ND, normal bowel sounds Central nervous system: Alert and oriented. No focal neurological deficits. Extremities: Symmetric 5 x 5 power.  Skin: No rashes, lesions or ulcers Psychiatry: Judgement and insight appear normal. Mood & affect appropriate.     Data Reviewed: I have personally reviewed following labs and imaging studies  CBC: Recent Labs  Lab 06/12/22 0403 06/12/22 1300 06/12/22 1811 06/13/22 0451  06/13/22 0847 06/14/22 0615  WBC 16.1* 8.6 8.1 17.0* 19.5* 22.9*  NEUTROABS 11.1*  --   --   --   --  20.4*  HGB 10.7* 10.4* 9.9* 9.5* 9.5* 9.6*  HCT 33.0* 32.9* 31.1* 29.9* 29.2* 29.8*  MCV 94.0 93.5 92.8 94.6 93.3 93.4  PLT 429* 431* 397 403* 403* 410*   Basic Metabolic Panel: Recent Labs  Lab 06/12/22 0403 06/13/22 0451 06/14/22 0615  NA 140 135 135  K 2.9* 4.7 5.2*  CL 107 106 109  CO2 24 22 22   GLUCOSE 160* 169* 131*  BUN 16 31* 38*  CREATININE 0.77 1.20* 0.85  CALCIUM 8.5* 8.5* 8.7*  MG 1.9 2.1  --    GFR: Estimated Creatinine Clearance: 48.8 mL/min (by C-G formula based on SCr of 0.85 mg/dL). Liver Function Tests: Recent Labs  Lab 06/12/22 0403  AST 38  ALT 21  ALKPHOS 72  BILITOT 0.9  PROT 6.5  ALBUMIN 3.5   No results for input(s): "LIPASE", "AMYLASE" in the last 168 hours. No results for input(s): "AMMONIA" in the last 168 hours. Coagulation Profile: Recent Labs  Lab 06/12/22 0558  INR 1.2   Cardiac Enzymes: No results for input(s): "CKTOTAL", "CKMB", "CKMBINDEX", "TROPONINI" in the last 168 hours. BNP (last 3 results) No results for input(s): "PROBNP" in the last 8760 hours. HbA1C: Recent Labs    06/12/22 0453  HGBA1C 5.4   CBG: No results for input(s): "GLUCAP" in the last 168 hours. Lipid Profile: Recent Labs    06/13/22 0451  CHOL 170  HDL 43  LDLCALC 112*  TRIG 76  CHOLHDL 4.0   Thyroid Function Tests: No results for input(s): "TSH", "T4TOTAL", "FREET4", "T3FREE", "THYROIDAB" in the last 72 hours. Anemia Panel: Recent Labs    06/12/22 0403 06/12/22 1012  VITAMINB12  --  142*  FOLATE 6.2  --   FERRITIN 39  --   TIBC 297  --   IRON 22*  --   RETICCTPCT 2.2  --    Sepsis Labs: Recent Labs  Lab 06/12/22 0558 06/12/22 1012 06/12/22 1300 06/13/22 0844  PROCALCITON <0.10  --   --   --   LATICACIDVEN  --  1.8 2.5* 2.1*    Recent Results (from the past 240 hour(s))  Resp panel by RT-PCR (RSV, Flu A&B, Covid) Anterior  Nasal Swab     Status: None   Collection Time: 06/12/22  4:03 AM   Specimen: Anterior Nasal Swab  Result Value Ref Range Status   SARS Coronavirus 2 by RT PCR NEGATIVE NEGATIVE Final    Comment: (NOTE) SARS-CoV-2 target nucleic acids are NOT DETECTED.  The SARS-CoV-2 RNA is generally detectable in upper respiratory specimens during the acute phase of infection. The lowest concentration of SARS-CoV-2 viral copies this assay can detect is 138 copies/mL. A negative result does not preclude SARS-Cov-2 infection and should not be used as the sole basis for treatment or other patient management decisions. A negative result may occur with  improper specimen collection/handling, submission of specimen other than nasopharyngeal swab, presence of viral mutation(s) within the areas targeted by this assay, and inadequate number of viral copies(<138 copies/mL). A negative result must be combined with clinical observations, patient history,  and epidemiological information. The expected result is Negative.  Fact Sheet for Patients:  BloggerCourse.com  Fact Sheet for Healthcare Providers:  SeriousBroker.it  This test is no t yet approved or cleared by the [place] FDA and  has been authorized for detection and/or diagnosis of SARS-CoV-2 by FDA under an Emergency Use Authorization (EUA). This EUA will remain  in effect (meaning this test can be used) for the duration of the COVID-19 declaration under Section 564(b)(1) of the Act, 21 U.S.C.section 360bbb-3(b)(1), unless the authorization is terminated  or revoked sooner.       Influenza A by PCR NEGATIVE NEGATIVE Final   Influenza B by PCR NEGATIVE NEGATIVE Final    Comment: (NOTE) The Xpert Xpress SARS-CoV-2/FLU/RSV plus assay is intended as an aid in the diagnosis of influenza from Nasopharyngeal swab specimens and should not be used as a sole basis for treatment. Nasal washings  and aspirates are unacceptable for Xpert Xpress SARS-CoV-2/FLU/RSV testing.  Fact Sheet for Patients: BloggerCourse.com  Fact Sheet for Healthcare Providers: SeriousBroker.it  This test is not yet approved or cleared by the [place] FDA and has been authorized for detection and/or diagnosis of SARS-CoV-2 by FDA under an Emergency Use Authorization (EUA). This EUA will remain in effect (meaning this test can be used) for the duration of the COVID-19 declaration under Section 564(b)(1) of the Act, 21 U.S.C. section 360bbb-3(b)(1), unless the authorization is terminated or revoked.     Resp Syncytial Virus by PCR NEGATIVE NEGATIVE Final    Comment: (NOTE) Fact Sheet for Patients: BloggerCourse.com  Fact Sheet for Healthcare Providers: SeriousBroker.it  This test is not yet approved or cleared by the [place] FDA and has been authorized for detection and/or diagnosis of SARS-CoV-2 by FDA under an Emergency Use Authorization (EUA). This EUA will remain in effect (meaning this test can be used) for the duration of the COVID-19 declaration under Section 564(b)(1) of the Act, 21 U.S.C. section 360bbb-3(b)(1), unless the authorization is terminated or revoked.  Performed at [facility], [address] [place], [place] [address]          Radiology Studies: ECHOCARDIOGRAM COMPLETE  Result Date: 06/13/2022    ECHOCARDIOGRAM REPORT   Patient Name:   ABAGALE BOULOS Date of Exam: 06/12/2022 Medical Rec #:  [id number]    Height:       63.0 in Accession #:    [id number]   Weight:       118.0 lb Date of Birth:  May 24, 1951    BSA:          1.545 m Patient Age:    71 years     BP:           93/62 mmHg Patient Gender: F            HR:           79 bpm. Exam Location:  [facility] Procedure: 2D Echo, Color Doppler and Cardiac Doppler Indications:    I50.31 congestive heart  failure-Acute Diastolic  History:        Patient has no prior history of Echocardiogram examinations.                 COPD.  Sonographer:    Humphrey Rolls Referring Phys: [id number] Brien Few NIU Diagnosing      Alwyn Pea MD Phys: IMPRESSIONS  1. Left ventricular ejection fraction, by estimation, is 40 to 45%. The left ventricle has mildly decreased function. The left ventricle has no  regional wall motion abnormalities. The left ventricular internal cavity size was mildly to moderately dilated. Left ventricular diastolic parameters are consistent with Grade I diastolic dysfunction (impaired relaxation).  2. Right ventricular systolic function is normal. The right ventricular size is normal.  3. The mitral valve is grossly normal. Mild to moderate mitral valve regurgitation.  4. Tricuspid valve regurgitation is mild to moderate. Mild to moderate tricuspid stenosis.  5. The aortic valve is normal in structure. Aortic valve regurgitation is trivial. FINDINGS  Left Ventricle: Left ventricular ejection fraction, by estimation, is 40 to 45%. The left ventricle has mildly decreased function. The left ventricle has no regional wall motion abnormalities. The left ventricular internal cavity size was mildly to moderately dilated. There is no left ventricular hypertrophy. Left ventricular diastolic parameters are consistent with Grade I diastolic dysfunction (impaired relaxation). Right Ventricle: The right ventricular size is normal. No increase in right ventricular wall thickness. Right ventricular systolic function is normal. Left Atrium: Left atrial size was normal in size. Right Atrium: Right atrial size was normal in size. Pericardium: There is no evidence of pericardial effusion. Mitral Valve: The mitral valve is grossly normal. Mild to moderate mitral valve regurgitation. Tricuspid Valve: The tricuspid valve is normal in structure. Tricuspid valve regurgitation is mild to moderate. Mild to moderate tricuspid stenosis.  Aortic Valve: The aortic valve is normal in structure. Aortic valve regurgitation is trivial. Aortic valve mean gradient measures 3.0 mmHg. Aortic valve peak gradient measures 6.5 mmHg. Aortic valve area, by VTI measures 1.61 cm. Pulmonic Valve: The pulmonic valve was normal in structure. Pulmonic valve regurgitation is not visualized. Aorta: The ascending aorta was not well visualized. IAS/Shunts: No atrial level shunt detected by color flow Doppler.  LEFT VENTRICLE PLAX 2D LVIDd:         5.00 cm     Diastology LVIDs:         3.90 cm     LV e' medial:    8.81 cm/s LV PW:         1.00 cm     LV E/e' medial:  12.8 LV IVS:        0.70 cm     LV e' lateral:   8.49 cm/s LVOT diam:     1.60 cm     LV E/e' lateral: 13.3 LV SV:         40 LV SV Index:   26 LVOT Area:     2.01 cm  LV Volumes (MOD) LV vol d, MOD A2C: 82.5 ml LV vol d, MOD A4C: 80.2 ml LV vol s, MOD A2C: 49.1 ml LV vol s, MOD A4C: 51.1 ml LV SV MOD A2C:     33.4 ml LV SV MOD A4C:     80.2 ml LV SV MOD BP:      30.1 ml RIGHT VENTRICLE RV Basal diam:  2.60 cm RV S prime:     13.30 cm/s TAPSE (M-mode): 1.9 cm LEFT ATRIUM             Index        RIGHT ATRIUM           Index LA diam:        3.80 cm 2.46 cm/m   RA Area:     10.00 cm LA Vol (A2C):   35.2 ml 22.78 ml/m  RA Volume:   19.30 ml  12.49 ml/m LA Vol (A4C):   32.2 ml 20.84 ml/m LA Biplane Vol: 36.4 ml 23.56  ml/m  AORTIC VALVE                    PULMONIC VALVE AV Area (Vmax):    1.58 cm     PV Vmax:       0.75 m/s AV Area (Vmean):   1.60 cm     PV Vmean:      50.400 cm/s AV Area (VTI):     1.61 cm     PV VTI:        0.142 m AV Vmax:           127.00 cm/s  PV Peak grad:  2.3 mmHg AV Vmean:          87.100 cm/s  PV Mean grad:  1.0 mmHg AV VTI:            0.248 m AV Peak Grad:      6.5 mmHg AV Mean Grad:      3.0 mmHg LVOT Vmax:         99.80 cm/s LVOT Vmean:        69.500 cm/s LVOT VTI:          0.198 m LVOT/AV VTI ratio: 0.80  AORTA Ao Root diam: 2.60 cm MITRAL VALVE MV Area (PHT): 3.95 cm      SHUNTS MV Decel Time: 192 msec     Systemic VTI:  0.20 m MR Peak grad: 80.6 mmHg     Systemic Diam: 1.60 cm MR Mean grad: 56.0 mmHg MR Vmax:      449.00 cm/s MR Vmean:     355.0 cm/s MV E velocity: 113.00 cm/s MV A velocity: 124.00 cm/s MV E/A ratio:  0.91 Dwayne D Callwood MD Electronically signed by Alwyn Pea MD Signature Date/Time: 06/13/2022/2:30:04 PM    Final         Scheduled Meds:  arformoterol  15 mcg Nebulization BID   aspirin EC  81 mg Oral Daily   atorvastatin  40 mg Oral Daily   azithromycin  250 mg Oral Daily   budesonide (PULMICORT) nebulizer solution  0.25 mg Nebulization BID   enoxaparin (LOVENOX) injection  40 mg Subcutaneous Q24H   ipratropium-albuterol  3 mL Nebulization Q6H   levothyroxine  88 mcg Oral QAC breakfast   methylPREDNISolone (SOLU-MEDROL) injection  40 mg Intravenous Q12H   nicotine  21 mg Transdermal Daily   Continuous Infusions:   LOS: 2 days      Dorcas Carrow, MD [facility]   If 7PM-7AM, please contact night-coverage  06/14/2022, 1:02 PM

## 2022-06-14 NOTE — Progress Notes (Incomplete)
At approximately 0000 patient ambulated in room to and from restroom on 2L O2. Patient rang call bell and RN entered room to find patient in visible respiratory distress, unable to speak in full sentences. O2 sat was found to be in the low 80s. O2 increased to 6L with sats remaining in the 80s. NRB applied at 10L and patient's sats increased to 90s with encouragement to slow breathing and focus on circular breathing. At 0015 RT placed pt back on bipap per pt request.

## 2022-06-14 NOTE — Consult Note (Addendum)
   Heart Failure Nurse Navigator Note  HFmrEF 40-45%.  Left ventricular internal cavity is mild to moderately dilated.  Grade 1 diastolic dysfunction.  Mild to moderate mitral regurgitation.  Mild to moderate tricuspid regurgitation.  She presented to the emergency room with complaints of shortness of breath x 2 days, cough and chest pain.  BNP was elevated at 1115, chest x-ray revealed pulmonary edema.  Comorbidities:  COPD not on home O2 Continued tobacco abuse Hypothyroidism  Medications:  Aspirin 81 mg daily Atorvastatin 40 mg daily Levothyroxine 88 mcg daily NicoDerm patch  Labs:  Sodium 135, potassium 5.2, chloride 109, CO2 22, BUN 38, creatinine 0.85, estimated GFR greater than 60, hemoglobin 9.6, hematocrit 29.8. 54.4 kg Intake not documented Output 200 mL  Initial meeting with patient, she is lying in bed currently in no acute distress.  States that she has not heard the term heart failure in regards to herself.  She states at this time that she does not know the results of her echocardiogram.  Explained to the patient that heart failure means that your heart as a pump cannot meet the demands of your body.  She voices understanding.  Explained to the patient systolic heart failure along with diastolic heart failure, the doctors would clarify which type that she had.  She voices understanding.  Over how she is going to take care of herself when she goes home with her heart failure.  Discussed daily weights and reporting 2 pound weight gain overnight or 5 pounds within the week or changes in her symptoms such as increasing shortness of breath, edema, PND orthopnea etc.  Also discussed sodium restriction of no more than 2000 mg of sodium daily.  Stressed the importance of removing the saltshaker from the table.  She states that most of her meals are taken at home and she cooks with fresh vegetables.  She states she also likes to make dried beans. They make their own homemade  soup from the juices of  chicken or beef that is used for the broth.  She states that Friday is payday they do like to go out and eat at restaurants on that day.  Went over making wise choices when eating out, she voices understanding.  Made aware of fluid restriction --of 64 ounces daily. And what that includes.  She states she is very motivated to stop smoking, " she states she should have done it long ago."  Made aware of follow up in the out patient clinic has an appointment on December 29 at 1230.  She has a 4% no-show which is 1 out of 15 appointments.  She was given the living with heart failure teaching booklet, zone magnet, info on low-sodium and heart failure along with weight chart.  She had no further questions.  Tresa Endo RN [facility]

## 2022-06-15 DIAGNOSIS — J9601 Acute respiratory failure with hypoxia: Secondary | ICD-10-CM | POA: Diagnosis not present

## 2022-06-15 LAB — CBC WITH DIFFERENTIAL/PLATELET
Abs Immature Granulocytes: 0.25 10*3/uL — ABNORMAL HIGH (ref 0.00–0.07)
Basophils Absolute: 0 10*3/uL (ref 0.0–0.1)
Basophils Relative: 0 %
Eosinophils Absolute: 0 10*3/uL (ref 0.0–0.5)
Eosinophils Relative: 0 %
HCT: 31.2 % — ABNORMAL LOW (ref 36.0–46.0)
Hemoglobin: 10 g/dL — ABNORMAL LOW (ref 12.0–15.0)
Immature Granulocytes: 1 %
Lymphocytes Relative: 6 %
Lymphs Abs: 1.2 10*3/uL (ref 0.7–4.0)
MCH: 29.8 pg (ref 26.0–34.0)
MCHC: 32.1 g/dL (ref 30.0–36.0)
MCV: 92.9 fL (ref 80.0–100.0)
Monocytes Absolute: 0.8 10*3/uL (ref 0.1–1.0)
Monocytes Relative: 4 %
Neutro Abs: 16.4 10*3/uL — ABNORMAL HIGH (ref 1.7–7.7)
Neutrophils Relative %: 89 %
Platelets: 435 10*3/uL — ABNORMAL HIGH (ref 150–400)
RBC: 3.36 MIL/uL — ABNORMAL LOW (ref 3.87–5.11)
RDW: 13.3 % (ref 11.5–15.5)
WBC: 18.7 10*3/uL — ABNORMAL HIGH (ref 4.0–10.5)
nRBC: 0 % (ref 0.0–0.2)

## 2022-06-15 LAB — COMPREHENSIVE METABOLIC PANEL
ALT: 22 U/L (ref 0–44)
AST: 23 U/L (ref 15–41)
Albumin: 3.4 g/dL — ABNORMAL LOW (ref 3.5–5.0)
Alkaline Phosphatase: 60 U/L (ref 38–126)
Anion gap: 7 (ref 5–15)
BUN: 36 mg/dL — ABNORMAL HIGH (ref 8–23)
CO2: 24 mmol/L (ref 22–32)
Calcium: 8.8 mg/dL — ABNORMAL LOW (ref 8.9–10.3)
Chloride: 107 mmol/L (ref 98–111)
Creatinine, Ser: 0.85 mg/dL (ref 0.44–1.00)
GFR, Estimated: >60 mL/min (ref >60)
Glucose, Bld: 136 mg/dL — ABNORMAL HIGH (ref 70–99)
Potassium: 4.8 mmol/L (ref 3.5–5.1)
Sodium: 138 mmol/L (ref 135–145)
Total Bilirubin: 0.4 mg/dL (ref 0.3–1.2)
Total Protein: 6.2 g/dL — ABNORMAL LOW (ref 6.5–8.1)

## 2022-06-15 LAB — MAGNESIUM: Magnesium: 2.4 mg/dL (ref 1.7–2.4)

## 2022-06-15 MED ORDER — PREDNISONE 20 MG PO TABS: 20.0000 mg | ORAL_TABLET | Freq: Every day | ORAL | 0 refills | Status: AC

## 2022-06-15 MED ORDER — PREDNISONE 20 MG PO TABS: 40.0000 mg | ORAL_TABLET | Freq: Every day | ORAL | Status: DC

## 2022-06-15 MED ORDER — VITAMIN B-12 1000 MCG PO TABS: 1000.0000 ug | ORAL_TABLET | Freq: Every day | ORAL | 0 refills | Status: AC

## 2022-06-15 MED ORDER — DM-GUAIFENESIN ER 30-600 MG PO TB12: 1.0000 | ORAL_TABLET | Freq: Two times a day (BID) | ORAL | 0 refills | Status: AC | PRN

## 2022-06-15 MED ORDER — ATORVASTATIN CALCIUM 40 MG PO TABS: 40.0000 mg | ORAL_TABLET | Freq: Every day | ORAL | 0 refills | Status: DC

## 2022-06-15 MED ORDER — ALBUTEROL SULFATE (2.5 MG/3ML) 0.083% IN NEBU: 2.5000 mg | INHALATION_SOLUTION | RESPIRATORY_TRACT | 12 refills | Status: AC | PRN

## 2022-06-15 MED ORDER — ASPIRIN 81 MG PO TBEC: 81.0000 mg | DELAYED_RELEASE_TABLET | Freq: Every day | ORAL | 12 refills | Status: AC

## 2022-06-15 MED ORDER — NICOTINE 21 MG/24HR TD PT24: 21.0000 mg | MEDICATED_PATCH | Freq: Every day | TRANSDERMAL | 0 refills | Status: DC

## 2022-06-15 MED ORDER — CYANOCOBALAMIN 1000 MCG/ML IJ SOLN
1000.0000 ug | Freq: Once | INTRAMUSCULAR | Status: DC
  Filled 2022-06-15: qty 1

## 2022-06-15 MED ORDER — AZITHROMYCIN 250 MG PO TABS: 250.0000 mg | ORAL_TABLET | Freq: Every day | ORAL | 0 refills | Status: AC

## 2022-06-15 MED ORDER — FUROSEMIDE 40 MG PO TABS
40.0000 mg | ORAL_TABLET | Freq: Every day | ORAL | Status: DC
  Administered 2022-06-15: 40 mg via ORAL
  Filled 2022-06-15: qty 1

## 2022-06-15 MED ORDER — FUROSEMIDE 40 MG PO TABS: 40.0000 mg | ORAL_TABLET | Freq: Every day | ORAL | 0 refills | Status: DC

## 2022-06-15 NOTE — Progress Notes (Signed)
SATURATION QUALIFICATIONS: (This note is used to comply with regulatory documentation for home oxygen)  Patient Saturations on Room Air at Rest = 95%  Patient Saturations on Room Air while Ambulating = 93%  Patient Saturations on 0 Liters of oxygen while Ambulating = 93%  Please briefly explain why patient needs home oxygen: 

## 2022-06-15 NOTE — Progress Notes (Signed)
Pt d/c'd home with husband. IV removed intact. VSS. Education completed. Pt has asked for nebulizer to be delivered to house. She and MD are aware that it will not be delivered until Monday and they are both OK with that plan. All belongings sent with pt. All questions answered.

## 2022-06-15 NOTE — TOC Transition Note (Addendum)
Transition of Care [facility]) - CM/SW Discharge Note   Patient Details  Name: Denise Allen MRN: [id number] Date of Birth: 1951-03-29  [facility] CM/SW Contact:  Liliana Cline, LCSW Phone Number: 06/15/2022, 9:57 AM   Clinical Narrative:    Patient has orders to DC home today. Confirmed with RN and MD, no home o2 needs. Needs nebulizer. Nebulizer ordered through [place] with [employer] who is aware of DC today. Patient prefers home delivery and is aware it would not be delivered until Monday. MD and RN also aware.    Final next level of care: Home/Self Care Barriers to Discharge: Barriers Resolved   Patient Goals and CMS Choice Patient states their goals for this hospitalization and ongoing recovery are:: hopes to not have to go home on oxygen      Discharge Placement                       Discharge Plan and Services   Discharge Planning Services: CM Consult            DME Arranged: Nebulizer machine DME Agency: [employer] Date DME Agency Contacted: 06/15/22   Representative spoke with at DME Agency: Leavy Cella HH Arranged: NA HH Agency: NA        Social Determinants of Health (SDOH) Interventions     Readmission Risk Interventions     No data to display

## 2022-06-15 NOTE — Discharge Summary (Signed)
Physician Discharge Summary  Denise Allen [id number] DOB: 1951-07-01 DOA: 06/12/2022  PCP: Baxter Hire, MD  Admit date: 06/12/2022 Discharge date: 06/15/2022  Admitted From: Home Disposition: Home  Recommendations for Outpatient Follow-up:  Follow up with PCP in 1-2 weeks Please obtain BMP/CBC in one week Refer to cardiology and pulmonary clinic for follow-up.  Home Health: Not applicable Equipment/Devices: Not applicable  Discharge Condition: Fair CODE STATUS: DNR Diet recommendation: Low-salt diet  Discharge summary: 71 y.o. female with medical history significant of COPD not on O2 at home, tobacco abuse, hypothyroidism, who presented to the ER with SOB for 2 days.  cough and mucoid sputum production.  She also had some chest pain associated with coughing. Initially with respiratory distress she was started on BiPAP.  Symptomatically improved. Weaned off oxygen.  Clinically improved.  Going home today.   Acute respiratory failure with hypoxia Acute exacerbation of COPD  Respiratory failure likely due to COPD.  Treated with aggressive bronchodilator therapy, inhalational steroids, IV steroids and subsequently with oral steroids.  Initially on oxygen and now on room air. No evidence of infection.  Repeat chest x-ray stable. Today on room air on mobility. Plan: Short course of steroids taper.  Complete 5 days of azithromycin. Nebulizer therapy with albuterol inhaler and nebulizer. No formal diagnosis of COPD, will need outpatient PFTs.   Acute on chronic combined heart failure.  Elevated BNP/demand ischemia-elevated troponins: No prior history of heart failure.  BNP 8000 on admission.  Some interstitial opacities on chest x-ray.  Crackles on exam.  2D echocardiogram with ejection fraction 40 to 45%.   Followed by cardiology.  Clinically improved. Cardiology will schedule outpatient follow-up. Discharged home on aspirin 81 mg daily, Lipitor.  Tobacco abuse Current 1  pack a day smoker.  Continue nicotine patch.  Advised smoking cessation and patient is motivated. Prescribed nicotine patch.   Hypothyroidism Continue home Synthroid   Hypokalemia Replaced and adequate.   Chronic normocytic anemia Hemoglobin 10.7.  No active bleeding. Folic acid 6.2 [id number], very low Iron levels.  Iron 22, TIBC 297, ferritin 39. Given injectable cyanocobalamin 1000 mcg daily x 2.  Will prescribe oral cyanocobalamin 1000 mg daily to continue and recheck in 1 month.   Medically stable for discharge.  Discharge Diagnoses:  Principal Problem:   Acute respiratory failure with hypoxia (HCC) Active Problems:   Acute CHF (congestive heart failure) (HCC)   COPD exacerbation (HCC)   NSTEMI (non-ST elevated myocardial infarction) (HCC)   Hypothyroidism   Hypokalemia   Normocytic anemia   Tobacco abuse    Discharge Instructions  Discharge Instructions     Diet - low sodium heart healthy   Complete by: As directed    For home use only DME Nebulizer machine   Complete by: As directed    Patient needs a nebulizer to treat with the following condition: COPD with acute exacerbation ([place])   Length of Need: Lifetime   Increase activity slowly   Complete by: As directed       Allergies as of 06/15/2022       Reactions   Penicillins Other (See Comments)   Patient states "NOT ALLERGIC" causes severe yeast infection. Prefers NOT to use it.        Medication List     STOP taking these medications    ALBUTEROL IN   clobetasol cream 0.05 % Commonly known as: TEMOVATE       TAKE these medications    albuterol 108 (90 Base)  MCG/ACT inhaler Commonly known as: VENTOLIN HFA Inhale 2 puffs into the lungs every 4 (four) hours as needed for wheezing or shortness of breath. What changed: Another medication with the same name was added. Make sure you understand how and when to take each.   albuterol (2.5 MG/3ML) 0.083% nebulizer solution Commonly known as:  PROVENTIL Take 3 mLs (2.5 mg total) by nebulization every 4 (four) hours as needed for wheezing or shortness of breath. What changed: You were already taking a medication with the same name, and this prescription was added. Make sure you understand how and when to take each.   aspirin EC 81 MG tablet Take 1 tablet (81 mg total) by mouth daily. Swallow whole. Start taking on: June 16, 2022   atorvastatin 40 MG tablet Commonly known as: LIPITOR Take 1 tablet (40 mg total) by mouth daily. Start taking on: June 16, 2022   azithromycin 250 MG tablet Commonly known as: ZITHROMAX Take 1 tablet (250 mg total) by mouth daily for 2 days.   budesonide-formoterol 80-4.5 MCG/ACT inhaler Commonly known as: SYMBICORT Inhale 2 puffs into the lungs 2 (two) times daily.   cyanocobalamin 1000 MCG tablet Commonly known as: VITAMIN B12 Take 1 tablet (1,000 mcg total) by mouth daily.   dextromethorphan-guaiFENesin 30-600 MG 12hr tablet Commonly known as: MUCINEX DM Take 1 tablet by mouth 2 (two) times daily as needed for up to 14 days for cough.   diclofenac Sodium 1 % Gel Commonly known as: VOLTAREN Apply 2 g topically in the morning, at noon, and at bedtime. To affected joint.   furosemide 40 MG tablet Commonly known as: LASIX Take 1 tablet (40 mg total) by mouth daily. Start taking on: June 16, 2022   ibuprofen 600 MG tablet Commonly known as: ADVIL Take 1,200 mg by mouth daily.   levothyroxine 75 MCG tablet Commonly known as: SYNTHROID Take 88 mcg by mouth daily before breakfast.   nicotine 21 mg/24hr patch Commonly known as: NICODERM CQ - dosed in mg/24 hours Place 1 patch (21 mg total) onto the skin daily. Start taking on: June 16, 2022   predniSONE 20 MG tablet Commonly known as: DELTASONE Take 1 tablet (20 mg total) by mouth daily with breakfast for 3 days. Start taking on: June 16, 2022               Durable Medical Equipment  (From admission,  onward)           Start     Ordered   06/15/22 0000  For home use only DME Nebulizer machine       Question Answer Comment  Patient needs a nebulizer to treat with the following condition COPD with acute exacerbation ([place])   Length of Need Lifetime      06/15/22 0918            Allergies  Allergen Reactions   Penicillins Other (See Comments)    Patient states "NOT ALLERGIC" causes severe yeast infection. Prefers NOT to use it.    Consultations: Cardiology   Procedures/Studies: DG Chest Port 1 View  Result Date: 06/14/2022 CLINICAL DATA:  Difficulty breathing EXAM: PORTABLE CHEST 1 VIEW COMPARISON:  Previous studies including the examination of 06/12/2022 FINDINGS: Transverse diameter of heart is increased. There is interval decrease in interstitial markings in parahilar regions on both sides suggesting decreasing pulmonary edema. Residual increased interstitial markings are seen in both lower lung fields. There is interval increase in amount of bilateral pleural effusions. Evaluation  of lower lung fields for infiltrates is limited by the effusions. There is no pneumothorax. IMPRESSION: There is interval decrease in interstitial markings in both parahilar regions suggesting resolving interstitial pulmonary edema. Residual interstitial edema is seen in both lower lung fields. There is interval increase in size of small bilateral pleural effusions. Electronically Signed   By: Ernie Avena M.D.   On: 06/14/2022 14:26   ECHOCARDIOGRAM COMPLETE  Result Date: 06/13/2022    ECHOCARDIOGRAM REPORT   Patient Name:   Denise Allen Date of Exam: 06/12/2022 Medical Rec #:  [id number]    Height:       63.0 in Accession #:    [id number]   Weight:       118.0 lb Date of Birth:  1950-08-21    BSA:          1.545 m Patient Age:    71 years     BP:           93/62 mmHg Patient Gender: F            HR:           79 bpm. Exam Location:  [facility] Procedure: 2D Echo, Color Doppler and Cardiac Doppler  Indications:    I50.31 congestive heart failure-Acute Diastolic  History:        Patient has no prior history of Echocardiogram examinations.                 COPD.  Sonographer:    Humphrey Rolls Referring Phys: [id number] Brien Few NIU Diagnosing      Alwyn Pea MD Phys: IMPRESSIONS  1. Left ventricular ejection fraction, by estimation, is 40 to 45%. The left ventricle has mildly decreased function. The left ventricle has no regional wall motion abnormalities. The left ventricular internal cavity size was mildly to moderately dilated. Left ventricular diastolic parameters are consistent with Grade I diastolic dysfunction (impaired relaxation).  2. Right ventricular systolic function is normal. The right ventricular size is normal.  3. The mitral valve is grossly normal. Mild to moderate mitral valve regurgitation.  4. Tricuspid valve regurgitation is mild to moderate. Mild to moderate tricuspid stenosis.  5. The aortic valve is normal in structure. Aortic valve regurgitation is trivial. FINDINGS  Left Ventricle: Left ventricular ejection fraction, by estimation, is 40 to 45%. The left ventricle has mildly decreased function. The left ventricle has no regional wall motion abnormalities. The left ventricular internal cavity size was mildly to moderately dilated. There is no left ventricular hypertrophy. Left ventricular diastolic parameters are consistent with Grade I diastolic dysfunction (impaired relaxation). Right Ventricle: The right ventricular size is normal. No increase in right ventricular wall thickness. Right ventricular systolic function is normal. Left Atrium: Left atrial size was normal in size. Right Atrium: Right atrial size was normal in size. Pericardium: There is no evidence of pericardial effusion. Mitral Valve: The mitral valve is grossly normal. Mild to moderate mitral valve regurgitation. Tricuspid Valve: The tricuspid valve is normal in structure. Tricuspid valve regurgitation is mild to moderate.  Mild to moderate tricuspid stenosis. Aortic Valve: The aortic valve is normal in structure. Aortic valve regurgitation is trivial. Aortic valve mean gradient measures 3.0 mmHg. Aortic valve peak gradient measures 6.5 mmHg. Aortic valve area, by VTI measures 1.61 cm. Pulmonic Valve: The pulmonic valve was normal in structure. Pulmonic valve regurgitation is not visualized. Aorta: The ascending aorta was not well visualized. IAS/Shunts: No atrial level shunt detected by color flow Doppler.  LEFT  VENTRICLE PLAX 2D LVIDd:         5.00 cm     Diastology LVIDs:         3.90 cm     LV e' medial:    8.81 cm/s LV PW:         1.00 cm     LV E/e' medial:  12.8 LV IVS:        0.70 cm     LV e' lateral:   8.49 cm/s LVOT diam:     1.60 cm     LV E/e' lateral: 13.3 LV SV:         40 LV SV Index:   26 LVOT Area:     2.01 cm  LV Volumes (MOD) LV vol d, MOD A2C: 82.5 ml LV vol d, MOD A4C: 80.2 ml LV vol s, MOD A2C: 49.1 ml LV vol s, MOD A4C: 51.1 ml LV SV MOD A2C:     33.4 ml LV SV MOD A4C:     80.2 ml LV SV MOD BP:      30.1 ml RIGHT VENTRICLE RV Basal diam:  2.60 cm RV S prime:     13.30 cm/s TAPSE (M-mode): 1.9 cm LEFT ATRIUM             Index        RIGHT ATRIUM           Index LA diam:        3.80 cm 2.46 cm/m   RA Area:     10.00 cm LA Vol (A2C):   35.2 ml 22.78 ml/m  RA Volume:   19.30 ml  12.49 ml/m LA Vol (A4C):   32.2 ml 20.84 ml/m LA Biplane Vol: 36.4 ml 23.56 ml/m  AORTIC VALVE                    PULMONIC VALVE AV Area (Vmax):    1.58 cm     PV Vmax:       0.75 m/s AV Area (Vmean):   1.60 cm     PV Vmean:      50.400 cm/s AV Area (VTI):     1.61 cm     PV VTI:        0.142 m AV Vmax:           127.00 cm/s  PV Peak grad:  2.3 mmHg AV Vmean:          87.100 cm/s  PV Mean grad:  1.0 mmHg AV VTI:            0.248 m AV Peak Grad:      6.5 mmHg AV Mean Grad:      3.0 mmHg LVOT Vmax:         99.80 cm/s LVOT Vmean:        69.500 cm/s LVOT VTI:          0.198 m LVOT/AV VTI ratio: 0.80  AORTA Ao Root diam: 2.60 cm MITRAL  VALVE MV Area (PHT): 3.95 cm     SHUNTS MV Decel Time: 192 msec     Systemic VTI:  0.20 m MR Peak grad: 80.6 mmHg     Systemic Diam: 1.60 cm MR Mean grad: 56.0 mmHg MR Vmax:      449.00 cm/s MR Vmean:     355.0 cm/s MV E velocity: 113.00 cm/s MV A velocity: 124.00 cm/s MV E/A ratio:  0.91 Yolonda Kida MD Electronically signed by  Yolonda Kida MD Signature Date/Time: 06/13/2022/2:30:04 PM    Final    DG Chest Port 1 View  Result Date: 06/12/2022 CLINICAL DATA:  Shortness of breath EXAM: PORTABLE CHEST 1 VIEW COMPARISON:  Chest CT 09/28/2021 FINDINGS: Diffuse interstitial opacity with Kerley lines. There is a background of hyperinflation and apical emphysema. Normal heart size and aortic contours. No effusion or pneumothorax. IMPRESSION: CHF superimposed on emphysema. Electronically Signed   By: Jorje Guild M.D.   On: 06/12/2022 04:31   (Echo, Carotid, EGD, Colonoscopy, ERCP)    Subjective: Patient seen in the morning rounds.  Up about and walking in the hallway.  Not needing any oxygen.  Has some dry cough but denies any other complaints.  Eager to go home.   Discharge Exam: Vitals:   06/15/22 0723 06/15/22 0733  BP:  102/65  Pulse:  77  Resp:  18  Temp:  97.7 F (36.5 C)  SpO2: 98% 99%   Vitals:   06/14/22 2308 06/15/22 0505 06/15/22 0723 06/15/22 0733  BP: 97/70 110/69  102/65  Pulse: 79 83  77  Resp: 18 18  18   Temp: 97.6 F (36.4 C) (!) 97.5 F (36.4 C)  97.7 F (36.5 C)  TempSrc:    Oral  SpO2: 97% 99% 98% 99%  Weight:  54.2 kg    Height:        General: Pt is alert, awake, not in acute distress Cardiovascular: RRR, S1/S2 +, no rubs, no gallops Respiratory: CTA bilaterally, no wheezing, no rhonchi Abdominal: Soft, NT, ND, bowel sounds + Extremities: no edema, no cyanosis    The results of significant diagnostics from this hospitalization (including imaging, microbiology, ancillary and laboratory) are listed below for reference.      Microbiology: Recent Results (from the past 240 hour(s))  Resp panel by RT-PCR (RSV, Flu A&B, Covid) Anterior Nasal Swab     Status: None   Collection Time: 06/12/22  4:03 AM   Specimen: Anterior Nasal Swab  Result Value Ref Range Status   SARS Coronavirus 2 by RT PCR NEGATIVE NEGATIVE Final    Comment: (NOTE) SARS-CoV-2 target nucleic acids are NOT DETECTED.  The SARS-CoV-2 RNA is generally detectable in upper respiratory specimens during the acute phase of infection. The lowest concentration of SARS-CoV-2 viral copies this assay can detect is 138 copies/mL. A negative result does not preclude SARS-Cov-2 infection and should not be used as the sole basis for treatment or other patient management decisions. A negative result may occur with  improper specimen collection/handling, submission of specimen other than nasopharyngeal swab, presence of viral mutation(s) within the areas targeted by this assay, and inadequate number of viral copies(<138 copies/mL). A negative result must be combined with clinical observations, patient history, and epidemiological information. The expected result is Negative.  Fact Sheet for Patients:  EntrepreneurPulse.com.au  Fact Sheet for Healthcare Providers:  IncredibleEmployment.be  This test is no t yet approved or cleared by the [place] FDA and  has been authorized for detection and/or diagnosis of SARS-CoV-2 by FDA under an Emergency Use Authorization (EUA). This EUA will remain  in effect (meaning this test can be used) for the duration of the COVID-19 declaration under Section 564(b)(1) of the Act, 21 U.S.C.section 360bbb-3(b)(1), unless the authorization is terminated  or revoked sooner.       Influenza A by PCR NEGATIVE NEGATIVE Final   Influenza B by PCR NEGATIVE NEGATIVE Final    Comment: (NOTE) The Xpert Xpress SARS-CoV-2/FLU/RSV plus assay  is intended as an aid in the diagnosis of  influenza from Nasopharyngeal swab specimens and should not be used as a sole basis for treatment. Nasal washings and aspirates are unacceptable for Xpert Xpress SARS-CoV-2/FLU/RSV testing.  Fact Sheet for Patients: EntrepreneurPulse.com.au  Fact Sheet for Healthcare Providers: IncredibleEmployment.be  This test is not yet approved or cleared by the [place] FDA and has been authorized for detection and/or diagnosis of SARS-CoV-2 by FDA under an Emergency Use Authorization (EUA). This EUA will remain in effect (meaning this test can be used) for the duration of the COVID-19 declaration under Section 564(b)(1) of the Act, 21 U.S.C. section 360bbb-3(b)(1), unless the authorization is terminated or revoked.     Resp Syncytial Virus by PCR NEGATIVE NEGATIVE Final    Comment: (NOTE) Fact Sheet for Patients: EntrepreneurPulse.com.au  Fact Sheet for Healthcare Providers: IncredibleEmployment.be  This test is not yet approved or cleared by the [place] FDA and has been authorized for detection and/or diagnosis of SARS-CoV-2 by FDA under an Emergency Use Authorization (EUA). This EUA will remain in effect (meaning this test can be used) for the duration of the COVID-19 declaration under Section 564(b)(1) of the Act, 21 U.S.C. section 360bbb-3(b)(1), unless the authorization is terminated or revoked.  Performed at [facility], [address] [place], [place] [address]      Labs: BNP (last 3 results) Recent Labs    06/12/22 0403  BNP 99991111*   Basic Metabolic Panel: Recent Labs  Lab 06/12/22 0403 06/13/22 0451 06/14/22 0615 06/15/22 0432  NA 140 135 135 138  K 2.9* 4.7 5.2* 4.8  CL 107 106 109 107  CO2 24 22 22 24   GLUCOSE 160* 169* 131* 136*  BUN 16 31* 38* 36*  CREATININE 0.77 1.20* 0.85 0.85  CALCIUM 8.5* 8.5* 8.7* 8.8*  MG 1.9 2.1  --  2.4   Liver Function  Tests: Recent Labs  Lab 06/12/22 0403 06/15/22 0432  AST 38 23  ALT 21 22  ALKPHOS 72 60  BILITOT 0.9 0.4  PROT 6.5 6.2*  ALBUMIN 3.5 3.4*   No results for input(s): "LIPASE", "AMYLASE" in the last 168 hours. No results for input(s): "AMMONIA" in the last 168 hours. CBC: Recent Labs  Lab 06/12/22 0403 06/12/22 1300 06/12/22 1811 06/13/22 0451 06/13/22 0847 06/14/22 0615 06/15/22 0432  WBC 16.1*   < > 8.1 17.0* 19.5* 22.9* 18.7*  NEUTROABS 11.1*  --   --   --   --  20.4* 16.4*  HGB 10.7*   < > 9.9* 9.5* 9.5* 9.6* 10.0*  HCT 33.0*   < > 31.1* 29.9* 29.2* 29.8* 31.2*  MCV 94.0   < > 92.8 94.6 93.3 93.4 92.9  PLT 429*   < > 397 403* 403* 410* 435*   < > = values in this interval not displayed.   Cardiac Enzymes: No results for input(s): "CKTOTAL", "CKMB", "CKMBINDEX", "TROPONINI" in the last 168 hours. BNP: Invalid input(s): "POCBNP" CBG: No results for input(s): "GLUCAP" in the last 168 hours. D-Dimer No results for input(s): "DDIMER" in the last 72 hours. Hgb A1c No results for input(s): "HGBA1C" in the last 72 hours. Lipid Profile Recent Labs    06/13/22 0451  CHOL 170  HDL 43  LDLCALC 112*  TRIG 76  CHOLHDL 4.0   Thyroid function studies No results for input(s): "TSH", "T4TOTAL", "T3FREE", "THYROIDAB" in the last 72 hours.  Invalid input(s): "FREET3" Anemia work up No results for input(s): "VITAMINB12", "FOLATE", "FERRITIN", "  TIBC", "IRON", "RETICCTPCT" in the last 72 hours. Urinalysis No results found for: "COLORURINE", "APPEARANCEUR", "LABSPEC", "PHURINE", "GLUCOSEU", "HGBUR", "BILIRUBINUR", "KETONESUR", "PROTEINUR", "UROBILINOGEN", "NITRITE", "LEUKOCYTESUR" Sepsis Labs Recent Labs  Lab 06/13/22 0451 06/13/22 0847 06/14/22 0615 06/15/22 0432  WBC 17.0* 19.5* 22.9* 18.7*   Microbiology Recent Results (from the past 240 hour(s))  Resp panel by RT-PCR (RSV, Flu A&B, Covid) Anterior Nasal Swab     Status: None   Collection Time: 06/12/22  4:03 AM    Specimen: Anterior Nasal Swab  Result Value Ref Range Status   SARS Coronavirus 2 by RT PCR NEGATIVE NEGATIVE Final    Comment: (NOTE) SARS-CoV-2 target nucleic acids are NOT DETECTED.  The SARS-CoV-2 RNA is generally detectable in upper respiratory specimens during the acute phase of infection. The lowest concentration of SARS-CoV-2 viral copies this assay can detect is 138 copies/mL. A negative result does not preclude SARS-Cov-2 infection and should not be used as the sole basis for treatment or other patient management decisions. A negative result may occur with  improper specimen collection/handling, submission of specimen other than nasopharyngeal swab, presence of viral mutation(s) within the areas targeted by this assay, and inadequate number of viral copies(<138 copies/mL). A negative result must be combined with clinical observations, patient history, and epidemiological information. The expected result is Negative.  Fact Sheet for Patients:  EntrepreneurPulse.com.au  Fact Sheet for Healthcare Providers:  IncredibleEmployment.be  This test is no t yet approved or cleared by the [place] FDA and  has been authorized for detection and/or diagnosis of SARS-CoV-2 by FDA under an Emergency Use Authorization (EUA). This EUA will remain  in effect (meaning this test can be used) for the duration of the COVID-19 declaration under Section 564(b)(1) of the Act, 21 U.S.C.section 360bbb-3(b)(1), unless the authorization is terminated  or revoked sooner.       Influenza A by PCR NEGATIVE NEGATIVE Final   Influenza B by PCR NEGATIVE NEGATIVE Final    Comment: (NOTE) The Xpert Xpress SARS-CoV-2/FLU/RSV plus assay is intended as an aid in the diagnosis of influenza from Nasopharyngeal swab specimens and should not be used as a sole basis for treatment. Nasal washings and aspirates are unacceptable for Xpert Xpress  SARS-CoV-2/FLU/RSV testing.  Fact Sheet for Patients: EntrepreneurPulse.com.au  Fact Sheet for Healthcare Providers: IncredibleEmployment.be  This test is not yet approved or cleared by the [place] FDA and has been authorized for detection and/or diagnosis of SARS-CoV-2 by FDA under an Emergency Use Authorization (EUA). This EUA will remain in effect (meaning this test can be used) for the duration of the COVID-19 declaration under Section 564(b)(1) of the Act, 21 U.S.C. section 360bbb-3(b)(1), unless the authorization is terminated or revoked.     Resp Syncytial Virus by PCR NEGATIVE NEGATIVE Final    Comment: (NOTE) Fact Sheet for Patients: EntrepreneurPulse.com.au  Fact Sheet for Healthcare Providers: IncredibleEmployment.be  This test is not yet approved or cleared by the [place] FDA and has been authorized for detection and/or diagnosis of SARS-CoV-2 by FDA under an Emergency Use Authorization (EUA). This EUA will remain in effect (meaning this test can be used) for the duration of the COVID-19 declaration under Section 564(b)(1) of the Act, 21 U.S.C. section 360bbb-3(b)(1), unless the authorization is terminated or revoked.  Performed at [facility], [address], [place], [place] [address]      Time coordinating discharge: 35 minutes  SIGNED:   Barb Merino, MD  [facility] 06/15/2022, 12:51 PM

## 2022-06-28 ENCOUNTER — Ambulatory Visit (HOSPITAL_BASED_OUTPATIENT_CLINIC_OR_DEPARTMENT_OTHER): Payer: Medicare Other | Admitting: Family

## 2022-06-28 ENCOUNTER — Other Ambulatory Visit
Admission: RE | Admit: 2022-06-28 | Discharge: 2022-06-28 | Disposition: A | Discharge status: Home / Self Care | Payer: Medicare Other | Source: Ambulatory Visit | Attending: Family | Admitting: Family

## 2022-06-28 ENCOUNTER — Encounter: Payer: Self-pay | Admitting: Family

## 2022-06-28 VITALS — BP 124/72 | HR 84 | Resp 16 | Ht 63.0 in | Wt 113.0 lb

## 2022-06-28 DIAGNOSIS — J449 Chronic obstructive pulmonary disease, unspecified: Secondary | ICD-10-CM | POA: Insufficient documentation

## 2022-06-28 DIAGNOSIS — I5022 Chronic systolic (congestive) heart failure: Secondary | ICD-10-CM | POA: Insufficient documentation

## 2022-06-28 DIAGNOSIS — Z87891 Personal history of nicotine dependence: Secondary | ICD-10-CM | POA: Insufficient documentation

## 2022-06-28 DIAGNOSIS — Z720 Tobacco use: Secondary | ICD-10-CM | POA: Diagnosis not present

## 2022-06-28 DIAGNOSIS — Z716 Tobacco abuse counseling: Secondary | ICD-10-CM | POA: Insufficient documentation

## 2022-06-28 DIAGNOSIS — D649 Anemia, unspecified: Secondary | ICD-10-CM

## 2022-06-28 LAB — BASIC METABOLIC PANEL
Anion gap: 10 (ref 5–15)
BUN: 30 mg/dL — ABNORMAL HIGH (ref 8–23)
CO2: 27 mmol/L (ref 22–32)
Calcium: 9.2 mg/dL (ref 8.9–10.3)
Chloride: 105 mmol/L (ref 98–111)
Creatinine, Ser: 1.14 mg/dL — ABNORMAL HIGH (ref 0.44–1.00)
GFR, Estimated: 51 mL/min — ABNORMAL LOW (ref >60)
Glucose, Bld: 92 mg/dL (ref 70–99)
Potassium: 3.4 mmol/L — ABNORMAL LOW (ref 3.5–5.1)
Sodium: 142 mmol/L (ref 135–145)

## 2022-06-28 MED ORDER — EMPAGLIFLOZIN 10 MG PO TABS: 10.0000 mg | ORAL_TABLET | Freq: Every day | ORAL | 5 refills | Status: DC

## 2022-06-28 MED ORDER — ATORVASTATIN CALCIUM 40 MG PO TABS: 40.0000 mg | ORAL_TABLET | Freq: Every day | ORAL | 5 refills | Status: DC

## 2022-06-28 NOTE — Patient Instructions (Addendum)
Continue weighing daily and call for an overnight weight gain of 3 pounds or more or a weekly weight gain of more than 5 pounds.   If you have voicemail, please make sure your mailbox is cleaned out so that we may leave a message and please make sure to listen to any voicemails.    Start taking jardiance as 1 tablet every morning. When you start taking this medication, you will only take your furosemide if you need it for above weight gain, shortness of breath or swelling.

## 2022-06-28 NOTE — Progress Notes (Signed)
Patient ID: Denise Allen, female    DOB: [birth year], 71 y.o.   MRN: [id number]  HPI  Denise Allen is a 71 y/o female with a history of COPD, thyroid disease, tobacco use and chronic heart failure.   Echo report from 06/12/22 showed EF of 40-45% along with mild/ moderate LVH and mild/ moderate MR/TR.   Admitted 06/12/22 due to COPD/ HF exacerbation.   She presents today for her initial visit with a chief complaint of minimal fatigue upon moderate exertion. Describes this as chronic in nature. She has associated cough along with this. She denies any dizziness, abdominal distention, difficulty sleeping, palpitations, pedal edema, chest pain, wheezing, shortness of breath or weight gain.   No longer smoking and is currently wearing a nicotine patch.   Weighing daily. Using "very little" salt and says that she's cut down substantially.   Past Medical History:  Diagnosis Date   Anemia    CHF (congestive heart failure) (HCC)    COPD (chronic obstructive pulmonary disease) (HCC)    Dupuytren contracture    Hypothyroidism    Past Surgical History:  Procedure Laterality Date   BREAST EXCISIONAL BIOPSY Left 90s   benign   COLONOSCOPY  07/02/2007   COLONOSCOPY N/A 05/02/2021   Procedure: COLONOSCOPY;  Surgeon: Toledo, Boykin Nearing, MD;  Location: [facility] ENDOSCOPY;  Service: Gastroenterology;  Laterality: N/A;   MASTECTOMY PARTIAL / LUMPECTOMY Left    benign   TUBAL LIGATION  1976   Family History  Problem Relation Age of Onset   Cervical cancer Mother    Thyroid disease Mother    Prostate cancer Father    Cerebral aneurysm Sister    Social History   Tobacco Use   Smoking status: Former    Packs/day: 1.00    Years: 53.00    Total pack years: 53.00    Types: Cigarettes   Smokeless tobacco: Never  Substance Use Topics   Alcohol use: Yes    Comment: social   Allergies  Allergen Reactions   Penicillins Other (See Comments)    Patient states "NOT ALLERGIC" causes severe yeast infection.  Prefers NOT to use it.   Prior to Admission medications   Medication Sig Start Date End Date Taking? Authorizing Provider  albuterol (PROVENTIL) (2.5 MG/3ML) 0.083% nebulizer solution Take 3 mLs (2.5 mg total) by nebulization every 4 (four) hours as needed for wheezing or shortness of breath. 06/15/22  Yes Dorcas Carrow, MD  albuterol (VENTOLIN HFA) 108 (90 Base) MCG/ACT inhaler Inhale 2 puffs into the lungs every 4 (four) hours as needed for wheezing or shortness of breath. 06/11/22  Yes [provider]  aspirin EC 81 MG tablet Take 1 tablet (81 mg total) by mouth daily. Swallow whole. 06/16/22  Yes Dorcas Carrow, MD  atorvastatin (LIPITOR) 40 MG tablet Take 1 tablet (40 mg total) by mouth daily. 06/16/22 07/16/22 Yes Ghimire, Lyndel Safe, MD  budesonide-formoterol (SYMBICORT) 80-4.5 MCG/ACT inhaler Inhale 2 puffs into the lungs 2 (two) times daily.   Yes [provider]  cyanocobalamin (VITAMIN B12) 1000 MCG tablet Take 1 tablet (1,000 mcg total) by mouth daily. 06/15/22 07/15/22 Yes Ghimire, Lyndel Safe, MD  dextromethorphan-guaiFENesin [facility] DM) 30-600 MG 12hr tablet Take 1 tablet by mouth 2 (two) times daily as needed for up to 14 days for cough. 06/15/22 06/29/22 Yes Dorcas Carrow, MD  furosemide (LASIX) 40 MG tablet Take 1 tablet (40 mg total) by mouth daily. 06/16/22 07/16/22 Yes Dorcas Carrow, MD  ibuprofen (ADVIL) 600 MG tablet  Take 1,200 mg by mouth daily.   Yes [provider]  levothyroxine (SYNTHROID) 75 MCG tablet Take 88 mcg by mouth daily before breakfast.   Yes [provider]  nicotine (NICODERM CQ - DOSED IN MG/24 HOURS) 21 mg/24hr patch Place 1 patch (21 mg total) onto the skin daily. 06/16/22  Yes Dorcas Carrow, MD   Review of Systems  Constitutional:  Positive for fatigue. Negative for appetite change.  HENT:  Negative for congestion, postnasal drip and sneezing.   Eyes: Negative.   Respiratory:  Positive for cough. Negative for chest tightness,  shortness of breath and wheezing.   Cardiovascular:  Negative for chest pain, palpitations and leg swelling.  Gastrointestinal:  Negative for abdominal distention and abdominal pain.  Endocrine: Negative.   Genitourinary: Negative.   Musculoskeletal:  Negative for back pain and neck pain.  Skin: Negative.   Allergic/Immunologic: Negative.   Neurological:  Negative for dizziness and light-headedness.  Hematological:  Negative for adenopathy. Does not bruise/bleed easily.  Psychiatric/Behavioral:  Negative for dysphoric mood and sleep disturbance (sleeping on 1 pillow). The patient is not nervous/anxious.    Vitals:   06/28/22 1236  BP: 124/72  Pulse: 84  Resp: 16  SpO2: 100%  Weight: 113 lb (51.3 kg)  Height: 5\' 3"  (1.6 m)   Wt Readings from Last 3 Encounters:  06/28/22 113 lb (51.3 kg)  06/15/22 119 lb 6.4 oz (54.2 kg)  09/28/21 115 lb (52.2 kg)   Lab Results  Component Value Date   CREATININE 0.85 06/15/2022   CREATININE 0.85 06/14/2022   CREATININE 1.20 (H) 06/13/2022   Physical Exam Vitals and nursing note reviewed. Exam conducted with a chaperone present (husband).  Constitutional:      Appearance: Normal appearance.  HENT:     Head: Normocephalic and atraumatic.  Cardiovascular:     Rate and Rhythm: Normal rate and regular rhythm.  Pulmonary:     Effort: Pulmonary effort is normal.     Breath sounds: No wheezing, rhonchi or rales.  Abdominal:     General: There is no distension.     Palpations: Abdomen is soft.     Tenderness: There is no abdominal tenderness.  Musculoskeletal:        General: No tenderness.     Cervical back: Normal range of motion and neck supple.     Right lower leg: No edema.     Left lower leg: No edema.  Skin:    General: Skin is warm and dry.  Neurological:     Mental Status: She is alert and oriented to person, place, and time.  Psychiatric:        Mood and Affect: Mood normal.        Behavior: Behavior normal.        Thought  Content: Thought content normal.    Assessment & Plan:  1: Chronic heart failure with reduced ejection fraction- - NYHA class II - euvolemic today - weighing daily and understands to call for an overnight weight gain of > 2 pounds or a weekly weight gain of > 5 pounds - adding "very little" salt and says that she's cut back a lot and is working on not adding any salt - drinking 3-4 glass of water and 2 cups of coffee and 1 wine cooler daily - add jardiance 10mg  daily and reduce furosemide to just PRN for above weight gain, SOB or swelling; jardiance 14 day voucher and 2 weeks samples provided - will check BMP  next visit - briefly discussed other GDMT; plan to start entresto at next visit if able - will also discuss making referral to [facility] provider - BNP 06/12/22 was 1015.7  2: COPD- - saw PCP (Tumey) 06/19/22 - BMP 06/15/22 showed sodium 138, potassium 4.8, creatinine 0.85 & GFR >60 - check BMP today  3: Tobacco use- - had been smoking since she was 71 y/o - hasn't smoked since admission and is now wearing nicotine patch - continued cessation encouraged  4: Anemia- - hemoglobin 06/15/22 was 10.0   Medication bottles reviewed.   Return in 2 weeks, sooner if needed.

## 2022-07-19 ENCOUNTER — Encounter: Payer: Self-pay | Admitting: Family

## 2022-07-19 ENCOUNTER — Encounter: Payer: Self-pay | Admitting: Pharmacy Technician

## 2022-07-19 ENCOUNTER — Other Ambulatory Visit (HOSPITAL_COMMUNITY): Payer: Self-pay

## 2022-07-19 ENCOUNTER — Other Ambulatory Visit
Admission: RE | Admit: 2022-07-19 | Discharge: 2022-07-19 | Disposition: A | Discharge status: Home / Self Care | Payer: Medicare Other | Source: Ambulatory Visit | Attending: Family | Admitting: Family

## 2022-07-19 ENCOUNTER — Ambulatory Visit (HOSPITAL_BASED_OUTPATIENT_CLINIC_OR_DEPARTMENT_OTHER): Payer: Medicare Other | Admitting: Family

## 2022-07-19 ENCOUNTER — Telehealth: Payer: Self-pay | Admitting: Family

## 2022-07-19 ENCOUNTER — Other Ambulatory Visit: Payer: Self-pay | Admitting: Family

## 2022-07-19 VITALS — BP 141/76 | HR 89 | Resp 16 | Wt 115.0 lb

## 2022-07-19 DIAGNOSIS — I5022 Chronic systolic (congestive) heart failure: Secondary | ICD-10-CM | POA: Diagnosis present

## 2022-07-19 DIAGNOSIS — Z720 Tobacco use: Secondary | ICD-10-CM

## 2022-07-19 DIAGNOSIS — D649 Anemia, unspecified: Secondary | ICD-10-CM | POA: Insufficient documentation

## 2022-07-19 DIAGNOSIS — Z716 Tobacco abuse counseling: Secondary | ICD-10-CM | POA: Insufficient documentation

## 2022-07-19 DIAGNOSIS — Z7984 Long term (current) use of oral hypoglycemic drugs: Secondary | ICD-10-CM | POA: Insufficient documentation

## 2022-07-19 DIAGNOSIS — F17210 Nicotine dependence, cigarettes, uncomplicated: Secondary | ICD-10-CM | POA: Insufficient documentation

## 2022-07-19 DIAGNOSIS — J449 Chronic obstructive pulmonary disease, unspecified: Secondary | ICD-10-CM | POA: Diagnosis not present

## 2022-07-19 LAB — BASIC METABOLIC PANEL
Anion gap: 12 (ref 5–15)
BUN: 21 mg/dL (ref 8–23)
CO2: 23 mmol/L (ref 22–32)
Calcium: 9.1 mg/dL (ref 8.9–10.3)
Chloride: 105 mmol/L (ref 98–111)
Creatinine, Ser: 0.6 mg/dL (ref 0.44–1.00)
GFR, Estimated: >60 mL/min (ref >60)
Glucose, Bld: 86 mg/dL (ref 70–99)
Potassium: 3.9 mmol/L (ref 3.5–5.1)
Sodium: 140 mmol/L (ref 135–145)

## 2022-07-19 MED ORDER — BUDESONIDE-FORMOTEROL FUMARATE 80-4.5 MCG/ACT IN AERO: 2.0000 | INHALATION_SPRAY | Freq: Two times a day (BID) | RESPIRATORY_TRACT | 3 refills | Status: DC

## 2022-07-19 MED ORDER — VITAMIN B-12 1000 MCG PO TABS: 1000.0000 ug | ORAL_TABLET | Freq: Every day | ORAL | 5 refills | Status: AC

## 2022-07-19 MED ORDER — ATORVASTATIN CALCIUM 40 MG PO TABS: 40.0000 mg | ORAL_TABLET | Freq: Every day | ORAL | 5 refills | Status: DC

## 2022-07-19 MED ORDER — EMPAGLIFLOZIN 10 MG PO TABS: 10.0000 mg | ORAL_TABLET | Freq: Every day | ORAL | 3 refills | Status: DC

## 2022-07-19 MED ORDER — EMPAGLIFLOZIN 10 MG PO TABS: 10.0000 mg | ORAL_TABLET | Freq: Every day | ORAL | 5 refills | Status: DC

## 2022-07-19 MED ORDER — SACUBITRIL-VALSARTAN 24-26 MG PO TABS: 1.0000 | ORAL_TABLET | Freq: Two times a day (BID) | ORAL | 3 refills | Status: DC

## 2022-07-19 NOTE — Progress Notes (Signed)
[facility] - HEART FAILURE CLINIC - PHARMACIST COUNSELING NOTE  Guideline-Directed Medical Therapy/Evidence Based Medicine  ACE/ARB/ARNI:  None Beta Blocker:  None Aldosterone Antagonist:  None Diuretic: Furosemide 40 mg daily SGLT2i: Empagliflozin 10 mg daily  Adherence Assessment  Do you ever forget to take your medication? [] Yes [x] No  Do you ever skip doses due to side effects? [] Yes [x] No  Do you have trouble affording your medicines? [x] Yes [] No  Are you ever unable to pick up your medication due to transportation difficulties? [] Yes [x] No  Do you ever stop taking your medications because you don't believe they are helping? [] Yes [x] No  Do you check your weight daily? [] Yes [x] No   Adherence strategy: Does not use a pill box  Barriers to obtaining medications: $47 copay for is unaffordable. Patient is new to Jardiance and would anticipate her entering the donut hole (she takes Symbicort, as well) quickly unless she is approved for PAPs. We will give her forms to fill out.  Vital signs: HR 89, BP 141/76, weight (pounds) 115 ECHO: Date 05/2022, EF 40-45%     Latest Ref Rng & Units 06/28/2022    1:39 PM 06/15/2022    4:32 AM 06/14/2022    6:15 AM  BMP  Glucose 70 - 99 mg/dL 92     BUN 8 - 23 mg/dL 30  36  38   Creatinine 0.44 - 1.00 mg/dL     Sodium 135 - 145 mmol/L 142  138  135   Potassium 3.5 - 5.1 mmol/L 3.4  4.8  5.2   Chloride 98 - 111 mmol/L 105  107  109   CO2 22 - 32 mmol/L 27  24  22    Calcium 8.9 - 10.3 mg/dL 9.2  8.8  8.7     Past Medical History:  Diagnosis Date   Anemia    CHF (congestive heart failure) (HCC)    COPD (chronic obstructive pulmonary disease) (HCC)    Dupuytren contracture    Hypothyroidism     ASSESSMENT 72 year old female with PMH COPD, ACS (demand ischemia in 05/2022) who presents to the HF clinic for follow-up. Most recent ECHO in 05/2022 shows EF 40-45%. Regarding GDMT,  patient takes Jardiance 10 mg daily. Patient also takes furosemide 40 mg only as needed for fluid. Patient has started smoking again. I spoke with patient about her inhalers, specifically that Symbicort is intended to be her maintenance inhaler with albuterol reserved for rescue. Patient had been using them the other way around. In recognition that increased Symbicort usage may expedite entering the donut hole, will provider patient PAP form for Symbicort. Without PAP approval, Entresto copay would be $47 a month which is unaffordable for patient and would put her into the donut hole sooner.  Recent ED Visit (past 6 months): Date - 06/12/2022, [facility] - COPD exacerbation  PLAN CHF/HTN Recommend initiation of Entresto 49/51 mg twice daily if PAP application is approved Continue Jardiance and Lasix Provided patient with PAP forms for 06/30/2022, and Symbicort If patient is ever initiated on a beta blocker, would recommend a cardioselective one like Toprol XL or bisoprolol in the setting of COPD   Time spent: 15 minutes  Will M. 06/16/2022, PharmD PGY-1 Pharmacy Resident 07/19/2022 12:24 PM    Current Outpatient Medications:    albuterol (PROVENTIL) (2.5 MG/3ML) 0.083% nebulizer solution, Take 3 mLs (2.5 mg total) by nebulization every 4 (four) hours as needed for wheezing or  shortness of breath., Disp: 75 mL, Rfl: 12   albuterol (VENTOLIN HFA) 108 (90 Base) MCG/ACT inhaler, Inhale 2 puffs into the lungs every 4 (four) hours as needed for wheezing or shortness of breath., Disp: , Rfl:    aspirin EC 81 MG tablet, Take 1 tablet (81 mg total) by mouth daily. Swallow whole., Disp: 30 tablet, Rfl: 12   atorvastatin (LIPITOR) 40 MG tablet, Take 1 tablet (40 mg total) by mouth daily., Disp: 30 tablet, Rfl: 5   budesonide-formoterol (SYMBICORT) 80-4.5 MCG/ACT inhaler, Inhale 2 puffs into the lungs 2 (two) times daily., Disp: , Rfl:    cyanocobalamin (VITAMIN B12) 1000 MCG tablet, Take 1 tablet (1,000  mcg total) by mouth daily., Disp: 30 tablet, Rfl: 5   empagliflozin (JARDIANCE) 10 MG TABS tablet, Take 1 tablet (10 mg total) by mouth daily before breakfast., Disp: 30 tablet, Rfl: 5   furosemide (LASIX) 40 MG tablet, Take 1 tablet (40 mg total) by mouth daily. (Patient taking differently: Take 40 mg by mouth as needed for fluid.), Disp: 30 tablet, Rfl: 0   ibuprofen (ADVIL) 600 MG tablet, Take 600 mg by mouth daily., Disp: , Rfl:    levothyroxine (SYNTHROID) 75 MCG tablet, Take 75 mcg by mouth daily before breakfast., Disp: , Rfl:    nicotine (NICODERM CQ - DOSED IN MG/24 HOURS) 21 mg/24hr patch, Place 1 patch (21 mg total) onto the skin daily. (Patient not taking: Reported on 07/19/2022), Disp: 28 patch, Rfl: 0   DRUGS TO CAUTION IN HEART FAILURE  Drug or Class Mechanism  Analgesics NSAIDs COX-2 inhibitors Glucocorticoids  Sodium and water retention, increased systemic vascular resistance, decreased response to diuretics   Diabetes Medications Metformin Thiazolidinediones Rosiglitazone (Avandia) Pioglitazone (Actos) DPP4 Inhibitors Saxagliptin (Onglyza) Sitagliptin (Januvia)   Lactic acidosis Possible calcium channel blockade   Unknown  Antiarrhythmics Class I  Flecainide Disopyramide Class III Sotalol Other Dronedarone  Negative inotrope, proarrhythmic   Proarrhythmic, beta blockade  Negative inotrope  Antihypertensives Alpha Blockers Doxazosin Calcium Channel Blockers Diltiazem Verapamil Nifedipine Central Alpha Adrenergics Moxonidine Peripheral Vasodilators Minoxidil  Increases renin and aldosterone  Negative inotrope    Possible sympathetic withdrawal  Unknown  Anti-infective Itraconazole Amphotericin B  Negative inotrope Unknown  Hematologic Anagrelide Cilostazol   Possible inhibition of PD IV Inhibition of PD III causing arrhythmias  Neurologic/Psychiatric Stimulants Anti-Seizure  Drugs Carbamazepine Pregabalin Antidepressants Tricyclics Citalopram Parkinsons Bromocriptine Pergolide Pramipexole Antipsychotics Clozapine Antimigraine Ergotamine Methysergide Appetite suppressants Bipolar Lithium  Peripheral alpha and beta agonist activity  Negative inotrope and chronotrope Calcium channel blockade  Negative inotrope, proarrhythmic Dose-dependent QT prolongation  Excessive serotonin activity/valvular damage Excessive serotonin activity/valvular damage Unknown  IgE mediated hypersensitivy, calcium channel blockade  Excessive serotonin activity/valvular damage Excessive serotonin activity/valvular damage Valvular damage  Direct myofibrillar degeneration, adrenergic stimulation  Antimalarials Chloroquine Hydroxychloroquine Intracellular inhibition of lysosomal enzymes  Urologic Agents Alpha Blockers Doxazosin Prazosin Tamsulosin Terazosin  Increased renin and aldosterone  Adapted from Page Carleene Overlie, et al. "Drugs That May Cause or Exacerbate Heart Failure: A Scientific Statement from the American Heart  Association." Circulation 2016; 134:e32-e69. DOI: 10.1161/CIR.0000000000000426   MEDICATION ADHERENCES TIPS AND STRATEGIES Taking medication as prescribed improves patient outcomes in heart failure (reduces hospitalizations, improves symptoms, increases survival) Side effects of medications can be managed by decreasing doses, switching agents, stopping drugs, or adding additional therapy. Please let someone in the [facility] know if you have having bothersome side effects so we can modify your regimen. Do not alter your medication regimen without  talking to [place].  Medication reminders can help patients remember to take drugs on time. If you are missing or forgetting doses you can try linking behaviors, using pill boxes, or an electronic reminder like an alarm on your phone or an app. Some people can also get automated phone calls as medication  reminders.

## 2022-07-19 NOTE — Patient Instructions (Addendum)
Resume weighing daily and call for an overnight weight gain of 3 pounds or more or a weekly weight gain of more than 5 pounds.   If you have voicemail, please make sure your mailbox is cleaned out so that we may leave a message and please make sure to listen to any voicemails.     

## 2022-07-19 NOTE — Progress Notes (Signed)
Patient ID: Denise Allen, female    DOB: 28-Sep-1950, 72 y.o.   MRN: [id number]  HPI  Denise Allen is a 72 y/o female with a history of COPD, thyroid disease, tobacco use and chronic heart failure.   Echo report from 06/12/22 showed EF of 40-45% along with mild/ moderate LVH and mild/ moderate MR/TR.   Admitted 06/12/22 due to COPD/ HF exacerbation.   She presents today for a follow-up visit with a chief complaint of minimal fatigue with moderate exertion. Describes this as chronic in nature. Has associated cough along with this. Denies any difficulty sleeping, dizziness, abdominal distention, palpitations, pedal edema, chest pain, wheezing, shortness of breath or weight gain.   Has tolerated jardiance without known side effect.   Past Medical History:  Diagnosis Date   Anemia    CHF (congestive heart failure) (HCC)    COPD (chronic obstructive pulmonary disease) ([place])    Dupuytren contracture    Hypothyroidism    Past Surgical History:  Procedure Laterality Date   BREAST EXCISIONAL BIOPSY Left 90s   benign   COLONOSCOPY  07/02/2007   COLONOSCOPY N/A 05/02/2021   Procedure: COLONOSCOPY;  Surgeon: Toledo, Benay Pike, MD;  Location: [facility] ENDOSCOPY;  Service: Gastroenterology;  Laterality: N/A;   MASTECTOMY PARTIAL / LUMPECTOMY Left    benign   TUBAL LIGATION  1976   Family History  Problem Relation Age of Onset   Cervical cancer Mother    Thyroid disease Mother    Prostate cancer Father    Cerebral aneurysm Sister    Social History   Tobacco Use   Smoking status: Former    Packs/day: 1.00    Years: 53.00    Total pack years: 53.00    Types: Cigarettes   Smokeless tobacco: Never  Substance Use Topics   Alcohol use: Yes    Comment: social   Allergies  Allergen Reactions   Penicillins Other (See Comments)    Patient states "NOT ALLERGIC" causes severe yeast infection. Prefers NOT to use it.   Prior to Admission medications   Medication Sig Start Date End Date Taking?  Authorizing Provider  albuterol (PROVENTIL) (2.5 MG/3ML) 0.083% nebulizer solution Take 3 mLs (2.5 mg total) by nebulization every 4 (four) hours as needed for wheezing or shortness of breath. 06/15/22  Yes Barb Merino, MD  albuterol (VENTOLIN HFA) 108 (90 Base) MCG/ACT inhaler Inhale 2 puffs into the lungs every 4 (four) hours as needed for wheezing or shortness of breath. 06/11/22  Yes [provider]  aspirin EC 81 MG tablet Take 1 tablet (81 mg total) by mouth daily. Swallow whole. 06/16/22  Yes Barb Merino, MD  budesonide-formoterol (SYMBICORT) 80-4.5 MCG/ACT inhaler Inhale 2 puffs into the lungs 2 (two) times daily.   Yes [provider]  cyanocobalamin (VITAMIN B12) 1000 MCG tablet Take 1 tablet (1,000 mcg total) by mouth daily. 07/19/22  Yes Darylene Price A, FNP  furosemide (LASIX) 40 MG tablet Take 1 tablet (40 mg total) by mouth daily. Patient taking differently: Take 40 mg by mouth as needed for fluid. 06/16/22 07/19/22 Yes Barb Merino, MD  ibuprofen (ADVIL) 600 MG tablet Take 600 mg by mouth daily.   Yes [provider]  levothyroxine (SYNTHROID) 75 MCG tablet Take 75 mcg by mouth daily before breakfast.   Yes [provider]  atorvastatin (LIPITOR) 40 MG tablet Take 1 tablet (40 mg total) by mouth daily. 07/19/22   Alisa Graff, FNP  empagliflozin (JARDIANCE) 10 MG TABS  tablet Take 1 tablet (10 mg total) by mouth daily before breakfast. 07/19/22   Delma Freeze, FNP  nicotine (NICODERM CQ - DOSED IN MG/24 HOURS) 21 mg/24hr patch Place 1 patch (21 mg total) onto the skin daily. Patient not taking: Reported on 07/19/2022 06/16/22   Dorcas Carrow, MD    Review of Systems  Constitutional:  Positive for fatigue. Negative for appetite change.  HENT:  Negative for congestion, postnasal drip and sneezing.   Eyes: Negative.   Respiratory:  Positive for cough. Negative for chest tightness, shortness of breath and wheezing.   Cardiovascular:   Negative for chest pain, palpitations and leg swelling.  Gastrointestinal:  Negative for abdominal distention and abdominal pain.  Endocrine: Negative.   Genitourinary: Negative.   Musculoskeletal:  Negative for back pain and neck pain.  Skin: Negative.   Allergic/Immunologic: Negative.   Neurological:  Negative for dizziness and light-headedness.  Hematological:  Negative for adenopathy. Does not bruise/bleed easily.  Psychiatric/Behavioral:  Negative for dysphoric mood and sleep disturbance (sleeping on 1 pillow). The patient is not nervous/anxious.    Vitals:   07/19/22 1020  BP: (!) 141/76  Pulse: 89  Resp: 16  Weight: 115 lb (52.2 kg)   Wt Readings from Last 3 Encounters:  07/19/22 115 lb (52.2 kg)  06/28/22 113 lb (51.3 kg)  06/15/22 119 lb 6.4 oz (54.2 kg)   Lab Results  Component Value Date   CREATININE 1.14 (H) 06/28/2022   CREATININE 0.85 06/15/2022   CREATININE 0.85 06/14/2022   Physical Exam Vitals and nursing note reviewed. Exam conducted with a chaperone present (husband).  Constitutional:      Appearance: Normal appearance.  HENT:     Head: Normocephalic and atraumatic.  Cardiovascular:     Rate and Rhythm: Normal rate and regular rhythm.  Pulmonary:     Effort: Pulmonary effort is normal.     Breath sounds: No wheezing, rhonchi or rales.  Abdominal:     General: There is no distension.     Palpations: Abdomen is soft.     Tenderness: There is no abdominal tenderness.  Musculoskeletal:        General: No tenderness.     Cervical back: Normal range of motion and neck supple.     Right lower leg: No edema.     Left lower leg: No edema.  Skin:    General: Skin is warm and dry.  Neurological:     Mental Status: She is alert and oriented to person, place, and time.  Psychiatric:        Mood and Affect: Mood normal.        Behavior: Behavior normal.        Thought Content: Thought content normal.    Assessment & Plan:  1: Chronic heart failure  with reduced ejection fraction- - NYHA class II - euvolemic today - weighing every other day; encouraged to resume daily weighing so that she can call for an overnight weight gain of > 2 pounds or a weekly weight gain of > 5 pounds - weight up 2 pounds from last visit here 3 weeks ago - adding "very little" salt and says that she's cut back a lot and is working on not adding any salt - drinking 3-4 glass of water and 2 cups of coffee and 1 wine cooler daily - GDMT of jardiance - BMP today - patient says that she can't afford the $47 entresto copay; patient assistance forms for this, jardiance and  symbicort filled out today - BNP 06/12/22 was 1015.7  2: COPD- - saw PCP Vidal Schwalbe) 06/19/22 - hasn't been using symbicort consistently so instructed to use this daily and albuterol PRN - BMP 06/15/22 showed sodium 138, potassium 4.8, creatinine 0.85 & GFR >60  3: Tobacco use- - had been smoking since she was 72 y/o - resumed smoking 1 ppd - cessation discussed  4: Anemia- - hemoglobin 06/15/22 was 10.0   Medication list reviewed.   Return in 1 month, sooner if needed.

## 2022-07-19 NOTE — Progress Notes (Signed)
RX for jardiance, symbicort and entresto printed for patient assistance applications

## 2022-07-19 NOTE — Telephone Encounter (Signed)
Faxed patient assistance for symbicort inhailer with [employer] and jardiance application. Waiting on proof of income to send novartis application.   Denise Allen, NT

## 2022-07-22 ENCOUNTER — Other Ambulatory Visit: Payer: Self-pay | Admitting: Family

## 2022-07-22 ENCOUNTER — Telehealth: Payer: Self-pay | Admitting: Family

## 2022-07-22 ENCOUNTER — Telehealth: Payer: Self-pay | Admitting: *Deleted

## 2022-07-22 MED ORDER — BUDESONIDE-FORMOTEROL FUMARATE 80-4.5 MCG/ACT IN AERO: 2.0000 | INHALATION_SPRAY | Freq: Two times a day (BID) | RESPIRATORY_TRACT | 3 refills | Status: AC

## 2022-07-22 NOTE — Telephone Encounter (Signed)
Patient called to say that her pharmacy said that they didn't have a RX for B12 on file. Advised patient that there was confirmation from her pharmacy on 07/19/22 that they received the RX (per EPIC). I also refilled her symbicort inhaler today for her.   She was appreciative of this and will f/u with [facility].

## 2022-07-22 NOTE — Telephone Encounter (Signed)
Pt called asking for a return call from provider about medications and possible refil of inhaler.

## 2022-07-23 ENCOUNTER — Telehealth: Payer: Self-pay | Admitting: Family

## 2022-07-23 NOTE — Telephone Encounter (Signed)
[employer] application was faxed 8/65 for [employer], NT

## 2022-07-24 NOTE — Telephone Encounter (Signed)
Patient was approved for [employer]vart[employer] patient assistance for Entresto until 07/01/23   Denise Allen, NT 

## 2022-07-29 ENCOUNTER — Telehealth: Payer: Self-pay | Admitting: Family

## 2022-07-29 NOTE — Telephone Encounter (Signed)
Patient was approved patient assistance for Jardiance until 07/01/23 and gave patient instructions on how to get medication shipped.    Denise Allen, NT

## 2022-08-02 ENCOUNTER — Telehealth: Payer: Self-pay | Admitting: Pharmacy Technician

## 2022-08-02 NOTE — Telephone Encounter (Signed)
08/02/2022 Name:  Denise Allen MRN:  [id number] DOB:  05-21-1951  Summary: Phoned patient regarding a discussion we had had about her Symbicort inhaler, as patients takes several brand name medications that risk putting her into the donut hole, and she has run into this problem before. She had been using more of her albuterol instead of the Symbicort, to avoid having to fill the Symbicort as often. Patient informed me that luckily the $150 copay for her Symbicort was for 3 months of medicine, instead of just 1 month which we feared. Now that she receives patient assistance for [facility] and [facility], this should buy her a significant amount of time before she falls into the donut hole.  Symbicort used to be part of the [employer] program but no longer is, and instead it has been replaced with Breztri. Informed patient about the Judithann Sauger option and advised her to discuss with her PCP if he thinks Judithann Sauger Aerosphere would be an appropriate option for her, given that she would be able to obtain it through [employer].  Recommendations/Changes made from today's visit: Informed patient about the Breztri option and advised her to discuss with her PCP if he thinks Judithann Sauger Aerosphere would be an appropriate option for her, given that she would be able to obtain it through [employer].

## 2022-08-17 NOTE — Progress Notes (Signed)
Patient ID: Denise Allen, female    DOB: 12/23/1950, 72 y.o.   MRN: [id number]  HPI  Denise Allen is a 72 y/o female with a history of COPD, thyroid disease, tobacco use and chronic heart failure.   Echo report from 06/12/22 showed EF of 40-45% along with mild/ moderate LVH and mild/ moderate MR/TR.   Admitted 06/12/22 due to COPD/ HF exacerbation.   She presents today for a HF follow-up visit with a chief complaint of minimal fatigue with moderate exertion. Describes this as chronic in nature. Has associated cough & SOB along with this. Denies any dizziness, difficulty sleeping, abdominal distention, palpitations, pedal edema, chest pain, wheezing or weight gain.   Past Medical History:  Diagnosis Date   Anemia    CHF (congestive heart failure) (HCC)    COPD (chronic obstructive pulmonary disease) ([place])    Dupuytren contracture    Hypothyroidism    Past Surgical History:  Procedure Laterality Date   BREAST EXCISIONAL BIOPSY Left 90s   benign   COLONOSCOPY  07/02/2007   COLONOSCOPY N/A 05/02/2021   Procedure: COLONOSCOPY;  Surgeon: Denise Allen;  Location: [facility] ENDOSCOPY;  Service: Gastroenterology;  Laterality: N/A;   MASTECTOMY PARTIAL / LUMPECTOMY Left    benign   TUBAL LIGATION  1976   Family History  Problem Relation Age of Onset   Cervical cancer Mother    Thyroid disease Mother    Prostate cancer Father    Cerebral aneurysm Sister    Social History   Tobacco Use   Smoking status: Former    Packs/day: 1.00    Years: 53.00    Total pack years: 53.00    Types: Cigarettes   Smokeless tobacco: Never  Substance Use Topics   Alcohol use: Yes    Comment: social   Allergies  Allergen Reactions   Penicillins Other (See Comments)    Patient states "NOT ALLERGIC" causes severe yeast infection. Prefers NOT to use it.   Prior to Admission medications   Medication Sig Start Date End Date Taking? Authorizing Provider  albuterol (PROVENTIL) (2.5 MG/3ML) 0.083%  nebulizer solution Take 3 mLs (2.5 mg total) by nebulization every 4 (four) hours as needed for wheezing or shortness of breath. 06/15/22  Yes Denise Allen  albuterol (VENTOLIN HFA) 108 (90 Base) MCG/ACT inhaler Inhale 2 puffs into the lungs every 4 (four) hours as needed for wheezing or shortness of breath. 06/11/22  Yes Denise Allen  aspirin EC 81 MG tablet Take 1 tablet (81 mg total) by mouth daily. Swallow whole. 06/16/22  Yes Denise Allen  atorvastatin (LIPITOR) 40 MG tablet Take 1 tablet (40 mg total) by mouth daily. 07/19/22  Yes Denise Allen  budesonide-formoterol (SYMBICORT) 80-4.5 MCG/ACT inhaler Inhale 2 puffs into the lungs 2 (two) times daily. 07/22/22  Yes Denise Allen  cyanocobalamin (VITAMIN B12) 1000 MCG tablet Take 1 tablet (1,000 mcg total) by mouth daily. 07/19/22  Yes Denise Allen  empagliflozin (JARDIANCE) 10 MG TABS tablet Take 1 tablet (10 mg total) by mouth daily before breakfast. 07/19/22  Yes Denise Allen  furosemide (LASIX) 40 MG tablet Take 1 tablet (40 mg total) by mouth daily. Patient taking differently: Take 40 mg by mouth as needed for fluid. 06/16/22  Yes Denise Allen  ibuprofen (ADVIL) 600 MG tablet Take 600 mg by mouth daily.   Yes Denise Allen  levothyroxine (SYNTHROID) 75 MCG tablet Take 75 mcg  by mouth daily before breakfast.   Yes Denise Allen  sacubitril-valsartan (ENTRESTO) 24-26 MG Take 1 tablet by mouth 2 (two) times daily. 07/19/22  Yes Denise Allen   Review of Systems  Constitutional:  Positive for fatigue. Negative for appetite change.  HENT:  Negative for congestion, postnasal drip and sneezing.   Eyes: Negative.   Respiratory:  Positive for cough and shortness of breath. Negative for chest tightness and wheezing.   Cardiovascular:  Negative for chest pain, palpitations and leg swelling.  Gastrointestinal:  Negative for abdominal distention and abdominal pain.   Endocrine: Negative.   Genitourinary: Negative.   Musculoskeletal:  Negative for back pain and neck pain.  Skin: Negative.   Allergic/Immunologic: Negative.   Neurological:  Negative for dizziness and light-headedness.  Hematological:  Negative for adenopathy. Does not bruise/bleed easily.  Psychiatric/Behavioral:  Negative for dysphoric mood and sleep disturbance (sleeping on 1 pillow). The patient is not nervous/anxious.    Vitals:   08/19/22 1107  BP: (!) 149/84  Pulse: 92  Resp: 16  SpO2: 94%  Weight: 116 lb (52.6 kg)   Wt Readings from Last 3 Encounters:  08/19/22 116 lb (52.6 kg)  07/19/22 115 lb (52.2 kg)  06/28/22 113 lb (51.3 kg)   Lab Results  Component Value Date   CREATININE 0.60 07/19/2022   CREATININE 1.14 (H) 06/28/2022   CREATININE 0.85 06/15/2022   Physical Exam Vitals and nursing note reviewed.  Constitutional:      Appearance: Normal appearance.  HENT:     Head: Normocephalic and atraumatic.  Cardiovascular:     Rate and Rhythm: Normal rate and regular rhythm.  Pulmonary:     Effort: Pulmonary effort is normal.     Breath sounds: No wheezing, rhonchi or rales.  Abdominal:     General: There is no distension.     Palpations: Abdomen is soft.     Tenderness: There is no abdominal tenderness.  Musculoskeletal:        General: No tenderness.     Cervical back: Normal range of motion and neck supple.     Right lower leg: No edema.     Left lower leg: No edema.  Skin:    General: Skin is warm and dry.  Neurological:     Mental Status: She is alert and oriented to person, place, and time.  Psychiatric:        Mood and Affect: Mood normal.        Behavior: Behavior normal.        Thought Content: Thought content normal.    Assessment & Plan:  1: Chronic heart failure with reduced ejection fraction- - NYHA class II - euvolemic today - weighing daily; reminded to call for an overnight weight gain of > 2 pounds or a weekly weight gain of > 5  pounds - weight stable from last visit here 1 month ago - adding "very little" salt and says that she's cut back a lot and is working on not adding any salt - drinking 3-4 glass of water and 2 cups of coffee and 1 wine cooler daily - jardiance 43m daily - entresto 24/248mBID - add metoprolol succinate 2576maily - discussed adding spironolactone at next visit - BNP 06/12/22 was 1015.7  2: COPD- - saw PCP (Tumey) 06/19/22 - symbicort daily along with PRN abuterol - BMP 07/19/22 showed sodium 140, potassium 3.9, creatinine 0.6 & GFR >60  3: Tobacco use- - has been smoking since  she was 72 y/o - smoking 1 ppd cigarettes; has patches at home but says that she really doesn't want to quit smoking even though she know she should - cessation discussed  4: Anemia- - hemoglobin 06/15/22 was 10.0   Medication bottles reviewed.  Return in 2 weeks, sooner if needed.

## 2022-08-19 ENCOUNTER — Ambulatory Visit: Payer: Medicare Other | Attending: Family | Admitting: Family

## 2022-08-19 ENCOUNTER — Encounter: Payer: Self-pay | Admitting: Family

## 2022-08-19 VITALS — BP 149/84 | HR 92 | Resp 16 | Wt 116.0 lb

## 2022-08-19 DIAGNOSIS — I5022 Chronic systolic (congestive) heart failure: Secondary | ICD-10-CM | POA: Diagnosis not present

## 2022-08-19 DIAGNOSIS — Z720 Tobacco use: Secondary | ICD-10-CM

## 2022-08-19 DIAGNOSIS — F17210 Nicotine dependence, cigarettes, uncomplicated: Secondary | ICD-10-CM | POA: Insufficient documentation

## 2022-08-19 DIAGNOSIS — J449 Chronic obstructive pulmonary disease, unspecified: Secondary | ICD-10-CM | POA: Diagnosis not present

## 2022-08-19 DIAGNOSIS — Z7984 Long term (current) use of oral hypoglycemic drugs: Secondary | ICD-10-CM | POA: Insufficient documentation

## 2022-08-19 DIAGNOSIS — E039 Hypothyroidism, unspecified: Secondary | ICD-10-CM | POA: Diagnosis not present

## 2022-08-19 DIAGNOSIS — Z79899 Other long term (current) drug therapy: Secondary | ICD-10-CM | POA: Insufficient documentation

## 2022-08-19 DIAGNOSIS — D649 Anemia, unspecified: Secondary | ICD-10-CM | POA: Diagnosis not present

## 2022-08-19 MED ORDER — METOPROLOL SUCCINATE ER 25 MG PO TB24: 25.0000 mg | ORAL_TABLET | Freq: Every day | ORAL | 5 refills | Status: DC

## 2022-08-19 NOTE — Patient Instructions (Addendum)
Continue weighing daily and call for an overnight weight gain of 3 pounds or more or a weekly weight gain of more than 5 pounds.   If you have voicemail, please make sure your mailbox is cleaned out so that we may leave a message and please make sure to listen to any voicemails.    Start taking metoprolol as 1 tablet every day.

## 2022-09-06 ENCOUNTER — Ambulatory Visit (HOSPITAL_BASED_OUTPATIENT_CLINIC_OR_DEPARTMENT_OTHER): Payer: Medicare Other | Admitting: Family

## 2022-09-06 ENCOUNTER — Other Ambulatory Visit
Admission: RE | Admit: 2022-09-06 | Discharge: 2022-09-06 | Disposition: A | Discharge status: Home / Self Care | Payer: Medicare Other | Source: Ambulatory Visit | Attending: Family | Admitting: Family

## 2022-09-06 ENCOUNTER — Encounter: Payer: Self-pay | Admitting: Family

## 2022-09-06 VITALS — BP 137/77 | HR 72 | Wt 116.0 lb

## 2022-09-06 DIAGNOSIS — R059 Cough, unspecified: Secondary | ICD-10-CM | POA: Insufficient documentation

## 2022-09-06 DIAGNOSIS — Z720 Tobacco use: Secondary | ICD-10-CM

## 2022-09-06 DIAGNOSIS — Z7984 Long term (current) use of oral hypoglycemic drugs: Secondary | ICD-10-CM | POA: Insufficient documentation

## 2022-09-06 DIAGNOSIS — D649 Anemia, unspecified: Secondary | ICD-10-CM

## 2022-09-06 DIAGNOSIS — I5022 Chronic systolic (congestive) heart failure: Secondary | ICD-10-CM

## 2022-09-06 DIAGNOSIS — J449 Chronic obstructive pulmonary disease, unspecified: Secondary | ICD-10-CM | POA: Insufficient documentation

## 2022-09-06 DIAGNOSIS — Z79899 Other long term (current) drug therapy: Secondary | ICD-10-CM | POA: Insufficient documentation

## 2022-09-06 DIAGNOSIS — F17210 Nicotine dependence, cigarettes, uncomplicated: Secondary | ICD-10-CM | POA: Insufficient documentation

## 2022-09-06 LAB — BASIC METABOLIC PANEL
Anion gap: 8 (ref 5–15)
BUN: 19 mg/dL (ref 8–23)
CO2: 24 mmol/L (ref 22–32)
Calcium: 8.7 mg/dL — ABNORMAL LOW (ref 8.9–10.3)
Chloride: 107 mmol/L (ref 98–111)
Creatinine, Ser: 0.58 mg/dL (ref 0.44–1.00)
GFR, Estimated: >60 mL/min (ref >60)
Glucose, Bld: 98 mg/dL (ref 70–99)
Potassium: 3.3 mmol/L — ABNORMAL LOW (ref 3.5–5.1)
Sodium: 139 mmol/L (ref 135–145)

## 2022-09-06 MED ORDER — SPIRONOLACTONE 25 MG PO TABS: 25.0000 mg | ORAL_TABLET | Freq: Every day | ORAL | 5 refills | Status: DC

## 2022-09-06 NOTE — Patient Instructions (Addendum)
Begin spironolactone as 1 tablet every morning.    Go to the [facility] today to get your lab work done today.    Next week, you will need to go back to the [facility] to get labs again and you will need to go to the registration desk

## 2022-09-06 NOTE — Progress Notes (Signed)
Patient ID: Denise Allen, female    DOB: September 12, 1950, 72 y.o.   MRN: [id number]  HPI  Denise Allen is a 72 y/o female with a history of COPD, thyroid disease, tobacco use and chronic heart failure.   Echo report from 06/12/22 showed EF of 40-45% along with mild/ moderate LVH and mild/ moderate MR/TR.   Admitted 06/12/22 due to COPD/ HF exacerbation.   She presents today for a HF follow-up visit with a chief complaint of minimal fatigue upon moderate exertion. Describes this as chronic in nature. Has associated cough, SOB, nasal congestion & headaches along with this. Denies and dizziness, difficulty sleeping, abdominal distention, palpitations, pedal edema, chest pain, wheezing or weight gain.   Says that since she's started metoprolol, she has noticed frequent headaches.   Past Medical History:  Diagnosis Date   Anemia    CHF (congestive heart failure) (HCC)    COPD (chronic obstructive pulmonary disease) ([place])    Dupuytren contracture    Hypothyroidism    Past Surgical History:  Procedure Laterality Date   BREAST EXCISIONAL BIOPSY Left 90s   benign   COLONOSCOPY  07/02/2007   COLONOSCOPY N/A 05/02/2021   Procedure: COLONOSCOPY;  Surgeon: Toledo, Benay Pike, MD;  Location: [facility] ENDOSCOPY;  Service: Gastroenterology;  Laterality: N/A;   MASTECTOMY PARTIAL / LUMPECTOMY Left    benign   TUBAL LIGATION  1976   Family History  Problem Relation Age of Onset   Cervical cancer Mother    Thyroid disease Mother    Prostate cancer Father    Cerebral aneurysm Sister    Social History   Tobacco Use   Smoking status: Former    Packs/day: 1.00    Years: 53.00    Total pack years: 53.00    Types: Cigarettes   Smokeless tobacco: Never  Substance Use Topics   Alcohol use: Yes    Comment: social   Allergies  Allergen Reactions   Penicillins Other (See Comments)    Patient states "NOT ALLERGIC" causes severe yeast infection. Prefers NOT to use it.   Prior to Admission medications    Medication Sig Start Date End Date Taking? Authorizing Provider  aspirin EC 81 MG tablet Take 1 tablet (81 mg total) by mouth daily. Swallow whole. 06/16/22  Yes Barb Merino, MD  atorvastatin (LIPITOR) 40 MG tablet Take 1 tablet (40 mg total) by mouth daily. 07/19/22  Yes Lorren Splawn, Otila Kluver A, FNP  budesonide-formoterol (SYMBICORT) 80-4.5 MCG/ACT inhaler Inhale 2 puffs into the lungs 2 (two) times daily. 07/22/22  Yes Darylene Price A, FNP  cyanocobalamin (VITAMIN B12) 1000 MCG tablet Take 1 tablet (1,000 mcg total) by mouth daily. 07/19/22  Yes Darylene Price A, FNP  empagliflozin (JARDIANCE) 10 MG TABS tablet Take 1 tablet (10 mg total) by mouth daily before breakfast. 07/19/22  Yes Darylene Price A, FNP  furosemide (LASIX) 40 MG tablet Take 1 tablet (40 mg total) by mouth daily. Patient taking differently: Take 40 mg by mouth as needed for fluid. 06/16/22  Yes Barb Merino, MD  levothyroxine (SYNTHROID) 75 MCG tablet Take 75 mcg by mouth daily before breakfast.   Yes [provider]  metoprolol succinate (TOPROL XL) 25 MG 24 hr tablet Take 1 tablet (25 mg total) by mouth daily. 08/19/22  Yes Ardice Boyan A, FNP  sacubitril-valsartan (ENTRESTO) 24-26 MG Take 1 tablet by mouth 2 (two) times daily. 07/19/22  Yes Valinda Fedie, Otila Kluver A, FNP  albuterol (PROVENTIL) (2.5 MG/3ML) 0.083% nebulizer solution Take 3 mLs (  2.5 mg total) by nebulization every 4 (four) hours as needed for wheezing or shortness of breath. 06/15/22   Barb Merino, MD  albuterol (VENTOLIN HFA) 108 (90 Base) MCG/ACT inhaler Inhale 2 puffs into the lungs every 4 (four) hours as needed for wheezing or shortness of breath. 06/11/22   [provider]  ibuprofen (ADVIL) 600 MG tablet Take 600 mg by mouth daily.    [provider]    Review of Systems  Constitutional:  Positive for fatigue. Negative for appetite change.  HENT:  Positive for congestion and postnasal drip. Negative for sneezing.   Eyes: Negative.    Respiratory:  Positive for cough and shortness of breath. Negative for chest tightness and wheezing.   Cardiovascular:  Negative for chest pain, palpitations and leg swelling.  Gastrointestinal:  Negative for abdominal distention and abdominal pain.  Endocrine: Negative.   Genitourinary: Negative.   Musculoskeletal:  Negative for back pain and neck pain.  Skin: Negative.   Allergic/Immunologic: Negative.   Neurological:  Positive for headaches (w/ metoprolol). Negative for dizziness and light-headedness.  Hematological:  Negative for adenopathy. Does not bruise/bleed easily.  Psychiatric/Behavioral:  Negative for dysphoric mood and sleep disturbance (sleeping on 1 pillow). The patient is not nervous/anxious.    Vitals:   09/06/22 1012  BP: 137/77  Pulse: 72  SpO2: 100%  Weight: 116 lb (52.6 kg)   Wt Readings from Last 3 Encounters:  09/06/22 116 lb (52.6 kg)  08/19/22 116 lb (52.6 kg)  07/19/22 115 lb (52.2 kg)   Lab Results  Component Value Date   CREATININE 0.60 07/19/2022   CREATININE 1.14 (H) 06/28/2022   CREATININE 0.85 06/15/2022   Physical Exam Vitals and nursing note reviewed.  Constitutional:      Appearance: Normal appearance.  HENT:     Head: Normocephalic and atraumatic.  Cardiovascular:     Rate and Rhythm: Normal rate and regular rhythm.  Pulmonary:     Effort: Pulmonary effort is normal.     Breath sounds: No wheezing, rhonchi or rales.  Abdominal:     General: There is no distension.     Palpations: Abdomen is soft.     Tenderness: There is no abdominal tenderness.  Musculoskeletal:        General: No tenderness.     Cervical back: Normal range of motion and neck supple.     Right lower leg: No edema.     Left lower leg: No edema.  Skin:    General: Skin is warm and dry.  Neurological:     Mental Status: She is alert and oriented to person, place, and time.  Psychiatric:        Mood and Affect: Mood normal.        Behavior: Behavior normal.         Thought Content: Thought content normal.    Assessment & Plan:  1: Chronic heart failure with reduced ejection fraction- - NYHA class II - euvolemic today - weighing daily; reminded to call for an overnight weight gain of > 2 pounds or a weekly weight gain of > 5 pounds - weight unchanged from last visit here 2 weeks ago - adding "very little" salt and says that she's cut back a lot and is working on not adding any salt - drinking 3-4 glass of water and 2 cups of coffee and 1 wine cooler daily - jardiance '10mg'$  daily - entresto 24/'26mg'$  BID - metoprolol succinate '25mg'$  daily; can move this evening  so she will sleep through HA - add spironolactone '25mg'$  daily - BMP today, next week and in 1 month - BNP 06/12/22 was 1015.7  2: COPD- - saw PCP Vidal Schwalbe) 06/19/22 - symbicort daily along with PRN abuterol - BMP 07/19/22 showed sodium 140, potassium 3.9, creatinine 0.6 & GFR >60  3: Tobacco use- - has been smoking since she was 72 y/o - smoking 1 ppd cigarettes; has patches at home but says that she really doesn't want to quit smoking even though she knows she should - cessation discussed  4: Anemia- - hemoglobin 06/15/22 was 10.0   Medication bottles reviewed.  Return in 1 month, sooner if needed.

## 2022-09-13 ENCOUNTER — Other Ambulatory Visit
Admission: RE | Admit: 2022-09-13 | Discharge: 2022-09-13 | Disposition: A | Discharge status: Home / Self Care | Payer: Medicare Other | Attending: Family | Admitting: Family

## 2022-09-13 ENCOUNTER — Encounter: Payer: Self-pay | Admitting: Family

## 2022-09-13 DIAGNOSIS — I5022 Chronic systolic (congestive) heart failure: Secondary | ICD-10-CM | POA: Diagnosis present

## 2022-09-13 LAB — BASIC METABOLIC PANEL
Anion gap: 12 (ref 5–15)
BUN: 24 mg/dL — ABNORMAL HIGH (ref 8–23)
CO2: 25 mmol/L (ref 22–32)
Calcium: 9.2 mg/dL (ref 8.9–10.3)
Chloride: 107 mmol/L (ref 98–111)
Creatinine, Ser: 0.68 mg/dL (ref 0.44–1.00)
GFR, Estimated: >60 mL/min (ref >60)
Glucose, Bld: 93 mg/dL (ref 70–99)
Potassium: 3.9 mmol/L (ref 3.5–5.1)
Sodium: 144 mmol/L (ref 135–145)

## 2022-10-01 ENCOUNTER — Ambulatory Visit: Payer: Medicare Other | Attending: Internal Medicine

## 2022-10-07 ENCOUNTER — Encounter: Payer: Medicare Other | Admitting: Family

## 2022-10-14 ENCOUNTER — Ambulatory Visit: Payer: Medicare Other | Attending: Family | Admitting: Family

## 2022-10-14 ENCOUNTER — Encounter: Payer: Self-pay | Admitting: Family

## 2022-10-14 VITALS — BP 122/73 | HR 70 | Resp 16 | Wt 113.1 lb

## 2022-10-14 DIAGNOSIS — Z79899 Other long term (current) drug therapy: Secondary | ICD-10-CM | POA: Insufficient documentation

## 2022-10-14 DIAGNOSIS — D649 Anemia, unspecified: Secondary | ICD-10-CM | POA: Insufficient documentation

## 2022-10-14 DIAGNOSIS — F17210 Nicotine dependence, cigarettes, uncomplicated: Secondary | ICD-10-CM | POA: Diagnosis not present

## 2022-10-14 DIAGNOSIS — Z720 Tobacco use: Secondary | ICD-10-CM | POA: Diagnosis not present

## 2022-10-14 DIAGNOSIS — I5022 Chronic systolic (congestive) heart failure: Secondary | ICD-10-CM | POA: Diagnosis not present

## 2022-10-14 DIAGNOSIS — J449 Chronic obstructive pulmonary disease, unspecified: Secondary | ICD-10-CM | POA: Insufficient documentation

## 2022-10-14 NOTE — Progress Notes (Signed)
Patient ID: STARLETTA HOUCHIN, female    DOB: [birth year], 72 y.o.   MRN: [id number]  Primary cardiologist: None PCP: Gracelyn Nurse, MD (last seen 12/23)   HPI  Ms Gotwalt is a 72 y/o female with a history of COPD, thyroid disease, tobacco use and chronic heart failure.   Echo 06/12/22: EF of 40-45% along with mild/ moderate LVH and mild/ moderate MR/TR.   Admitted 06/12/22 due to COPD/ HF exacerbation.   She presents today for a HF follow-up visit with a chief complaint of minimal SOB with exertion. Chronic in nature. Has fatigue, cough and nasal congestion along with this. Denies difficulty sleeping, dizziness, abdominal distention, palpitations, pedal edema, chest pain, wheezing or weight gain.   Has tolerated spironolactone without any known side effects. Has upcoming appt w/ her PCP. Only taking furosemide PRN and hasn't had to take it in quite awhile. Reports no further HA's since moving her metoprolol from AM to PM.   Past Medical History:  Diagnosis Date   Anemia    CHF (congestive heart failure) (HCC)    COPD (chronic obstructive pulmonary disease) (HCC)    Dupuytren contracture    Hypothyroidism    Past Surgical History:  Procedure Laterality Date   BREAST EXCISIONAL BIOPSY Left 90s   benign   COLONOSCOPY  07/02/2007   COLONOSCOPY N/A 05/02/2021   Procedure: COLONOSCOPY;  Surgeon: Toledo, Boykin Nearing, MD;  Location: [facility] ENDOSCOPY;  Service: Gastroenterology;  Laterality: N/A;   MASTECTOMY PARTIAL / LUMPECTOMY Left    benign   TUBAL LIGATION  1976   Family History  Problem Relation Age of Onset   Cervical cancer Mother    Thyroid disease Mother    Prostate cancer Father    Cerebral aneurysm Sister    Social History   Tobacco Use   Smoking status: Former    Packs/day: 1.00    Years: 53.00    Additional pack years: 0.00    Total pack years: 53.00    Types: Cigarettes   Smokeless tobacco: Never  Substance Use Topics   Alcohol use: Yes    Comment: social    Allergies  Allergen Reactions   Penicillins Other (See Comments)    Patient states "NOT ALLERGIC" causes severe yeast infection. Prefers NOT to use it.   Prior to Admission medications   Medication Sig Start Date End Date Taking? Authorizing Provider  albuterol (PROVENTIL) (2.5 MG/3ML) 0.083% nebulizer solution Take 3 mLs (2.5 mg total) by nebulization every 4 (four) hours as needed for wheezing or shortness of breath. 06/15/22  Yes Dorcas Carrow, MD  albuterol (VENTOLIN HFA) 108 (90 Base) MCG/ACT inhaler Inhale 2 puffs into the lungs every 4 (four) hours as needed for wheezing or shortness of breath. 06/11/22  Yes [provider]  aspirin EC 81 MG tablet Take 1 tablet (81 mg total) by mouth daily. Swallow whole. 06/16/22  Yes Dorcas Carrow, MD  atorvastatin (LIPITOR) 40 MG tablet Take 1 tablet (40 mg total) by mouth daily. 07/19/22  Yes Ninel Abdella, Inetta Fermo A, FNP  budesonide-formoterol (SYMBICORT) 80-4.5 MCG/ACT inhaler Inhale 2 puffs into the lungs 2 (two) times daily. 07/22/22  Yes Clarisa Kindred A, FNP  cyanocobalamin (VITAMIN B12) 1000 MCG tablet Take 1 tablet (1,000 mcg total) by mouth daily. 07/19/22  Yes Clarisa Kindred A, FNP  empagliflozin (JARDIANCE) 10 MG TABS tablet Take 1 tablet (10 mg total) by mouth daily before breakfast. 07/19/22  Yes Clarisa Kindred A, FNP  ibuprofen (ADVIL) 600 MG tablet  Take 600 mg by mouth daily.   Yes [provider]  levothyroxine (SYNTHROID) 75 MCG tablet Take 75 mcg by mouth daily before breakfast.   Yes [provider]  metoprolol succinate (TOPROL XL) 25 MG 24 hr tablet Take 1 tablet (25 mg total) by mouth daily. 08/19/22  Yes Kaitlyn Skowron A, FNP  sacubitril-valsartan (ENTRESTO) 24-26 MG Take 1 tablet by mouth 2 (two) times daily. 07/19/22  Yes Clarisa Kindred A, FNP  spironolactone (ALDACTONE) 25 MG tablet Take 1 tablet (25 mg total) by mouth daily. 09/06/22  Yes Clarisa Kindred A, FNP  furosemide (LASIX) 40 MG tablet Take 1 tablet (40 mg  total) by mouth daily. Patient not taking: Reported on 10/14/2022 06/16/22   Dorcas Carrow, MD    Review of Systems  Constitutional:  Positive for fatigue. Negative for appetite change.  HENT:  Positive for congestion and postnasal drip. Negative for sneezing.   Eyes: Negative.   Respiratory:  Positive for cough and shortness of breath. Negative for chest tightness and wheezing.   Cardiovascular:  Negative for chest pain, palpitations and leg swelling.  Gastrointestinal:  Negative for abdominal distention and abdominal pain.  Endocrine: Negative.   Genitourinary: Negative.   Musculoskeletal:  Negative for back pain and neck pain.  Skin: Negative.   Allergic/Immunologic: Negative.   Neurological:  Negative for dizziness, light-headedness and headaches.  Hematological:  Negative for adenopathy. Does not bruise/bleed easily.  Psychiatric/Behavioral:  Negative for dysphoric mood and sleep disturbance (sleeping on 1 pillow). The patient is not nervous/anxious.    Vitals:   10/14/22 1055  BP: 122/73  Pulse: 70  Resp: 16  SpO2: 100%  Weight: 113 lb 2 oz (51.3 kg)   Wt Readings from Last 3 Encounters:  10/14/22 113 lb 2 oz (51.3 kg)  09/06/22 116 lb (52.6 kg)  08/19/22 116 lb (52.6 kg)   Lab Results  Component Value Date   CREATININE 0.68 09/13/2022   CREATININE 0.58 09/06/2022   CREATININE 0.60 07/19/2022   Physical Exam Vitals and nursing note reviewed.  Constitutional:      Appearance: Normal appearance.  HENT:     Head: Normocephalic and atraumatic.  Cardiovascular:     Rate and Rhythm: Normal rate and regular rhythm.  Pulmonary:     Effort: Pulmonary effort is normal.     Breath sounds: No wheezing, rhonchi or rales.  Abdominal:     General: There is no distension.     Palpations: Abdomen is soft.     Tenderness: There is no abdominal tenderness.  Musculoskeletal:        General: No tenderness.     Cervical back: Normal range of motion and neck supple.     Right  lower leg: No edema.     Left lower leg: No edema.  Skin:    General: Skin is warm and dry.  Neurological:     Mental Status: She is alert and oriented to person, place, and time.  Psychiatric:        Mood and Affect: Mood normal.        Behavior: Behavior normal.        Thought Content: Thought content normal.    Assessment & Plan:  1: Chronic heart failure with reduced ejection fraction- - NYHA class II - euvolemic today - weighing daily; reminded to call for an overnight weight gain of > 2 pounds or a weekly weight gain of > 5 pounds - weight 116 from last visit here 1  month ago - adding "very little" salt and says that she's cut back a lot and is working on not adding any salt - drinking 3-4 glass of water and 2 cups of coffee and 1 wine cooler daily - continue jardiance 10mg  daily - continue entresto 24/26mg  BID - continue metoprolol succinate 25mg  daily; no more HA's since moving this to evening - continue spironolactone 25mg  daily - discussed titrating up entresto at next visit and then getting echo repeated - BMP today - BNP 06/12/22 was 1015.7  2: COPD- - saw PCP (Tumey) 06/19/22 - symbicort daily along with PRN abuterol - BMP 10/11/22 showed sodium 142, potassium 4.3, creatinine 0.7 & GFR 91  3: Tobacco use- - has been smoking since she was 72 y/o - smoking 1 ppd cigarettes; has patches at home but says that she really doesn't want to quit smoking even though she knows she should - discussed health coaching [employer]) and she would like to think about it; health coach flyer given to patient  4: Anemia- - hemoglobin 10/11/22 was 12.5  Return in 1 month, sooner if needed.

## 2022-10-15 ENCOUNTER — Other Ambulatory Visit: Payer: Self-pay

## 2022-10-15 DIAGNOSIS — Z87891 Personal history of nicotine dependence: Secondary | ICD-10-CM

## 2022-10-15 DIAGNOSIS — F17210 Nicotine dependence, cigarettes, uncomplicated: Secondary | ICD-10-CM

## 2022-11-05 ENCOUNTER — Ambulatory Visit
Admission: RE | Admit: 2022-11-05 | Discharge: 2022-11-05 | Disposition: A | Discharge status: Home / Self Care | Payer: Medicare Other | Source: Ambulatory Visit | Attending: Acute Care | Admitting: Acute Care

## 2022-11-05 DIAGNOSIS — F17210 Nicotine dependence, cigarettes, uncomplicated: Secondary | ICD-10-CM | POA: Diagnosis not present

## 2022-11-05 DIAGNOSIS — Z87891 Personal history of nicotine dependence: Secondary | ICD-10-CM | POA: Insufficient documentation

## 2022-11-08 ENCOUNTER — Telehealth: Payer: Self-pay | Admitting: Acute Care

## 2022-11-08 DIAGNOSIS — R911 Solitary pulmonary nodule: Secondary | ICD-10-CM

## 2022-11-08 NOTE — Telephone Encounter (Signed)
Pt  CT scan results call report

## 2022-11-08 NOTE — Telephone Encounter (Signed)
Called and spoke with pt regarding lung screening CT results. Advised that a nodule seen on her last scan has grown and we need to do further testing to look at it closer with a PET scan and follow up with one of our physicians in [place] after the PET is done. Pt verbalized understanding and is in agreement with this plan. PET scan order placed. CT results faxed to PCP with follow plans included.

## 2022-11-12 ENCOUNTER — Telehealth: Payer: Self-pay | Admitting: Acute Care

## 2022-11-12 NOTE — Telephone Encounter (Signed)
I have been notified by [facility] that the PET scan is pending denial. It is currently scheduled on 11/14/22. Would you be willing to do peer to peer? What time would work for you so I can scheduled the peer to peer

## 2022-11-12 NOTE — Telephone Encounter (Signed)
The peer to peer has been scheduled for 11/13/22 @ 4:00pm please put on Sarah's schedule and I need the phone number as to where the MD will be calling

## 2022-11-13 ENCOUNTER — Telehealth: Payer: Self-pay | Admitting: Acute Care

## 2022-11-13 ENCOUNTER — Ambulatory Visit: Payer: Medicare Other | Admitting: Acute Care

## 2022-11-13 NOTE — Telephone Encounter (Signed)
Received a call from [facility] Radiology. We had a question about the left upper lobe nodule that was measured at 7.3 mm`and growing, designated a LR 4 B. On the previous years scan the nodule was documented as 12.8 mm. Dr. Reche Dixon clarified that the reading 3/23 was a typographical error and that the nodule in 2023 was 4-5 mm , and is now 7.3 mm.  The PET scan has been denied by insurance. We will schedule patient with Dr. Jayme Cloud and have her undergo Nodify Blood testing , then most likely a 3 month follow up CT Chest.  Dr. Jayme Cloud has reviewed the films and has determined this is the best plan in light of insurance denial of PET scan.  Angelique Blonder and [place], can you call the patient and explain this to her, and get her scheduled in [place]? Thanks so much !!

## 2022-11-14 ENCOUNTER — Ambulatory Visit (INDEPENDENT_AMBULATORY_CARE_PROVIDER_SITE_OTHER): Payer: Medicare Other | Admitting: Student in an Organized Health Care Education/Training Program

## 2022-11-14 ENCOUNTER — Institutional Professional Consult (permissible substitution): Payer: Medicare Other | Admitting: Student in an Organized Health Care Education/Training Program

## 2022-11-14 ENCOUNTER — Encounter: Payer: Self-pay | Admitting: Student in an Organized Health Care Education/Training Program

## 2022-11-14 VITALS — BP 122/78 | HR 75 | Temp 97.6°F | Ht 63.0 in | Wt 111.0 lb

## 2022-11-14 DIAGNOSIS — R911 Solitary pulmonary nodule: Secondary | ICD-10-CM | POA: Diagnosis not present

## 2022-11-14 NOTE — Telephone Encounter (Signed)
Bevelyn Ngo, NP      11/13/22  4:59 PM Note Received a call from [facility] Radiology. We had a question about the left upper lobe nodule that was measured at 7.3 mm`and growing, designated a LR 4 B. On the previous years scan the nodule was documented as 12.8 mm. Dr. Reche Dixon clarified that the reading 3/23 was a typographical error and that the nodule in 2023 was 4-5 mm , and is now 7.3 mm.  The PET scan has been denied by insurance. We will schedule patient with Dr. Jayme Cloud and have her undergo Nodify Blood testing , then most likely a 3 month follow up CT Chest.  Dr. Jayme Cloud has reviewed the films and has determined this is the best plan in light of insurance denial of PET scan.  Angelique Blonder and [place], can you call the patient and explain this to her, and get her scheduled in [place]? Thanks so much !!

## 2022-11-14 NOTE — H&P (View-Only) (Signed)
 Synopsis: Referred in for lung nodule by Johnston, John D, MD  Assessment & Plan:   #Lung Nodule  Nodule Location: LUL Nodule Size: 8 mm Nodule Spiculation: Yes Associated Lymphadenopathy: No Smoking Status (current/) and pack years = 53 Extrathoracic cancer > 5 years prior (no) SPN malignancy risk score ([facility] Model): 40 %risk of malignancy for spiculated nodules, 20% for non-spiculated ECOG: 0  The patient is here to discuss their imaging abnormalities which include a LUL nodule. This nodule was present last year on my review of last year's lung cancer screening CT, and has grown on interval repeat CT. The nodule carries a risk of at least 20% of malignancy (assuming non-spiculation on SPN model) to 40% (assuming spiculation on SPN model). I am inclined to risk stratify this nodule on the higher side given growth over the past year.  We discussed the importance of diagnosis and staging in lung malignancies, and the approach to obtaining a tissue diagnosis which would include robotic assisted navigational bronchoscopy with endobronchial ultrasound guided sampling. We also discussed possibility of straight out surgical resection of the nodule.  I explained to the patient the risks associated with a biopsy procedure which include a 2% risk of pneumothorax, infection, bleeding, and nondiagnostic procedure in detail. We agreed that the risk of malignancy in the nodule far exceeds the risk of any biopsy procedure. I explained that patients typically are able to return home the same day of the procedure, but in rare cases admission to the hospital for observation and treatment is required.  After our discussion, the patient would like to discuss this further with her husband and will call us back tomorrow with a decision regarding biopsy.  Recommendations: Robotic assisted navigational bronchoscopy, EBUS Patient to call us with her decision   Return if symptoms worsen or fail to  improve.  I spent 45 minutes caring for this patient today, including preparing to see the patient, obtaining a medical history , reviewing a separately obtained history, performing a medically appropriate examination and/or evaluation, counseling and educating the patient/family/caregiver, documenting clinical information in the electronic health record, and independently interpreting results (not separately reported/billed) and communicating results to the patient/family/caregiver  Bridney Guadarrama, MD [facility] 11/14/2022 9:46 AM    End of visit medications:  No orders of the defined types were placed in this encounter.    Current Outpatient Medications:    albuterol (PROVENTIL) (2.5 MG/3ML) 0.083% nebulizer solution, Take 3 mLs (2.5 mg total) by nebulization every 4 (four) hours as needed for wheezing or shortness of breath., Disp: 75 mL, Rfl: 12   albuterol (VENTOLIN HFA) 108 (90 Base) MCG/ACT inhaler, Inhale 2 puffs into the lungs every 4 (four) hours as needed for wheezing or shortness of breath., Disp: , Rfl:    aspirin EC 81 MG tablet, Take 1 tablet (81 mg total) by mouth daily. Swallow whole., Disp: 30 tablet, Rfl: 12   atorvastatin (LIPITOR) 40 MG tablet, Take 1 tablet (40 mg total) by mouth daily., Disp: 30 tablet, Rfl: 5   budesonide-formoterol (SYMBICORT) 80-4.5 MCG/ACT inhaler, Inhale 2 puffs into the lungs 2 (two) times daily., Disp: 3 each, Rfl: 3   cyanocobalamin (VITAMIN B12) 1000 MCG tablet, Take 1 tablet (1,000 mcg total) by mouth daily., Disp: 30 tablet, Rfl: 5   empagliflozin (JARDIANCE) 10 MG TABS tablet, Take 1 tablet (10 mg total) by mouth daily before breakfast., Disp: 90 tablet, Rfl: 3   furosemide (LASIX) 40 MG tablet, Take 1   tablet (40 mg total) by mouth daily., Disp: 30 tablet, Rfl: 0   ibuprofen (ADVIL) 600 MG tablet, Take 600 mg by mouth daily., Disp: , Rfl:    levothyroxine (SYNTHROID) 75 MCG tablet, Take 75 mcg by mouth daily before  breakfast., Disp: , Rfl:    metoprolol succinate (TOPROL XL) 25 MG 24 hr tablet, Take 1 tablet (25 mg total) by mouth daily., Disp: 30 tablet, Rfl: 5   sacubitril-valsartan (ENTRESTO) 24-26 MG, Take 1 tablet by mouth 2 (two) times daily., Disp: 180 tablet, Rfl: 3   spironolactone (ALDACTONE) 25 MG tablet, Take 1 tablet (25 mg total) by mouth daily., Disp: 30 tablet, Rfl: 5   Subjective:   PATIENT ID: Denise Allen GENDER: female DOB: 08/02/1950, MRN: [id number]  Chief Complaint  Patient presents with   Follow-up    CT 11/05/2022-dry cough at times prod with clear sputum.     HPI  Patient is a pleasant 71-year-old female with a past medical history of COPD, followed by her primary care physician, who presents to clinic for the evaluation of a lung nodule.  Patient was enrolled in our lung cancer screening program and was noted to have a small left upper lobe nodule 1 year ago.  On repeat CT scan this year, the nodule appears to have grown and is currently measuring up to 8 mm in diameter.  Patient is referred for evaluation.  PET/CT was ordered which was not approved by insurance prior to this visit.  Patient reports that her symptoms are at baseline.  She has some exertional dyspnea when she goes up a hill (her backyard is on an incline and going up said hill makes her short of breath).  She has a constant runny nose that is nothing new and is a chronic.  She does have an occasional cough but denies any wheezing or chest tightness. She was admitted in December to the hospital for a COPD exacerbation treated with antibiotics, steroids, and diuretics.  She has been referred to the heart failure team and is prescribed goal-directed medical therapy for HFmrEF (Jardiance, Entresto, metoprolol, spironolactone, and as needed furosemide).  She is maintained on Symbicort and albuterol.  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  She has a longstanding history of smoking (53 pack years).   She does not have any occupational exposures.  No personal history of lung cancer.  Ancillary information including prior medications, full medical/surgical/family/social histories, and PFTs (when available) are listed below and have been reviewed.   Chest CT 09/2021 Lungs/Pleura: Centrilobular and paraseptal emphysema evident. Biapical pleuroparenchymal scarring again noted. As before, tiny pulmonary nodules are identified, stable. No new suspicious pulmonary nodule or mass. No focal airspace consolidation. No pleural effusion.  Chest CT 10/2022  IMPRESSION: 1. Lung-RADS 4B, suspicious. Additional imaging evaluation or consultation with Pulmonology or Thoracic Surgery recommended. Enlarging left upper lobe pulmonary nodule of 7.3 mm. Potential clinical strategies include three-month follow-up diagnostic chest CT, PET (low end of resolution) or biopsy.  Review of Systems  Constitutional:  Negative for chills, fever, malaise/fatigue and weight loss.  Respiratory:  Positive for cough and shortness of breath. Negative for hemoptysis, sputum production and wheezing.   Cardiovascular:  Negative for chest pain.     Objective:   Vitals:   11/14/22 0918  BP: 122/78  Pulse: 75  Temp: 97.6 F (36.4 C)  TempSrc: Temporal  SpO2: 98%  Weight: 111 lb (50.3 kg)  Height: 5' 3" (1.6 m)     98% on RA BMI Readings from Last 3 Encounters:  11/14/22 19.66 kg/m  11/05/22 20.02 kg/m  10/14/22 20.04 kg/m   Wt Readings from Last 3 Encounters:  11/14/22 111 lb (50.3 kg)  11/05/22 113 lb (51.3 kg)  10/14/22 113 lb 2 oz (51.3 kg)    Physical Exam Constitutional:      Appearance: Normal appearance.  HENT:     Head: Normocephalic.     Mouth/Throat:     Mouth: Mucous membranes are moist.  Cardiovascular:     Rate and Rhythm: Normal rate and regular rhythm.     Pulses: Normal pulses.     Heart sounds: Normal heart sounds.  Pulmonary:     Effort: Pulmonary effort is normal.     Breath  sounds: Normal breath sounds. No wheezing, rhonchi or rales.  Abdominal:     Palpations: Abdomen is soft.  Musculoskeletal:     Right lower leg: No edema.     Left lower leg: No edema.  Neurological:     General: No focal deficit present.     Mental Status: She is alert and oriented to person, place, and time. Mental status is at baseline.       Ancillary Information    Past Medical History:  Diagnosis Date   Anemia    CHF (congestive heart failure) (HCC)    COPD (chronic obstructive pulmonary disease) (HCC)    Dupuytren contracture    Hypothyroidism      Family History  Problem Relation Age of Onset   Cervical cancer Mother    Thyroid disease Mother    Prostate cancer Father    Cerebral aneurysm Sister      Past Surgical History:  Procedure Laterality Date   BREAST EXCISIONAL BIOPSY Left 90s   benign   COLONOSCOPY  07/02/2007   COLONOSCOPY N/A 05/02/2021   Procedure: COLONOSCOPY;  Surgeon: Toledo, Teodoro K, MD;  Location: [facility] ENDOSCOPY;  Service: Gastroenterology;  Laterality: N/A;   MASTECTOMY PARTIAL / LUMPECTOMY Left    benign   TUBAL LIGATION  1976    Social History   Socioeconomic History   Marital status: Married    Spouse name: Daniel Aymond   Number of children: Not on file   Years of education: Not on file   Highest education level: Not on file  Occupational History   Not on file  Tobacco Use   Smoking status: Every Day    Packs/day: 1.00    Years: 53.00    Additional pack years: 0.00    Total pack years: 53.00    Types: Cigarettes   Smokeless tobacco: Never  Vaping Use   Vaping Use: Never used  Substance and Sexual Activity   Alcohol use: Yes    Alcohol/week: 6.0 standard drinks of alcohol    Types: 6 Glasses of wine per week    Comment: wine coolers   Drug use: Never   Sexual activity: Yes  Other Topics Concern   Not on file  Social History Narrative   Not on file   Social Determinants of Health   Financial Resource Strain:  Not on file  Food Insecurity: No Food Insecurity (06/13/2022)   Hunger Vital Sign    Worried About Running Out of Food in the Last Year: Never true    Ran Out of Food in the Last Year: Never true  Transportation Needs: No Transportation Needs (06/13/2022)   PRAPARE - Transportation    Lack of Transportation (Medical): No      Lack of Transportation (Non-Medical): No  Physical Activity: Not on file  Stress: Not on file  Social Connections: Not on file  Intimate Partner Violence: Not At Risk (06/13/2022)   Humiliation, Afraid, Rape, and Kick questionnaire    Fear of Current or Ex-Partner: No    Emotionally Abused: No    Physically Abused: No    Sexually Abused: No     Allergies  Allergen Reactions   Penicillins Other (See Comments)    Patient states "NOT ALLERGIC" causes severe yeast infection. Prefers NOT to use it.     CBC    Component Value Date/Time   WBC 18.7 (H) 06/15/2022 0432   RBC 3.36 (L) 06/15/2022 0432   HGB 10.0 (L) 06/15/2022 0432   HCT 31.2 (L) 06/15/2022 0432   PLT 435 (H) 06/15/2022 0432   MCV 92.9 06/15/2022 0432   MCH 29.8 06/15/2022 0432   MCHC 32.1 06/15/2022 0432   RDW 13.3 06/15/2022 0432   LYMPHSABS 1.2 06/15/2022 0432   MONOABS 0.8 06/15/2022 0432   EOSABS 0.0 06/15/2022 0432   BASOSABS 0.0 06/15/2022 0432    Pulmonary Functions Testing Results:     No data to display          Outpatient Medications Prior to Visit  Medication Sig Dispense Refill   albuterol (PROVENTIL) (2.5 MG/3ML) 0.083% nebulizer solution Take 3 mLs (2.5 mg total) by nebulization every 4 (four) hours as needed for wheezing or shortness of breath. 75 mL 12   albuterol (VENTOLIN HFA) 108 (90 Base) MCG/ACT inhaler Inhale 2 puffs into the lungs every 4 (four) hours as needed for wheezing or shortness of breath.     aspirin EC 81 MG tablet Take 1 tablet (81 mg total) by mouth daily. Swallow whole. 30 tablet 12   atorvastatin (LIPITOR) 40 MG tablet Take 1 tablet (40 mg total)  by mouth daily. 30 tablet 5   budesonide-formoterol (SYMBICORT) 80-4.5 MCG/ACT inhaler Inhale 2 puffs into the lungs 2 (two) times daily. 3 each 3   cyanocobalamin (VITAMIN B12) 1000 MCG tablet Take 1 tablet (1,000 mcg total) by mouth daily. 30 tablet 5   empagliflozin (JARDIANCE) 10 MG TABS tablet Take 1 tablet (10 mg total) by mouth daily before breakfast. 90 tablet 3   furosemide (LASIX) 40 MG tablet Take 1 tablet (40 mg total) by mouth daily. 30 tablet 0   ibuprofen (ADVIL) 600 MG tablet Take 600 mg by mouth daily.     levothyroxine (SYNTHROID) 75 MCG tablet Take 75 mcg by mouth daily before breakfast.     metoprolol succinate (TOPROL XL) 25 MG 24 hr tablet Take 1 tablet (25 mg total) by mouth daily. 30 tablet 5   sacubitril-valsartan (ENTRESTO) 24-26 MG Take 1 tablet by mouth 2 (two) times daily. 180 tablet 3   spironolactone (ALDACTONE) 25 MG tablet Take 1 tablet (25 mg total) by mouth daily. 30 tablet 5   No facility-administered medications prior to visit.   

## 2022-11-14 NOTE — Progress Notes (Signed)
Synopsis: Referred in for lung nodule by Gracelyn Nurse, MD  Assessment & Plan:   #Lung Nodule  Nodule Location: LUL Nodule Size: 8 mm Nodule Spiculation: Yes Associated Lymphadenopathy: No Smoking Status (current/) and pack years = 53 Extrathoracic cancer > 5 years prior (no) SPN malignancy risk score [facility]): 40 %risk of malignancy for spiculated nodules, 20% for non-spiculated ECOG: 0  The patient is here to discuss their imaging abnormalities which include a LUL nodule. This nodule was present last year on my review of last year's lung cancer screening CT, and has grown on interval repeat CT. The nodule carries a risk of at least 20% of malignancy (assuming non-spiculation on SPN model) to 40% (assuming spiculation on SPN model). I am inclined to risk stratify this nodule on the higher side given growth over the past year.  We discussed the importance of diagnosis and staging in lung malignancies, and the approach to obtaining a tissue diagnosis which would include robotic assisted navigational bronchoscopy with endobronchial ultrasound guided sampling. We also discussed possibility of straight out surgical resection of the nodule.  I explained to the patient the risks associated with a biopsy procedure which include a 2% risk of pneumothorax, infection, bleeding, and nondiagnostic procedure in detail. We agreed that the risk of malignancy in the nodule far exceeds the risk of any biopsy procedure. I explained that patients typically are able to return home the same day of the procedure, but in rare cases admission to the hospital for observation and treatment is required.  After our discussion, the patient would like to discuss this further with her husband and will call us back tomorrow with a decision regarding biopsy.  Recommendations: Robotic assisted navigational bronchoscopy, EBUS Patient to call us with her decision   Return if symptoms worsen or fail to  improve.  I spent 45 minutes caring for this patient today, including preparing to see the patient, obtaining a medical history , reviewing a separately obtained history, performing a medically appropriate examination and/or evaluation, counseling and educating the patient/family/caregiver, documenting clinical information in the electronic health record, and independently interpreting results (not separately reported/billed) and communicating results to the patient/family/caregiver  Raechel Chute, MD [facility] 11/14/2022 9:46 AM    End of visit medications:  No orders of the defined types were placed in this encounter.    Current Outpatient Medications:    albuterol (PROVENTIL) (2.5 MG/3ML) 0.083% nebulizer solution, Take 3 mLs (2.5 mg total) by nebulization every 4 (four) hours as needed for wheezing or shortness of breath., Disp: 75 mL, Rfl: 12   albuterol (VENTOLIN HFA) 108 (90 Base) MCG/ACT inhaler, Inhale 2 puffs into the lungs every 4 (four) hours as needed for wheezing or shortness of breath., Disp: , Rfl:    aspirin EC 81 MG tablet, Take 1 tablet (81 mg total) by mouth daily. Swallow whole., Disp: 30 tablet, Rfl: 12   atorvastatin (LIPITOR) 40 MG tablet, Take 1 tablet (40 mg total) by mouth daily., Disp: 30 tablet, Rfl: 5   budesonide-formoterol (SYMBICORT) 80-4.5 MCG/ACT inhaler, Inhale 2 puffs into the lungs 2 (two) times daily., Disp: 3 each, Rfl: 3   cyanocobalamin (VITAMIN B12) 1000 MCG tablet, Take 1 tablet (1,000 mcg total) by mouth daily., Disp: 30 tablet, Rfl: 5   empagliflozin (JARDIANCE) 10 MG TABS tablet, Take 1 tablet (10 mg total) by mouth daily before breakfast., Disp: 90 tablet, Rfl: 3   furosemide (LASIX) 40 MG tablet, Take 1  tablet (40 mg total) by mouth daily., Disp: 30 tablet, Rfl: 0   ibuprofen (ADVIL) 600 MG tablet, Take 600 mg by mouth daily., Disp: , Rfl:    levothyroxine (SYNTHROID) 75 MCG tablet, Take 75 mcg by mouth daily before  breakfast., Disp: , Rfl:    metoprolol succinate (TOPROL XL) 25 MG 24 hr tablet, Take 1 tablet (25 mg total) by mouth daily., Disp: 30 tablet, Rfl: 5   sacubitril-valsartan (ENTRESTO) 24-26 MG, Take 1 tablet by mouth 2 (two) times daily., Disp: 180 tablet, Rfl: 3   spironolactone (ALDACTONE) 25 MG tablet, Take 1 tablet (25 mg total) by mouth daily., Disp: 30 tablet, Rfl: 5   Subjective:   PATIENT ID: Denise Allen Sahagun GENDER: female DOB: Jan 19, 1951, MRN: [id number]  Chief Complaint  Patient presents with   Follow-up    CT 11/05/2022-dry cough at times prod with clear sputum.     HPI  Patient is a pleasant 72 year old female with a past medical history of COPD, followed by her primary care physician, who presents to clinic for the evaluation of a lung nodule.  Patient was enrolled in our lung cancer screening program and was noted to have a small left upper lobe nodule 1 year ago.  On repeat CT scan this year, the nodule appears to have grown and is currently measuring up to 8 mm in diameter.  Patient is referred for evaluation.  PET/CT was ordered which was not approved by insurance prior to this visit.  Patient reports that her symptoms are at baseline.  She has some exertional dyspnea when she goes up a hill (her backyard is on an incline and going up said hill makes her short of breath).  She has a constant runny nose that is nothing new and is a chronic.  She does have an occasional cough but denies any wheezing or chest tightness. She was admitted in December to the hospital for a COPD exacerbation treated with antibiotics, steroids, and diuretics.  She has been referred to the heart failure team and is prescribed goal-directed medical therapy for HFmrEF (Jardiance, Entresto, metoprolol, spironolactone, and as needed furosemide).  She is maintained on Symbicort and albuterol.  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  She has a longstanding history of smoking (53 pack years).   She does not have any occupational exposures.  No personal history of lung cancer.  Ancillary information including prior medications, full medical/surgical/family/social histories, and PFTs (when available) are listed below and have been reviewed.   Chest CT 09/2021 Lungs/Pleura: Centrilobular and paraseptal emphysema evident. Biapical pleuroparenchymal scarring again noted. As before, tiny pulmonary nodules are identified, stable. No new suspicious pulmonary nodule or mass. No focal airspace consolidation. No pleural effusion.  Chest CT 10/2022  IMPRESSION: 1. Lung-RADS 4B, suspicious. Additional imaging evaluation or consultation with Pulmonology or Thoracic Surgery recommended. Enlarging left upper lobe pulmonary nodule of 7.3 mm. Potential clinical strategies include three-month follow-up diagnostic chest CT, PET (low end of resolution) or biopsy.  Review of Systems  Constitutional:  Negative for chills, fever, malaise/fatigue and weight loss.  Respiratory:  Positive for cough and shortness of breath. Negative for hemoptysis, sputum production and wheezing.   Cardiovascular:  Negative for chest pain.     Objective:   Vitals:   11/14/22 0918  BP: 122/78  Pulse: 75  Temp: 97.6 F (36.4 C)  TempSrc: Temporal  SpO2: 98%  Weight: 111 lb (50.3 kg)  Height: 5\' 3"  (1.6 m)  98% on RA BMI Readings from Last 3 Encounters:  11/14/22 19.66 kg/m  11/05/22 20.02 kg/m  10/14/22 20.04 kg/m   Wt Readings from Last 3 Encounters:  11/14/22 111 lb (50.3 kg)  11/05/22 113 lb (51.3 kg)  10/14/22 113 lb 2 oz (51.3 kg)    Physical Exam Constitutional:      Appearance: Normal appearance.  HENT:     Head: Normocephalic.     Mouth/Throat:     Mouth: Mucous membranes are moist.  Cardiovascular:     Rate and Rhythm: Normal rate and regular rhythm.     Pulses: Normal pulses.     Heart sounds: Normal heart sounds.  Pulmonary:     Effort: Pulmonary effort is normal.     Breath  sounds: Normal breath sounds. No wheezing, rhonchi or rales.  Abdominal:     Palpations: Abdomen is soft.  Musculoskeletal:     Right lower leg: No edema.     Left lower leg: No edema.  Neurological:     General: No focal deficit present.     Mental Status: She is alert and oriented to person, place, and time. Mental status is at baseline.       Ancillary Information    Past Medical History:  Diagnosis Date   Anemia    CHF (congestive heart failure) (HCC)    COPD (chronic obstructive pulmonary disease) (HCC)    Dupuytren contracture    Hypothyroidism      Family History  Problem Relation Age of Onset   Cervical cancer Mother    Thyroid disease Mother    Prostate cancer Father    Cerebral aneurysm Sister      Past Surgical History:  Procedure Laterality Date   BREAST EXCISIONAL BIOPSY Left 90s   benign   COLONOSCOPY  07/02/2007   COLONOSCOPY N/A 05/02/2021   Procedure: COLONOSCOPY;  Surgeon: Toledo, Boykin Nearing, MD;  Location: [facility] ENDOSCOPY;  Service: Gastroenterology;  Laterality: N/A;   MASTECTOMY PARTIAL / LUMPECTOMY Left    benign   TUBAL LIGATION  1976    Social History   Socioeconomic History   Marital status: Married    Spouse name: Floria Rowlee   Number of children: Not on file   Years of education: Not on file   Highest education level: Not on file  Occupational History   Not on file  Tobacco Use   Smoking status: Every Day    Packs/day: 1.00    Years: 53.00    Additional pack years: 0.00    Total pack years: 53.00    Types: Cigarettes   Smokeless tobacco: Never  Vaping Use   Vaping Use: Never used  Substance and Sexual Activity   Alcohol use: Yes    Alcohol/week: 6.0 standard drinks of alcohol    Types: 6 Glasses of wine per week    Comment: wine coolers   Drug use: Never   Sexual activity: Yes  Other Topics Concern   Not on file  Social History Narrative   Not on file   Social Determinants of Health   Financial Resource Strain:  Not on file  Food Insecurity: No Food Insecurity (06/13/2022)   Hunger Vital Sign    Worried About Running Out of Food in the Last Year: Never true    Ran Out of Food in the Last Year: Never true  Transportation Needs: No Transportation Needs (06/13/2022)   PRAPARE - Administrator, Civil Service (Medical): No  Lack of Transportation (Non-Medical): No  Physical Activity: Not on file  Stress: Not on file  Social Connections: Not on file  Intimate Partner Violence: Not At Risk (06/13/2022)   Humiliation, Afraid, Rape, and Kick questionnaire    Fear of Current or Ex-Partner: No    Emotionally Abused: No    Physically Abused: No    Sexually Abused: No     Allergies  Allergen Reactions   Penicillins Other (See Comments)    Patient states "NOT ALLERGIC" causes severe yeast infection. Prefers NOT to use it.     CBC    Component Value Date/Time   WBC 18.7 (H) 06/15/2022 0432   RBC 3.36 (L) 06/15/2022 0432   HGB 10.0 (L) 06/15/2022 0432   HCT 31.2 (L) 06/15/2022 0432   PLT 435 (H) 06/15/2022 0432   MCV 92.9 06/15/2022 0432   MCH 29.8 06/15/2022 0432   MCHC 32.1 06/15/2022 0432   RDW 13.3 06/15/2022 0432   LYMPHSABS 1.2 06/15/2022 0432   MONOABS 0.8 06/15/2022 0432   EOSABS 0.0 06/15/2022 0432   BASOSABS 0.0 06/15/2022 0432    Pulmonary Functions Testing Results:     No data to display          Outpatient Medications Prior to Visit  Medication Sig Dispense Refill   albuterol (PROVENTIL) (2.5 MG/3ML) 0.083% nebulizer solution Take 3 mLs (2.5 mg total) by nebulization every 4 (four) hours as needed for wheezing or shortness of breath. 75 mL 12   albuterol (VENTOLIN HFA) 108 (90 Base) MCG/ACT inhaler Inhale 2 puffs into the lungs every 4 (four) hours as needed for wheezing or shortness of breath.     aspirin EC 81 MG tablet Take 1 tablet (81 mg total) by mouth daily. Swallow whole. 30 tablet 12   atorvastatin (LIPITOR) 40 MG tablet Take 1 tablet (40 mg total)  by mouth daily. 30 tablet 5   budesonide-formoterol (SYMBICORT) 80-4.5 MCG/ACT inhaler Inhale 2 puffs into the lungs 2 (two) times daily. 3 each 3   cyanocobalamin (VITAMIN B12) 1000 MCG tablet Take 1 tablet (1,000 mcg total) by mouth daily. 30 tablet 5   empagliflozin (JARDIANCE) 10 MG TABS tablet Take 1 tablet (10 mg total) by mouth daily before breakfast. 90 tablet 3   furosemide (LASIX) 40 MG tablet Take 1 tablet (40 mg total) by mouth daily. 30 tablet 0   ibuprofen (ADVIL) 600 MG tablet Take 600 mg by mouth daily.     levothyroxine (SYNTHROID) 75 MCG tablet Take 75 mcg by mouth daily before breakfast.     metoprolol succinate (TOPROL XL) 25 MG 24 hr tablet Take 1 tablet (25 mg total) by mouth daily. 30 tablet 5   sacubitril-valsartan (ENTRESTO) 24-26 MG Take 1 tablet by mouth 2 (two) times daily. 180 tablet 3   spironolactone (ALDACTONE) 25 MG tablet Take 1 tablet (25 mg total) by mouth daily. 30 tablet 5   No facility-administered medications prior to visit.

## 2022-11-14 NOTE — Telephone Encounter (Signed)
Called and spoke with pt. She has an appt with Dr Aundria Rud this morning and will discuss further testing then. Nothing further needed.

## 2022-11-15 ENCOUNTER — Other Ambulatory Visit: Payer: Self-pay | Admitting: Student in an Organized Health Care Education/Training Program

## 2022-11-15 ENCOUNTER — Encounter: Payer: Self-pay | Admitting: Student in an Organized Health Care Education/Training Program

## 2022-11-15 ENCOUNTER — Other Ambulatory Visit: Payer: Self-pay | Admitting: Family

## 2022-11-15 ENCOUNTER — Encounter: Payer: Self-pay | Admitting: Family

## 2022-11-15 ENCOUNTER — Ambulatory Visit: Payer: Medicare Other | Attending: Family | Admitting: Family

## 2022-11-15 VITALS — BP 127/67 | HR 70 | Temp 97.8°F | Wt 111.2 lb

## 2022-11-15 DIAGNOSIS — D649 Anemia, unspecified: Secondary | ICD-10-CM | POA: Diagnosis not present

## 2022-11-15 DIAGNOSIS — Z79899 Other long term (current) drug therapy: Secondary | ICD-10-CM | POA: Diagnosis not present

## 2022-11-15 DIAGNOSIS — Z7951 Long term (current) use of inhaled steroids: Secondary | ICD-10-CM | POA: Diagnosis not present

## 2022-11-15 DIAGNOSIS — I5022 Chronic systolic (congestive) heart failure: Secondary | ICD-10-CM | POA: Insufficient documentation

## 2022-11-15 DIAGNOSIS — J449 Chronic obstructive pulmonary disease, unspecified: Secondary | ICD-10-CM | POA: Diagnosis not present

## 2022-11-15 DIAGNOSIS — Z7982 Long term (current) use of aspirin: Secondary | ICD-10-CM | POA: Insufficient documentation

## 2022-11-15 DIAGNOSIS — R911 Solitary pulmonary nodule: Secondary | ICD-10-CM | POA: Insufficient documentation

## 2022-11-15 DIAGNOSIS — E039 Hypothyroidism, unspecified: Secondary | ICD-10-CM | POA: Insufficient documentation

## 2022-11-15 DIAGNOSIS — Z720 Tobacco use: Secondary | ICD-10-CM

## 2022-11-15 DIAGNOSIS — Z7984 Long term (current) use of oral hypoglycemic drugs: Secondary | ICD-10-CM | POA: Insufficient documentation

## 2022-11-15 DIAGNOSIS — Z791 Long term (current) use of non-steroidal anti-inflammatories (NSAID): Secondary | ICD-10-CM | POA: Insufficient documentation

## 2022-11-15 DIAGNOSIS — F17210 Nicotine dependence, cigarettes, uncomplicated: Secondary | ICD-10-CM | POA: Diagnosis not present

## 2022-11-15 DIAGNOSIS — Z79890 Hormone replacement therapy (postmenopausal): Secondary | ICD-10-CM | POA: Insufficient documentation

## 2022-11-15 MED ORDER — SACUBITRIL-VALSARTAN 49-51 MG PO TABS: 1.0000 | ORAL_TABLET | Freq: Two times a day (BID) | ORAL | 3 refills | Status: DC

## 2022-11-15 MED ORDER — METOPROLOL SUCCINATE ER 25 MG PO TB24: 25.0000 mg | ORAL_TABLET | Freq: Every day | ORAL | 5 refills | Status: DC

## 2022-11-15 NOTE — Progress Notes (Signed)
[facility] HE[facility] FAILURE CLINIC - Pharmacist Note  Denise Allen is a 72 y.o. female with HFmrEF (EF 41-49%) presenting to the Heart Failure Clinic for follow up. She reports no medication access issues since enrollment in patient assistance for [place] She reports that her weight has decreased a few pounds recently. She reports no signs or symptoms of volume overload and adherence to fluid restriction. She reports no issues on current regimen.  Recent ED Visit (past 6 months):  Date: 06/12/2022, CC: SOB  Guideline-Directed Medical Therapy/Evidence Based Medicine ACE/ARB/ARNI: Sacubitril/valsartan 24/26 mg BID Beta Blocker:  none Aldosterone Antagonist: Spironolactone 25 mg daily Diuretic: Furosemide 40 mg daily as needed SGLT2i: Empagliflozin 10 mg daily  Adherence Assessment Do you ever forget to take your medication? [] Yes [x] No  Do you ever skip doses due to side effects? [] Yes [x] No  Do you have trouble affording your medicines? [] Yes [x] No  Are you ever unable to pick up your medication due to transportation difficulties? [] Yes [x] No  Do you ever stop taking your medications because you don't believe they are helping? [] Yes [x] No  Do you check your weight daily? [x] Yes [] No  Adherence strategy: none, husband helps her manage Barriers to obtaining medications: none reported  Diagnostics ECHO: Date 06/12/2022, EF 40-45%, no RWMA, G1DD  Vitals    11/15/2022   11:14 AM 11/14/2022    9:18 AM 11/05/2022    9:00 AM  Vitals with BMI  Height  5\' 3"  5\' 3"   Weight 111 lbs 3 oz 111 lbs 113 lbs  BMI 19.7 19.67 20.02  Systolic 127 122   Diastolic 67 78   Pulse 70 75      Recent Labs    Latest Ref Rng & Units 09/13/2022    9:56 AM 09/06/2022   10:46 AM 07/19/2022   11:13 AM  BMP  Glucose 70 - 99 mg/dL 93  98  86   BUN 8 - 23 mg/dL 24  19  21    Creatinine 0.44 - 1.00 mg/dL 5.40  9.81  1.91   Sodium 135 - 145 mmol/L 144  139  140   Potassium 3.5 - 5.1 mmol/L 3.9  3.3   3.9   Chloride 98 - 111 mmol/L 107  107  105   CO2 22 - 32 mmol/L 25  24  23    Calcium 8.9 - 10.3 mg/dL 9.2  8.7  9.1     Past Medical History Past Medical History:  Diagnosis Date   Anemia    CHF (congestive heart failure) (HCC)    COPD (chronic obstructive pulmonary disease) (HCC)    Dupuytren contracture    Hypothyroidism     Plan Continue regimen per NP Increase Entresto to 49/51 mg twice daily  Annual echo due 06/2023  Time spent: 10 minutes  Celene Squibb, PharmD PGY1 Pharmacy Resident 11/15/2022 12:05 PM

## 2022-11-15 NOTE — Progress Notes (Signed)
[id number], [name], MD (last seen 04/24) Primary Cardiologist:  HPI:  Ms Pena is a 72 y/o female with a history of COPD, thyroid disease, tobacco use and chronic heart failure.   Echo 06/12/22: EF of 40-45% along with mild/ moderate LVH and mild/ moderate MR/TR.   Admitted 06/12/22 due to COPD/ HF exacerbation.   She presents today for a HF follow-up visit with a chief complaint of minimal SOB with moderate exertion. Chronic in nature. Voices no other symptoms and says that she's feeling well. Has seen pulmonology since last here and on her annual CT scan, the nodule in her left lung has gotten bigger. She will be having a needle biopsy done but just has to get this set up.   ROS: All systems negative except as listed in HPI, PMH and Problem List.  SH:  Social History   Socioeconomic History   Marital status: Married    Spouse name: Ayshah Alva   Number of children: Not on file   Years of education: Not on file   Highest education level: Not on file  Occupational History   Not on file  Tobacco Use   Smoking status: Every Day    Packs/day: 1.00    Years: 53.00    Additional pack years: 0.00    Total pack years: 53.00    Types: Cigarettes   Smokeless tobacco: Never  Vaping Use   Vaping Use: Never used  Substance and Sexual Activity   Alcohol use: Yes    Alcohol/week: 6.0 standard drinks of alcohol    Types: 6 Glasses of wine per week    Comment: wine coolers   Drug use: Never   Sexual activity: Yes  Other Topics Concern   Not on file  Social History Narrative   Not on file   Social Determinants of Health   Financial Resource Strain: Not on file  Food Insecurity: No Food Insecurity (06/13/2022)   Hunger Vital Sign    Worried About Running Out of Food in the Last Year: Never true    Ran Out of Food in the Last Year: Never true  Transportation Needs: No Transportation Needs (06/13/2022)   PRAPARE - Administrator, Civil Service (Medical): No    Lack  of Transportation (Non-Medical): No  Physical Activity: Not on file  Stress: Not on file  Social Connections: Not on file  Intimate Partner Violence: Not At Risk (06/13/2022)   Humiliation, Afraid, Rape, and Kick questionnaire    Fear of Current or Ex-Partner: No    Emotionally Abused: No    Physically Abused: No    Sexually Abused: No    FH:  Family History  Problem Relation Age of Onset   Cervical cancer Mother    Thyroid disease Mother    Prostate cancer Father    Cerebral aneurysm Sister     Past Medical History:  Diagnosis Date   Anemia    CHF (congestive heart failure) (HCC)    COPD (chronic obstructive pulmonary disease) (HCC)    Dupuytren contracture    Hypothyroidism     Current Outpatient Medications  Medication Sig Dispense Refill   albuterol (PROVENTIL) (2.5 MG/3ML) 0.083% nebulizer solution Take 3 mLs (2.5 mg total) by nebulization every 4 (four) hours as needed for wheezing or shortness of breath. 75 mL 12   aspirin EC 81 MG tablet Take 1 tablet (81 mg total) by mouth daily. Swallow whole. 30 tablet 12   atorvastatin (LIPITOR) 40 MG tablet  Take 1 tablet (40 mg total) by mouth daily. 30 tablet 5   budesonide-formoterol (SYMBICORT) 80-4.5 MCG/ACT inhaler Inhale 2 puffs into the lungs 2 (two) times daily. 3 each 3   cyanocobalamin (VITAMIN B12) 1000 MCG tablet Take 1 tablet (1,000 mcg total) by mouth daily. 30 tablet 5   empagliflozin (JARDIANCE) 10 MG TABS tablet Take 1 tablet (10 mg total) by mouth daily before breakfast. 90 tablet 3   furosemide (LASIX) 40 MG tablet Take 1 tablet (40 mg total) by mouth daily. (Patient taking differently: Take 40 mg by mouth as needed for fluid.) 30 tablet 0   ibuprofen (ADVIL) 600 MG tablet Take 600 mg by mouth daily.     levothyroxine (SYNTHROID) 75 MCG tablet Take 75 mcg by mouth daily before breakfast.     metoprolol succinate (TOPROL XL) 25 MG 24 hr tablet Take 1 tablet (25 mg total) by mouth daily. 30 tablet 5    sacubitril-valsartan (ENTRESTO) 24-26 MG Take 1 tablet by mouth 2 (two) times daily. 180 tablet 3   spironolactone (ALDACTONE) 25 MG tablet Take 1 tablet (25 mg total) by mouth daily. 30 tablet 5   No current facility-administered medications for this visit.    Vitals:   11/15/22 1114  BP: 127/67  Pulse: 70  Temp: 97.8 F (36.6 C)  SpO2: 97%  Weight: 111 lb 3.2 oz (50.4 kg)   Lab Results  Component Value Date   CREATININE 0.68 09/13/2022   CREATININE 0.58 09/06/2022   CREATININE 0.60 07/19/2022   PHYSICAL EXAM:  General:  Well appearing. No resp difficulty HEENT: normal Neck: supple. JVP flat. No lymphadenopathy or thryomegaly appreciated. Cor: PMI normal. Regular rate & rhythm. No rubs, gallops or murmurs. Lungs: clear Abdomen: soft, nontender, nondistended. No hepatosplenomegaly. No bruits or masses.  Extremities: no cyanosis, clubbing, rash, edema Neuro: alert & oriented x3, cranial nerves grossly intact. Moves all 4 extremities w/o difficulty. Affect pleasant.   ECG: not done   ASSESSMENT & PLAN:   1: Chronic heart failure with reduced ejection fraction- - NYHA class II - euvolemic today - weighing daily; reminded to call for an overnight weight gain of > 2 pounds or a weekly weight gain of > 5 pounds - weight down 2 pounds from last visit here 1 month ago - adding "very little" salt and says that she's cut back a lot and is working on not adding any salt - drinking 3-4 glass of water and 2 cups of coffee and 1 wine cooler daily - continue jardiance 10mg  daily - titrate entresto to 49/51mg  BID; she can use what she has by taking 2 tablets BID until gone - continue metoprolol succinate 25mg  daily - continue spironolactone 25mg  daily - BMP next visit - BNP 06/12/22 was 1015.7 - PharmD reconciled meds w/ patient  2: COPD- - saw pulmonology (Dgayli) 05/24 - symbicort daily along with PRN abuterol - will be having needle biopsy of lung nodule done - BMP  10/11/22 showed sodium 142, potassium 4.3, creatinine 0.7 & GFR 91  3: Tobacco use- - has been smoking since she was 72 y/o - smoking 1 ppd cigarettes; has patches at home but says that she really doesn't want to quit smoking even though she knows she should  4: Anemia- - hemoglobin 10/11/22 was 12.5 - saw PCP Letitia Libra) 04/24  Return in 1 month, sooner if needed.

## 2022-11-15 NOTE — Patient Instructions (Signed)
Use you current entresto  bottle by taking 2 tablets in the morning and 2 tablets in the evening. When you get the new bottles in the mail, it will be the higher dose and you'll resume taking 1 tablet twice daily.

## 2022-11-20 ENCOUNTER — Telehealth: Payer: Self-pay

## 2022-11-20 ENCOUNTER — Ambulatory Visit: Payer: Medicare Other

## 2022-11-20 NOTE — Telephone Encounter (Signed)
Spoke to patient and made her aware of scheduled bx for 11/29/2022 at [facility] and CT 11/26/2022.  She is aware to arrive 3hr prior.  She voiced her understanding and had no further questions.  Nothing further needed.

## 2022-11-26 ENCOUNTER — Ambulatory Visit
Admission: RE | Admit: 2022-11-26 | Discharge: 2022-11-26 | Disposition: A | Discharge status: Home / Self Care | Payer: Medicare Other | Source: Ambulatory Visit | Attending: Student in an Organized Health Care Education/Training Program | Admitting: Student in an Organized Health Care Education/Training Program

## 2022-11-26 DIAGNOSIS — R911 Solitary pulmonary nodule: Secondary | ICD-10-CM | POA: Diagnosis present

## 2022-11-28 ENCOUNTER — Encounter (HOSPITAL_COMMUNITY): Payer: Self-pay | Admitting: Student in an Organized Health Care Education/Training Program

## 2022-11-28 ENCOUNTER — Other Ambulatory Visit: Payer: Self-pay

## 2022-11-28 NOTE — Anesthesia Preprocedure Evaluation (Addendum)
Anesthesia Evaluation  Patient identified by MRN, date of birth, ID band Patient awake    Reviewed: Allergy & Precautions, NPO status , Patient's Chart, lab work & pertinent test results  History of Anesthesia Complications Negative for: history of anesthetic complications  Airway Mallampati: II  TM Distance: >3 FB Neck ROM: Full    Dental  (+) Dental Advisory Given   Pulmonary COPD,  COPD inhaler, Current Smoker and Patient abstained from smoking.   Pulmonary exam normal breath sounds clear to auscultation- rhonchi (-) wheezing      Cardiovascular Exercise Tolerance: Good Pt. on home beta blockers +CHF  (-) CAD, (-) Past MI, (-) Cardiac Stents and (-) CABG Normal cardiovascular exam+ Valvular Problems/Murmurs (Mild-mod TR) MR  Rhythm:Regular Rate:Normal - Systolic murmurs and - Diastolic murmurs Echo 06/12/2022:  1. Left ventricular ejection fraction, by estimation, is 40 to 45%. The left ventricle has mildly decreased function. The left ventricle has no regional wall motion abnormalities. The left ventricular internal cavity size was mildly to moderately dilated. Left ventricular diastolic parameters are consistent with Grade I diastolic dysfunction (impaired relaxation).   2. Right ventricular systolic function is normal. The right ventricular size is normal.   3. The mitral valve is grossly normal. Mild to moderate mitral valve regurgitation.   4. Tricuspid valve regurgitation is mild to moderate. Mild to moderate tricuspid stenosis.   5. The aortic valve is normal in structure. Aortic valve regurgitation is trivial.     Neuro/Psych neg Seizures negative neurological ROS  negative psych ROS   GI/Hepatic negative GI ROS, Neg liver ROS,,,  Endo/Other  neg diabetesHypothyroidism    Renal/GU negative Renal ROS     Musculoskeletal  (+) Arthritis ,    Abdominal  (+) - obese  Peds  Hematology  (+) Blood dyscrasia,  anemia   Anesthesia Other Findings Past Medical History: No date: COPD (chronic obstructive pulmonary disease) (HCC) No date: Dupuytren contracture No date: Hypothyroidism   Reproductive/Obstetrics                             Anesthesia Physical Anesthesia Plan  ASA: 3  Anesthesia Plan: General   Post-op Pain Management:    Induction: Intravenous  PONV Risk Score and Plan: 2 and Propofol infusion, TIVA and Treatment may vary due to age or medical condition  Airway Management Planned: Oral ETT  Additional Equipment:   Intra-op Plan:   Post-operative Plan: Extubation in OR  Informed Consent: I have reviewed the patients History and Physical, chart, labs and discussed the procedure including the risks, benefits and alternatives for the proposed anesthesia with the patient or authorized representative who has indicated his/her understanding and acceptance.     Dental advisory given  Plan Discussed with: CRNA  Anesthesia Plan Comments: (PAT note by Antionette Poles, PA-C:  72 year old female with pertinent history including former smoker with associated COPD (not on O2), combined heart failure, hypothyroidism, chronic anemia.  She was admitted 12/13 through 06/15/2022 for acute respiratory failure secondary to COPD exacerbation.  She was treated with aggressive bronchodilator therapy, inhalational steroids, IV steroids, and subsequently with oral steroids.  She was initially on supplemental oxygen but was discharged on room air.  No evidence of infection, repeat CXR was stable.  Echocardiogram done during admission showed new systolic heart failure with EF 40 to 45% with grade 1 DD, mild to moderate mitral regurgitation, mild to moderate tricuspid regurgitation.  Cardiology was consulted and outpatient  follow-up was arranged.  She has subsequently been followed by cardiology APP Clarisa Kindred, FNP.  Last seen 11/15/2022 noted to be stable at that time, reported  minimal SOB with moderate exertion which is chronic in nature.  It was also discussed that she will be having a bronchoscopy for evaluation of enlarging left lung nodule.  Per note she continues to smoke 1 pack/day.  She is maintained on GDMT Jardiance, Entresto, metoprolol, spironolactone.  Patient will need day of surgery labs and evaluation.  EKG 06/12/2022: Sinus rhythm.  Rate 84.  Borderline prolonged QT interval.  CT chest lung cancer screening 11/05/2022: IMPRESSION: 1. Lung-RADS 4B, suspicious. Additional imaging evaluation or consultation with Pulmonology or Thoracic Surgery recommended. Enlarging left upper lobe pulmonary nodule of 7.3 mm. Potential clinical strategies include three-month follow-up diagnostic chest CT, PET (low end of resolution) or biopsy. 2. Aortic atherosclerosis (ICD10-I70.0), coronary artery atherosclerosis and emphysema (ICD10-J43.9). 3. These results will be called to the ordering clinician or representative by the Radiologist Assistant, and communication documented in the PACS or [facility] Dashboard  TTE 06/12/2022: 1. Left ventricular ejection fraction, by estimation, is 40 to 45%. The  left ventricle has mildly decreased function. The left ventricle has no  regional wall motion abnormalities. The left ventricular internal cavity  size was mildly to moderately  dilated. Left ventricular diastolic parameters are consistent with Grade I  diastolic dysfunction (impaired relaxation).  2. Right ventricular systolic function is normal. The right ventricular  size is normal.  3. The mitral valve is grossly normal. Mild to moderate mitral valve  regurgitation.  4. Tricuspid valve regurgitation is mild to moderate. Mild to moderate  tricuspid stenosis.  5. The aortic valve is normal in structure. Aortic valve regurgitation is  trivial.   )        Anesthesia Quick Evaluation

## 2022-11-28 NOTE — Progress Notes (Signed)
Spoke with pt for pre-op call. Pt states she has hx of CHF, denies HTN or Diabetes. She takes Jardiance for CHF. She states she was not instructed to hold it. Last dose today. Will not take tomorrow AM.   Shower instructions given to pt.  Covid test to be done DOS

## 2022-11-28 NOTE — Progress Notes (Signed)
Anesthesia Chart Review: Same-day workup  72 year old female with pertinent history including former smoker with associated COPD (not on O2), combined heart failure, hypothyroidism, chronic anemia.  She was admitted 12/13 through 06/15/2022 for acute respiratory failure secondary to COPD exacerbation.  She was treated with aggressive bronchodilator therapy, inhalational steroids, IV steroids, and subsequently with oral steroids.  She was initially on supplemental oxygen but was discharged on room air.  No evidence of infection, repeat CXR was stable.  Echocardiogram done during admission showed new systolic heart failure with EF 40 to 45% with grade 1 DD, mild to moderate mitral regurgitation, mild to moderate tricuspid regurgitation.  Cardiology was consulted and outpatient follow-up was arranged.  She has subsequently been followed by cardiology APP Clarisa Kindred, FNP.  Last seen 11/15/2022 noted to be stable at that time, reported minimal SOB with moderate exertion which is chronic in nature.  It was also discussed that she will be having a bronchoscopy for evaluation of enlarging left lung nodule.  Per note she continues to smoke 1 pack/day.  She is maintained on GDMT Jardiance, Entresto, metoprolol, spironolactone.  Patient will need day of surgery labs and evaluation.  EKG 06/12/2022: Sinus rhythm.  Rate 84.  Borderline prolonged QT interval.  CT chest lung cancer screening 11/05/2022: IMPRESSION: 1. Lung-RADS 4B, suspicious. Additional imaging evaluation or consultation with Pulmonology or Thoracic Surgery recommended. Enlarging left upper lobe pulmonary nodule of 7.3 mm. Potential clinical strategies include three-month follow-up diagnostic chest CT, PET (low end of resolution) or biopsy. 2. Aortic atherosclerosis (ICD10-I70.0), coronary artery atherosclerosis and emphysema (ICD10-J43.9). 3. These results will be called to the ordering clinician or representative by the Radiologist  Assistant, and communication documented in the PACS or [facility] Dashboard  TTE 06/12/2022:  1. Left ventricular ejection fraction, by estimation, is 40 to 45%. The  left ventricle has mildly decreased function. The left ventricle has no  regional wall motion abnormalities. The left ventricular internal cavity  size was mildly to moderately  dilated. Left ventricular diastolic parameters are consistent with Grade I  diastolic dysfunction (impaired relaxation).   2. Right ventricular systolic function is normal. The right ventricular  size is normal.   3. The mitral valve is grossly normal. Mild to moderate mitral valve  regurgitation.   4. Tricuspid valve regurgitation is mild to moderate. Mild to moderate  tricuspid stenosis.   5. The aortic valve is normal in structure. Aortic valve regurgitation is  trivial.      Zannie Cove [facility]/Anesthesiology Phone [phone] 11/28/2022 11:01 AM

## 2022-11-29 ENCOUNTER — Other Ambulatory Visit: Payer: Self-pay | Admitting: Student in an Organized Health Care Education/Training Program

## 2022-11-29 ENCOUNTER — Ambulatory Visit (HOSPITAL_BASED_OUTPATIENT_CLINIC_OR_DEPARTMENT_OTHER): Payer: Medicare Other | Admitting: Physician Assistant

## 2022-11-29 ENCOUNTER — Ambulatory Visit (HOSPITAL_COMMUNITY): Payer: Medicare Other | Admitting: Physician Assistant

## 2022-11-29 ENCOUNTER — Ambulatory Visit (HOSPITAL_COMMUNITY): Payer: Medicare Other

## 2022-11-29 ENCOUNTER — Other Ambulatory Visit: Payer: Self-pay

## 2022-11-29 ENCOUNTER — Encounter (HOSPITAL_COMMUNITY)
Admission: RE | Disposition: A | Discharge status: Home / Self Care | Payer: Self-pay | Source: Home / Self Care | Attending: Student in an Organized Health Care Education/Training Program

## 2022-11-29 ENCOUNTER — Encounter (HOSPITAL_COMMUNITY): Payer: Self-pay | Admitting: Student in an Organized Health Care Education/Training Program

## 2022-11-29 ENCOUNTER — Ambulatory Visit (HOSPITAL_COMMUNITY)
Admission: RE | Admit: 2022-11-29 | Discharge: 2022-11-29 | Disposition: A | Discharge status: Home / Self Care | Payer: Medicare Other | Attending: Student in an Organized Health Care Education/Training Program | Admitting: Student in an Organized Health Care Education/Training Program

## 2022-11-29 DIAGNOSIS — I5020 Unspecified systolic (congestive) heart failure: Secondary | ICD-10-CM | POA: Diagnosis not present

## 2022-11-29 DIAGNOSIS — Z79899 Other long term (current) drug therapy: Secondary | ICD-10-CM | POA: Diagnosis not present

## 2022-11-29 DIAGNOSIS — I081 Rheumatic disorders of both mitral and tricuspid valves: Secondary | ICD-10-CM | POA: Diagnosis not present

## 2022-11-29 DIAGNOSIS — J439 Emphysema, unspecified: Secondary | ICD-10-CM | POA: Insufficient documentation

## 2022-11-29 DIAGNOSIS — Z1152 Encounter for screening for COVID-19: Secondary | ICD-10-CM | POA: Diagnosis not present

## 2022-11-29 DIAGNOSIS — Z7984 Long term (current) use of oral hypoglycemic drugs: Secondary | ICD-10-CM | POA: Diagnosis not present

## 2022-11-29 DIAGNOSIS — F17210 Nicotine dependence, cigarettes, uncomplicated: Secondary | ICD-10-CM

## 2022-11-29 DIAGNOSIS — R911 Solitary pulmonary nodule: Secondary | ICD-10-CM

## 2022-11-29 DIAGNOSIS — E039 Hypothyroidism, unspecified: Secondary | ICD-10-CM | POA: Diagnosis not present

## 2022-11-29 DIAGNOSIS — J449 Chronic obstructive pulmonary disease, unspecified: Secondary | ICD-10-CM | POA: Insufficient documentation

## 2022-11-29 DIAGNOSIS — I700 Atherosclerosis of aorta: Secondary | ICD-10-CM | POA: Insufficient documentation

## 2022-11-29 DIAGNOSIS — C3412 Malignant neoplasm of upper lobe, left bronchus or lung: Secondary | ICD-10-CM

## 2022-11-29 DIAGNOSIS — D638 Anemia in other chronic diseases classified elsewhere: Secondary | ICD-10-CM

## 2022-11-29 DIAGNOSIS — I509 Heart failure, unspecified: Secondary | ICD-10-CM

## 2022-11-29 DIAGNOSIS — Z7951 Long term (current) use of inhaled steroids: Secondary | ICD-10-CM | POA: Diagnosis not present

## 2022-11-29 HISTORY — DX: Chronic cough: R05.3

## 2022-11-29 HISTORY — PX: BRONCHIAL NEEDLE ASPIRATION BIOPSY: SHX5106

## 2022-11-29 HISTORY — PX: ENDOBRONCHIAL ULTRASOUND: SHX5096

## 2022-11-29 HISTORY — DX: Unspecified osteoarthritis, unspecified site: M19.90

## 2022-11-29 HISTORY — PX: BRONCHIAL BIOPSY: SHX5109

## 2022-11-29 LAB — BASIC METABOLIC PANEL
Anion gap: 11 (ref 5–15)
BUN: 28 mg/dL — ABNORMAL HIGH (ref 8–23)
CO2: 20 mmol/L — ABNORMAL LOW (ref 22–32)
Calcium: 8.6 mg/dL — ABNORMAL LOW (ref 8.9–10.3)
Chloride: 104 mmol/L (ref 98–111)
Creatinine, Ser: 0.66 mg/dL (ref 0.44–1.00)
GFR, Estimated: >60 mL/min (ref >60)
Glucose, Bld: 87 mg/dL (ref 70–99)
Potassium: 3.1 mmol/L — ABNORMAL LOW (ref 3.5–5.1)
Sodium: 135 mmol/L (ref 135–145)

## 2022-11-29 LAB — CBC
HCT: 41.4 % (ref 36.0–46.0)
Hemoglobin: 13.2 g/dL (ref 12.0–15.0)
MCH: 28.7 pg (ref 26.0–34.0)
MCHC: 31.9 g/dL (ref 30.0–36.0)
MCV: 90 fL (ref 80.0–100.0)
Platelets: 304 10*3/uL (ref 150–400)
RBC: 4.6 MIL/uL (ref 3.87–5.11)
RDW: 14.6 % (ref 11.5–15.5)
WBC: 8.4 10*3/uL (ref 4.0–10.5)
nRBC: 0 % (ref 0.0–0.2)

## 2022-11-29 LAB — SARS CORONAVIRUS 2 BY RT PCR: SARS Coronavirus 2 by RT PCR: NEGATIVE

## 2022-11-29 SURGERY — BRONCHOSCOPY, WITH BIOPSY USING ELECTROMAGNETIC NAVIGATION
Anesthesia: General | Laterality: Bilateral

## 2022-11-29 MED ORDER — MEPERIDINE HCL 25 MG/ML IJ SOLN
6.2500 mg | INTRAMUSCULAR | Status: DC | PRN
  Filled 2022-11-29: qty 1

## 2022-11-29 MED ORDER — PHENYLEPHRINE HCL-NACL 20-0.9 MG/250ML-% IV SOLN
INTRAVENOUS | Status: DC | PRN
  Administered 2022-11-29: 25 ug/min via INTRAVENOUS

## 2022-11-29 MED ORDER — LACTATED RINGERS IV SOLN: INTRAVENOUS | Status: DC

## 2022-11-29 MED ORDER — AMISULPRIDE (ANTIEMETIC) 5 MG/2ML IV SOLN
10.0000 mg | Freq: Once | INTRAVENOUS | Status: DC | PRN
  Filled 2022-11-29: qty 4

## 2022-11-29 MED ORDER — PROPOFOL 10 MG/ML IV BOLUS
INTRAVENOUS | Status: DC | PRN
  Administered 2022-11-29: 100 mg via INTRAVENOUS

## 2022-11-29 MED ORDER — IPRATROPIUM-ALBUTEROL 0.5-2.5 (3) MG/3ML IN SOLN
RESPIRATORY_TRACT | Status: AC
  Filled 2022-11-29: qty 3

## 2022-11-29 MED ORDER — FENTANYL CITRATE (PF) 100 MCG/2ML IJ SOLN
INTRAMUSCULAR | Status: DC | PRN
  Administered 2022-11-29 (×2): 50 ug via INTRAVENOUS

## 2022-11-29 MED ORDER — LIDOCAINE 2% (20 MG/ML) 5 ML SYRINGE
INTRAMUSCULAR | Status: DC | PRN
  Administered 2022-11-29: 60 mg via INTRAVENOUS

## 2022-11-29 MED ORDER — CHLORHEXIDINE GLUCONATE 0.12 % MT SOLN: 15.0000 mL | Freq: Once | OROMUCOSAL | Status: AC

## 2022-11-29 MED ORDER — ACETAMINOPHEN 500 MG PO TABS
1000.0000 mg | ORAL_TABLET | Freq: Once | ORAL | Status: AC
  Administered 2022-11-29: 1000 mg via ORAL
  Filled 2022-11-29: qty 2

## 2022-11-29 MED ORDER — PROPOFOL 500 MG/50ML IV EMUL
INTRAVENOUS | Status: DC | PRN
  Administered 2022-11-29: 100 ug/kg/min via INTRAVENOUS

## 2022-11-29 MED ORDER — ROCURONIUM BROMIDE 10 MG/ML (PF) SYRINGE
PREFILLED_SYRINGE | INTRAVENOUS | Status: DC | PRN
  Administered 2022-11-29: 40 mg via INTRAVENOUS

## 2022-11-29 MED ORDER — DEXAMETHASONE SODIUM PHOSPHATE 10 MG/ML IJ SOLN
INTRAMUSCULAR | Status: DC | PRN
  Administered 2022-11-29: 10 mg via INTRAVENOUS

## 2022-11-29 MED ORDER — PHENYLEPHRINE 80 MCG/ML (10ML) SYRINGE FOR IV PUSH (FOR BLOOD PRESSURE SUPPORT)
PREFILLED_SYRINGE | INTRAVENOUS | Status: DC | PRN
  Administered 2022-11-29: 40 ug via INTRAVENOUS
  Administered 2022-11-29 (×2): 80 ug via INTRAVENOUS

## 2022-11-29 MED ORDER — FENTANYL CITRATE (PF) 100 MCG/2ML IJ SOLN: 25.0000 ug | INTRAMUSCULAR | Status: DC | PRN

## 2022-11-29 MED ORDER — IPRATROPIUM-ALBUTEROL 0.5-2.5 (3) MG/3ML IN SOLN: 3.0000 mL | RESPIRATORY_TRACT | Status: DC

## 2022-11-29 MED ORDER — IPRATROPIUM-ALBUTEROL 0.5-2.5 (3) MG/3ML IN SOLN
3.0000 mL | Freq: Once | RESPIRATORY_TRACT | Status: AC
  Administered 2022-11-29: 3 mL via RESPIRATORY_TRACT

## 2022-11-29 MED ORDER — ONDANSETRON HCL 4 MG/2ML IJ SOLN
INTRAMUSCULAR | Status: DC | PRN
  Administered 2022-11-29: 4 mg via INTRAVENOUS

## 2022-11-29 MED ORDER — CHLORHEXIDINE GLUCONATE 0.12 % MT SOLN
OROMUCOSAL | Status: AC
  Administered 2022-11-29: 15 mL via OROMUCOSAL
  Filled 2022-11-29: qty 15

## 2022-11-29 MED ORDER — SUGAMMADEX SODIUM 200 MG/2ML IV SOLN
INTRAVENOUS | Status: DC | PRN
  Administered 2022-11-29: 200 mg via INTRAVENOUS

## 2022-11-29 NOTE — Interval H&P Note (Signed)
S: Patient feels well, denies chest pain, cough, chest tightness, wheezing, or shortness of breath.  O: Vitals:   11/29/22 1008  BP: (!) 122/56  Pulse: 64  Resp: 16  Temp: 98.2 F (36.8 C)  SpO2: 99%   General: Well-appearing and in no distress. Well-nourished Eyes: Anicteric, no conjunctival pallor HEENT: Mucous membranes moist, no evidence of postnasal drip Respiratory: Trachea is midline, no respiratory distress, good bilateral air entry, no wheezes, rales, or rhonchi Cardiovascular: Heart with regular rate and rhythm, normal S1 and S2, no murmurs, rubs, or gallops Gastrointestinal: Normoactive bowel sounds, soft and nontender Neuro: Alert and oriented, no gross focal deficits  A/P:  72 year old female with a long standing history of smoking and emphysema presents with a LUL nodule that had grown on interval low dose CT for lung cancer screening. Patient's nodule is 8 mm in diameter. She is here for robotic assisted navigational bronchoscopy for nodule biopsy as well as EBUS. She is appropriate for the procedure and risks and benefits (include bleeding, pneumothorax, and non-diagnostic procedure) were explained to the patient in detail.  -proceed with robotic assisted navigational bronchoscopy and EBUS.  Raechel Chute, MD [facility] 11/29/2022 12:16 PM

## 2022-11-29 NOTE — Transfer of Care (Signed)
Immediate Anesthesia Transfer of Care Note  Patient: Denise Allen  Procedure(s) Performed: ROBOTIC ASSISTED NAVIGATIONAL BRONCHOSCOPY (Bilateral) BRONCHIAL BIOPSIES BRONCHIAL NEEDLE ASPIRATION BIOPSIES ENDOBRONCHIAL ULTRASOUND  Patient Location: PACU  Anesthesia Type:General  Level of Consciousness: awake, alert , and oriented  Airway & Oxygen Therapy: Patient Spontanous Breathing  Post-op Assessment: Report given to RN, Post -op Vital signs reviewed and stable, and Patient moving all extremities X 4  Post vital signs: Reviewed and stable  Last Vitals:  Vitals Value Taken Time  BP 114/57   Temp    Pulse 70 11/29/22 1337  Resp 15 11/29/22 1337  SpO2 96 % 11/29/22 1337  Vitals shown include unvalidated device data.  Last Pain:  Vitals:   11/29/22 1031  TempSrc:   PainSc: 0-No pain         Complications: No notable events documented.

## 2022-11-29 NOTE — Anesthesia Procedure Notes (Signed)
Procedure Name: Intubation Date/Time: 11/29/2022 12:32 PM  Performed by: Nils Pyle, CRNAPre-anesthesia Checklist: Patient identified, Emergency Drugs available, Suction available and Patient being monitored Patient Re-evaluated:Patient Re-evaluated prior to induction Oxygen Delivery Method: Circle System Utilized Preoxygenation: Pre-oxygenation with 100% oxygen Induction Type: IV induction Ventilation: Mask ventilation without difficulty Laryngoscope Size: Miller and 2 Grade View: Grade I Tube type: Oral Tube size: 8.5 mm Number of attempts: 1 Airway Equipment and Method: Stylet and Oral airway Placement Confirmation: ETT inserted through vocal cords under direct vision, positive ETCO2 and breath sounds checked- equal and bilateral Secured at: 22 cm Tube secured with: Tape Dental Injury: Teeth and Oropharynx as per pre-operative assessment

## 2022-11-29 NOTE — Op Note (Signed)
Video Bronchoscopy with Robotic Assisted Bronchoscopic Navigation   Date of Operation: 11/29/2022   Pre-op Diagnosis: lung nodule  Surgeon: Raechel Chute, MD  Anesthesia: General endotracheal anesthesia  Operation: Flexible video fiberoptic bronchoscopy with robotic assistance and biopsies.  Estimated Blood Loss: Minimal  Complications: None  Indications and History: Denise Allen is a 72 y.o. female with history of smoking and emphysema presenting for robotic assisted navigational bronchoscopy for a LUL nodule. The risks, benefits, complications, treatment options and expected outcomes were discussed with the patient.  The possibilities of pneumothorax, pneumonia, reaction to medication, pulmonary aspiration, perforation of a viscus, bleeding, failure to diagnose a condition and creating a complication requiring transfusion or operation were discussed with the patient who freely signed the consent.    Description of Procedure: The patient was seen in the Preoperative Area, was examined and was deemed appropriate to proceed.  The patient was taken to [facility] endoscopy room 3, identified as Denise Allen and the procedure verified as Flexible Video Fiberoptic Bronchoscopy.  A Time Out was held and the above information confirmed.   Prior to the date of the procedure a high-resolution CT scan of the chest was performed. Utilizing ION software program a virtual tracheobronchial tree was generated to allow the creation of distinct navigation pathways to the patient's parenchymal abnormalities. After being taken to the operating room general anesthesia was initiated and the patient  was orally intubated. The video fiberoptic bronchoscope was introduced via the endotracheal tube and a general inspection was performed which showed normal right and left lung anatomy, aspiration of the bilateral mainstems was completed to remove any remaining secretions. Robotic catheter inserted into patient's endotracheal  tube.   Target #1 LUL: The distinct navigation pathways prepared prior to this procedure were then utilized to navigate to patient's lesion identified on CT scan. The robotic catheter was secured into place and the vision probe was withdrawn.  Lesion location was approximated using fluoroscopy. We also utilized the CIOS cone beam system with a scan to adjust for CT to body divergence and confirm our location in the airway. Under fluoroscopic guidance transbronchial fine needle aspirates (FNA) and transbronchial forceps biopsies were performed to be sent for cytology.  At the end of the procedure the EBUS bronchoscope was introduced into the airway and the mediastinal and hilar lymph node stations were evaluated (11L, 4L, 7) and no enlarged lymph nodes were encountered. A general airway inspection was performed and there was no evidence of active bleeding. The bronchoscope was removed.  The patient tolerated the procedure well. There was no significant blood loss and there were no obvious complications. A post-procedural chest x-ray is pending.  Samples Target #1: 1. Transbronchial FNA from LUL 2. Transbronchial forceps biopsies from LUL  Plans:  The patient will be discharged from the PACU to home when recovered from anesthesia and after chest x-ray is reviewed. We will review the cytology, pathology and microbiology results with the patient when they become available. Outpatient followup will be with me.  Raechel Chute, MD [facility] 11/29/2022 1:29 PM

## 2022-11-29 NOTE — Anesthesia Postprocedure Evaluation (Signed)
Anesthesia Post Note  Patient: Denise Allen  Procedure(s) Performed: ROBOTIC ASSISTED NAVIGATIONAL BRONCHOSCOPY (Bilateral) BRONCHIAL BIOPSIES BRONCHIAL NEEDLE ASPIRATION BIOPSIES ENDOBRONCHIAL ULTRASOUND     Patient location during evaluation: PACU Anesthesia Type: General Level of consciousness: sedated and patient cooperative Pain management: pain level controlled Vital Signs Assessment: post-procedure vital signs reviewed and stable Respiratory status: spontaneous breathing Cardiovascular status: stable Anesthetic complications: no   No notable events documented.  Last Vitals:  Vitals:   11/29/22 1445 11/29/22 1500  BP: (!) 107/59 (!) 111/52  Pulse: 68 64  Resp: 18 15  Temp:  36.6 C  SpO2: 95% 95%    Last Pain:  Vitals:   11/29/22 1430  TempSrc:   PainSc: 0-No pain                 Lewie Loron

## 2022-12-02 ENCOUNTER — Encounter (HOSPITAL_COMMUNITY): Payer: Self-pay | Admitting: Student in an Organized Health Care Education/Training Program

## 2022-12-02 ENCOUNTER — Other Ambulatory Visit: Payer: Self-pay | Admitting: Student in an Organized Health Care Education/Training Program

## 2022-12-02 DIAGNOSIS — R911 Solitary pulmonary nodule: Secondary | ICD-10-CM

## 2022-12-02 NOTE — Progress Notes (Signed)
Denise Chute, MD  Ludwig Clarks, RN Cc: Rosaland Lao, CMA; Lilian Kapur PFT's ordered. Remy Dia and Synetta Fail - could we have the patient do the PFT's before her appointment with thoracic surgery.  Thanks! Habib   Patient is aware of need for PFT and voiced her understanding.  Nothing further needed.

## 2022-12-03 LAB — CYTOLOGY - NON PAP

## 2022-12-05 ENCOUNTER — Encounter: Payer: Self-pay | Admitting: *Deleted

## 2022-12-05 ENCOUNTER — Inpatient Hospital Stay: Payer: Medicare Other | Attending: Internal Medicine | Admitting: Internal Medicine

## 2022-12-05 ENCOUNTER — Inpatient Hospital Stay: Payer: Medicare Other

## 2022-12-05 ENCOUNTER — Encounter: Payer: Self-pay | Admitting: Internal Medicine

## 2022-12-05 VITALS — BP 127/79 | HR 74 | Temp 98.0°F | Ht 63.0 in | Wt 112.3 lb

## 2022-12-05 DIAGNOSIS — Z7951 Long term (current) use of inhaled steroids: Secondary | ICD-10-CM | POA: Diagnosis not present

## 2022-12-05 DIAGNOSIS — Z7984 Long term (current) use of oral hypoglycemic drugs: Secondary | ICD-10-CM | POA: Diagnosis not present

## 2022-12-05 DIAGNOSIS — F17210 Nicotine dependence, cigarettes, uncomplicated: Secondary | ICD-10-CM | POA: Diagnosis not present

## 2022-12-05 DIAGNOSIS — Z79899 Other long term (current) drug therapy: Secondary | ICD-10-CM | POA: Insufficient documentation

## 2022-12-05 DIAGNOSIS — C3412 Malignant neoplasm of upper lobe, left bronchus or lung: Secondary | ICD-10-CM | POA: Insufficient documentation

## 2022-12-05 DIAGNOSIS — Z7982 Long term (current) use of aspirin: Secondary | ICD-10-CM | POA: Diagnosis not present

## 2022-12-05 DIAGNOSIS — Z8049 Family history of malignant neoplasm of other genital organs: Secondary | ICD-10-CM | POA: Diagnosis not present

## 2022-12-05 DIAGNOSIS — Z8042 Family history of malignant neoplasm of prostate: Secondary | ICD-10-CM | POA: Insufficient documentation

## 2022-12-05 DIAGNOSIS — J449 Chronic obstructive pulmonary disease, unspecified: Secondary | ICD-10-CM | POA: Insufficient documentation

## 2022-12-05 NOTE — Progress Notes (Signed)
Met with patient during initial consult with Dr. Donneta Romberg to review recent biopsy results and treatment options. All questions answered during visit. Pt informed that will be called with her appts once scheduled. Contact info given and instructed to call with any questions or needs. Pt verbalized understanding.

## 2022-12-05 NOTE — Progress Notes (Signed)
[facility] CONSULT NOTE  Patient Care Team: Gracelyn Nurse, MD as PCP - General (Internal Medicine) Glory Buff, RN as Oncology Nurse Navigator  CHIEF COMPLAINTS/PURPOSE OF CONSULTATION: lung cancer  #  Oncology History Overview Note  IMPRESSION: 1. Solid 7 mm pulmonary nodule of the left upper lobe, unchanged in size when compared with the recent prior exam. 2. Severe coronary artery calcifications. 3. Aortic Atherosclerosis (ICD10-I70.0) and Emphysema (ICD10-J43.9).     Electronically Signed   By: Allegra Lai M.D.   On: 11/30/2022 18:21   Primary cancer of left upper lobe of lung (HCC)  12/05/2022 Initial Diagnosis   Primary cancer of left upper lobe of lung (HCC)   12/05/2022 Cancer Staging   Staging form: Lung, AJCC 8th Edition - Clinical: Stage IA1 (cT1a, cN0, cM0) - Signed by Earna Coder, MD on 12/05/2022      HISTORY OF PRESENTING ILLNESS: Patient ambulating-independently. Alone.  Denise Allen 72 y.o.  female history of smoking and COPD is here for further evaluation and recommendations for newly diagnosed lung cancer.   Patient noted to have abnormal left upper lobe lung nodule on screening CT scan.  Patient PET scan was declined.  This is for the followed by bronchoscopy and biopsy.   Chronic shortness of breath chronic cough.  Not any worse.  No headaches.  No weight loss.  No nausea no vomiting.   Review of Systems  Constitutional:  Positive for malaise/fatigue. Negative for chills, diaphoresis, fever and weight loss.  HENT:  Negative for nosebleeds and sore throat.   Eyes:  Negative for double vision.  Respiratory:  Positive for cough and shortness of breath. Negative for hemoptysis, sputum production and wheezing.   Cardiovascular:  Negative for chest pain, palpitations, orthopnea and leg swelling.  Gastrointestinal:  Negative for abdominal pain, blood in stool, constipation, diarrhea, heartburn, melena, nausea and vomiting.   Genitourinary:  Negative for dysuria, frequency and urgency.  Musculoskeletal:  Positive for joint pain. Negative for back pain.  Skin: Negative.  Negative for itching and rash.  Neurological:  Negative for dizziness, tingling, focal weakness, weakness and headaches.  Endo/Heme/Allergies:  Does not bruise/bleed easily.  Psychiatric/Behavioral:  Negative for depression. The patient is not nervous/anxious and does not have insomnia.      MEDICAL HISTORY:  Past Medical History:  Diagnosis Date   Anemia    Arthritis    CHF (congestive heart failure) (HCC) 05/2022   Chronic cough    COPD (chronic obstructive pulmonary disease) (HCC)    Dupuytren contracture    Hypothyroidism     SURGICAL HISTORY: Past Surgical History:  Procedure Laterality Date   BREAST EXCISIONAL BIOPSY Left 90s   benign   BRONCHIAL BIOPSY  11/29/2022   Procedure: BRONCHIAL BIOPSIES;  Surgeon: Raechel Chute, MD;  Location: [facility];  Service: Pulmonary;;   BRONCHIAL NEEDLE ASPIRATION BIOPSY  11/29/2022   Procedure: BRONCHIAL NEEDLE ASPIRATION BIOPSIES;  Surgeon: Raechel Chute, MD;  Location: [facility];  Service: Pulmonary;;   COLONOSCOPY  07/02/2007   COLONOSCOPY N/A 05/02/2021   Procedure: COLONOSCOPY;  Surgeon: Toledo, Boykin Nearing, MD;  Location: [facility];  Service: Gastroenterology;  Laterality: N/A;   ENDOBRONCHIAL ULTRASOUND  11/29/2022   Procedure: ENDOBRONCHIAL ULTRASOUND;  Surgeon: Raechel Chute, MD;  Location: [facility];  Service: Pulmonary;;   MASTECTOMY PARTIAL / LUMPECTOMY Left    benign   TUBAL LIGATION  1976    SOCIAL HISTORY: Social History   Socioeconomic History   Marital status:  Married    Spouse name: Halli Neighbours   Number of children: Not on file   Years of education: Not on file   Highest education level: Not on file  Occupational History   Not on file  Tobacco Use   Smoking status: Every Day    Packs/day: 1.00    Years: 53.00    Additional pack years: 0.00     Total pack years: 53.00    Types: Cigarettes    Passive exposure: Current   Smokeless tobacco: Never  Vaping Use   Vaping Use: Never used  Substance and Sexual Activity   Alcohol use: Yes    Alcohol/week: 6.0 standard drinks of alcohol    Types: 6 Glasses of wine per week    Comment: wine coolers   Drug use: Never   Sexual activity: Yes  Other Topics Concern   Not on file  Social History Narrative   Not on file   Social Determinants of Health   Financial Resource Strain: Not on file  Food Insecurity: No Food Insecurity (12/05/2022)   Hunger Vital Sign    Worried About Running Out of Food in the Last Year: Never true    Ran Out of Food in the Last Year: Never true  Transportation Needs: No Transportation Needs (12/05/2022)   PRAPARE - Administrator, Civil Service (Medical): No    Lack of Transportation (Non-Medical): No  Physical Activity: Not on file  Stress: Not on file  Social Connections: Not on file  Intimate Partner Violence: Not At Risk (12/05/2022)   Humiliation, Afraid, Rape, and Kick questionnaire    Fear of Current or Ex-Partner: No    Emotionally Abused: No    Physically Abused: No    Sexually Abused: No    FAMILY HISTORY: Family History  Problem Relation Age of Onset   Cervical cancer Mother    Thyroid disease Mother    Prostate cancer Father    Cerebral aneurysm Sister     ALLERGIES:  is allergic to penicillins.  MEDICATIONS:  Current Outpatient Medications  Medication Sig Dispense Refill   albuterol (PROVENTIL) (2.5 MG/3ML) 0.083% nebulizer solution Take 3 mLs (2.5 mg total) by nebulization every 4 (four) hours as needed for wheezing or shortness of breath. (Patient taking differently: Take 2.5 mg by nebulization every 4 (four) hours as needed for shortness of breath or wheezing (COPD).) 75 mL 12   albuterol (VENTOLIN HFA) 108 (90 Base) MCG/ACT inhaler Inhale 2 puffs into the lungs every 6 (six) hours as needed for wheezing or shortness of  breath (COPD).     aspirin EC 81 MG tablet Take 1 tablet (81 mg total) by mouth daily. Swallow whole. 30 tablet 12   aspirin-acetaminophen-caffeine (EXCEDRIN MIGRAINE) 250-250-65 MG tablet Take 2 tablets by mouth every 6 (six) hours as needed for headache.     atorvastatin (LIPITOR) 40 MG tablet Take 1 tablet (40 mg total) by mouth daily. 30 tablet 5   budesonide-formoterol (SYMBICORT) 80-4.5 MCG/ACT inhaler Inhale 2 puffs into the lungs 2 (two) times daily. (Patient taking differently: Inhale 2 puffs into the lungs at bedtime. Additional  2 puff in the morning if humid) 3 each 3   cyanocobalamin (VITAMIN B12) 1000 MCG tablet Take 1 tablet (1,000 mcg total) by mouth daily. 30 tablet 5   empagliflozin (JARDIANCE) 10 MG TABS tablet Take 1 tablet (10 mg total) by mouth daily before breakfast. 90 tablet 3   furosemide (LASIX) 40 MG tablet Take 1  tablet (40 mg total) by mouth daily. (Patient taking differently: Take 40 mg by mouth daily as needed for fluid.) 30 tablet 0   ibuprofen (ADVIL) 600 MG tablet Take 600 mg by mouth daily.     levothyroxine (SYNTHROID) 75 MCG tablet Take 75 mcg by mouth daily before breakfast.     metoprolol succinate (TOPROL XL) 25 MG 24 hr tablet Take 1 tablet (25 mg total) by mouth daily. 30 tablet 5   sacubitril-valsartan (ENTRESTO) 24-26 MG Take 2 tablets by mouth 2 (two) times daily.     spironolactone (ALDACTONE) 25 MG tablet Take 1 tablet (25 mg total) by mouth daily. 30 tablet 5   sacubitril-valsartan (ENTRESTO) 49-51 MG Take 1 tablet by mouth 2 (two) times daily. (Patient not taking: Reported on 12/05/2022) 180 tablet 3   No current facility-administered medications for this visit.      PHYSICAL EXAMINATION:   Vitals:   12/05/22 1042  BP: 127/79  Pulse: 74  Temp: 98 F (36.7 C)  SpO2: 99%   Filed Weights   12/05/22 1042  Weight: 112 lb 4.8 oz (50.9 kg)    Physical Exam Vitals and nursing note reviewed.  HENT:     Head: Normocephalic and atraumatic.      Mouth/Throat:     Pharynx: Oropharynx is clear.  Eyes:     Extraocular Movements: Extraocular movements intact.     Pupils: Pupils are equal, round, and reactive to light.  Cardiovascular:     Rate and Rhythm: Normal rate and regular rhythm.  Pulmonary:     Comments: Decreased breath sounds bilaterally.  Abdominal:     Palpations: Abdomen is soft.  Musculoskeletal:        General: Normal range of motion.     Cervical back: Normal range of motion.  Skin:    General: Skin is warm.  Neurological:     General: No focal deficit present.     Mental Status: She is alert and oriented to person, place, and time.  Psychiatric:        Behavior: Behavior normal.        Judgment: Judgment normal.      LABORATORY DATA:  I have reviewed the data as listed Lab Results  Component Value Date   WBC 8.4 11/29/2022   HGB 13.2 11/29/2022   HCT 41.4 11/29/2022   MCV 90.0 11/29/2022   PLT 304 11/29/2022   Recent Labs    06/12/22 0403 06/13/22 0451 06/15/22 0432 06/28/22 1339 09/06/22 1046 09/13/22 0956 11/29/22 0956  NA 140   < > 138   < > 139 144 135  K 2.9*   < > 4.8   < > 3.3* 3.9 3.1*  CL 107   < > 107   < > 107 107 104  CO2 24   < > 24   < > 24 25 20*  GLUCOSE 160*   < > 136*   < > 98 93 87  BUN 16   < > 36*   < > 19 24* 28*  CREATININE 0.77   < > 0.85   < > 0.58 0.68 0.66  CALCIUM 8.5*   < > 8.8*   < > 8.7* 9.2 8.6*  GFRNONAA >60   < > >60   < > >60 >60 >60  PROT 6.5  --  6.2*  --   --   --   --   ALBUMIN 3.5  --  3.4*  --   --   --   --  AST 38  --  23  --   --   --   --   ALT 21  --  22  --   --   --   --   ALKPHOS 72  --  60  --   --   --   --   BILITOT 0.9  --  0.4  --   --   --   --    < > = values in this interval not displayed.    RADIOGRAPHIC STUDIES: I have personally reviewed the radiological images as listed and agreed with the findings in the report. CT Super D Chest Wo Contrast  Result Date: 11/30/2022 CLINICAL DATA:  Pulmonary nodule. EXAM: CT CHEST  WITHOUT CONTRAST TECHNIQUE: Multidetector CT imaging of the chest was performed using thin slice collimation for electromagnetic bronchoscopy planning purposes, without intravenous contrast. RADIATION DOSE REDUCTION: This exam was performed according to the departmental dose-optimization program which includes automated exposure control, adjustment of the mA and/or kV according to patient size and/or use of iterative reconstruction technique. COMPARISON:  Lung cancer screening CT dated Nov 05, 2022 FINDINGS: Cardiovascular: Normal heart size. No pericardial effusion. Normal caliber thoracic aorta with moderate calcified plaque. Severe coronary artery calcifications. Mediastinum/Nodes: Small hiatal hernia. Thyroid is unremarkable. No enlarged lymph nodes seen in the chest. Lungs/Pleura: Central airways are patent. Mild biapical pleural-parenchymal scarring. Solid pulmonary nodule of the left upper lobe measuring 7 x 5 mm, unchanged in size when compared with the prior exam. Severe upper lung predominant centrilobular emphysema. No consolidation, pleural effusion or pneumothorax. Upper Abdomen: Partially visualized simple appearing cyst of the right kidney, no specific follow-up imaging is necessary. Nonobstructing right renal stone. Low-attenuation left adrenal gland nodule measuring 1.5 cm, consistent with benign adenoma, no specific follow-up imaging is necessary. Musculoskeletal: No chest wall mass or suspicious bone lesions identified. IMPRESSION: 1. Solid 7 mm pulmonary nodule of the left upper lobe, unchanged in size when compared with the recent prior exam. 2. Severe coronary artery calcifications. 3. Aortic Atherosclerosis (ICD10-I70.0) and Emphysema (ICD10-J43.9). Electronically Signed   By: Allegra Lai M.D.   On: 11/30/2022 18:21   DG Chest Port 1 View  Result Date: 11/29/2022 CLINICAL DATA:  Status post bronchoscopy, shortness of breath EXAM: PORTABLE CHEST 1 VIEW COMPARISON:  Chest radiograph done  on 06/14/2022, CT done on 11/26/2022 FINDINGS: Transverse diameter of heart is increased. There are no signs of pulmonary edema. Low position of diaphragms suggests COPD. Linear densities are seen in medial left lower lung field. There is minimal blunting of lateral CP angles. There is no pneumothorax. Small thin linear densities are seen in the apices, more so on the right side suggesting possible scarring. There is no pneumothorax. IMPRESSION: Linear densities in medial left lower lung fields suggest subsegmental atelectasis. Possible minimal bilateral pleural effusions. COPD. Electronically Signed   By: Ernie Avena M.D.   On: 11/29/2022 14:59   DG C-ARM BRONCHOSCOPY  Result Date: 11/29/2022 C-ARM BRONCHOSCOPY: Fluoroscopy was utilized by the requesting physician.  No radiographic interpretation.    ASSESSMENT & PLAN:   Primary cancer of left upper lobe of lung (HCC) June 2024- CT [screening] Solid 7 mm pulmonary nodule of the left upper lobe [s/p Bronch- Dr.Dgyali]- SQUAMOUS CELL CA- clinically stage 1. Recommend PET scan ASAP.    #Discussed that early stage cancers are treated with surgery.  Currently awaiting surgical evaluation with Dr.Lightfoot. Awaiting PFTs.   # Discussed as above early stage lung cancer treated  with surgery, however patient is considered not a good candidate for surgery [given age comorbidities] or declined surgery --next best option would be SBRT. Then we will make a referral to radiation oncology. Awaiting evaluation with thoracic surgery,    # Active smoker: Declined any smoking cessation counseling.  # COPD-clinically stable.  #Incidental findings on Imaging  CT June 2024: right kidney cysts; ;non-nobstructing right renal stone; left adrenal gland nodule measuring 1.5 cm, consistent with benign adenoma,Severe coronary artery calcifications. 3. Aortic Atherosclerosis and Emphysema (I reviewed/discussed/counseled the patient.   Thank you Dr.Dgyali for  allowing me to participate in the care of your pleasant patient. Please do not hesitate to contact me with questions or concerns in the interim.  # I reviewed the blood work- with the patient in detail; also reviewed the imaging independently [as summarized above]; and with the patient in detail.   # DISPOSITION: # no labs- # PET ASAP # follow up TBD- Dr.B   All questions were answered. The patient knows to call the clinic with any problems, questions or concerns.    Earna Coder, MD 12/05/2022 12:11 PM

## 2022-12-05 NOTE — Assessment & Plan Note (Addendum)
June 2024- CT [screening] Solid 7 mm pulmonary nodule of the left upper lobe [s/p Bronch- Dr.Dgyali]- SQUAMOUS CELL CA- clinically stage 1. Recommend PET scan ASAP.    #Discussed that early stage cancers are treated with surgery.  Currently awaiting surgical evaluation with Dr.Lightfoot. Awaiting PFTs.   # Discussed as above early stage lung cancer treated with surgery, however patient is considered not a good candidate for surgery [given age comorbidities] or declined surgery --next best option would be SBRT. Then we will make a referral to radiation oncology. Awaiting evaluation with thoracic surgery,    # Active smoker: Declined any smoking cessation counseling.  # COPD-clinically stable.  #Incidental findings on Imaging  CT June 2024: right kidney cysts; ;non-nobstructing right renal stone; left adrenal gland nodule measuring 1.5 cm, consistent with benign adenoma,Severe coronary artery calcifications. 3. Aortic Atherosclerosis and Emphysema (I reviewed/discussed/counseled the patient.   Thank you Dr.Dgyali for allowing me to participate in the care of your pleasant patient. Please do not hesitate to contact me with questions or concerns in the interim.  # I reviewed the blood work- with the patient in detail; also reviewed the imaging independently [as summarized above]; and with the patient in detail.   # DISPOSITION: # no labs- # PET ASAP # follow up TBD- Dr.B

## 2022-12-05 NOTE — Progress Notes (Signed)
Would like to discuss a plan of care. Is she going to have to have surgery?

## 2022-12-18 ENCOUNTER — Ambulatory Visit
Admission: RE | Admit: 2022-12-18 | Discharge: 2022-12-18 | Disposition: A | Discharge status: Home / Self Care | Payer: Medicare Other | Source: Ambulatory Visit | Attending: Internal Medicine | Admitting: Internal Medicine

## 2022-12-18 DIAGNOSIS — I2510 Atherosclerotic heart disease of native coronary artery without angina pectoris: Secondary | ICD-10-CM | POA: Diagnosis not present

## 2022-12-18 DIAGNOSIS — C3412 Malignant neoplasm of upper lobe, left bronchus or lung: Secondary | ICD-10-CM | POA: Insufficient documentation

## 2022-12-18 DIAGNOSIS — J439 Emphysema, unspecified: Secondary | ICD-10-CM | POA: Insufficient documentation

## 2022-12-18 DIAGNOSIS — I700 Atherosclerosis of aorta: Secondary | ICD-10-CM | POA: Diagnosis not present

## 2022-12-18 DIAGNOSIS — K8020 Calculus of gallbladder without cholecystitis without obstruction: Secondary | ICD-10-CM | POA: Insufficient documentation

## 2022-12-18 DIAGNOSIS — R911 Solitary pulmonary nodule: Secondary | ICD-10-CM | POA: Insufficient documentation

## 2022-12-18 DIAGNOSIS — N200 Calculus of kidney: Secondary | ICD-10-CM | POA: Insufficient documentation

## 2022-12-18 LAB — GLUCOSE, CAPILLARY: Glucose-Capillary: 72 mg/dL (ref 70–99)

## 2022-12-18 MED ORDER — FLUDEOXYGLUCOSE F - 18 (FDG) INJECTION
5.8000 | Freq: Once | INTRAVENOUS | Status: AC | PRN
  Administered 2022-12-18: 6.04 via INTRAVENOUS

## 2022-12-19 ENCOUNTER — Ambulatory Visit: Payer: Medicare Other | Attending: Student in an Organized Health Care Education/Training Program

## 2022-12-19 DIAGNOSIS — R911 Solitary pulmonary nodule: Secondary | ICD-10-CM | POA: Diagnosis not present

## 2022-12-19 DIAGNOSIS — R059 Cough, unspecified: Secondary | ICD-10-CM | POA: Diagnosis not present

## 2022-12-19 DIAGNOSIS — R0609 Other forms of dyspnea: Secondary | ICD-10-CM | POA: Insufficient documentation

## 2022-12-19 DIAGNOSIS — J449 Chronic obstructive pulmonary disease, unspecified: Secondary | ICD-10-CM | POA: Insufficient documentation

## 2022-12-19 DIAGNOSIS — F17210 Nicotine dependence, cigarettes, uncomplicated: Secondary | ICD-10-CM | POA: Diagnosis not present

## 2022-12-19 DIAGNOSIS — R942 Abnormal results of pulmonary function studies: Secondary | ICD-10-CM | POA: Diagnosis not present

## 2022-12-19 LAB — PULMONARY FUNCTION TEST ARMC ONLY
DL/VA % pred: 66 %
DL/VA: 2.74 ml/min/mmHg/L
DLCO unc % pred: 27 %
DLCO unc: 5.2 ml/min/mmHg
FEF 25-75 Post: 0.76 L/sec
FEF 25-75 Pre: 0.62 L/sec
FEF2575-%Change-Post: 22 %
FEF2575-%Pred-Post: 44 %
FEF2575-%Pred-Pre: 36 %
FEV1-%Change-Post: 8 %
FEV1-%Pred-Post: 66 %
FEV1-%Pred-Pre: 61 %
FEV1-Post: 1.37 L
FEV1-Pre: 1.27 L
FEV1FVC-%Change-Post: 0 %
FEV1FVC-%Pred-Pre: 78 %
FEV6-%Change-Post: 9 %
FEV6-%Pred-Post: 86 %
FEV6-%Pred-Pre: 78 %
FEV6-Post: 2.27 L
FEV6-Pre: 2.08 L
FEV6FVC-%Change-Post: 0 %
FEV6FVC-%Pred-Post: 104 %
FEV6FVC-%Pred-Pre: 105 %
FVC-%Change-Post: 7 %
FVC-%Pred-Post: 82 %
FVC-%Pred-Pre: 77 %
FVC-Post: 2.29 L
FVC-Pre: 2.14 L
Post FEV1/FVC ratio: 60 %
Post FEV6/FVC ratio: 99 %
Pre FEV1/FVC ratio: 59 %
Pre FEV6/FVC Ratio: 100 %
RV % pred: 138 %
RV: 3.05 L
TLC % pred: 99 %
TLC: 4.9 L

## 2022-12-19 MED ORDER — ALBUTEROL SULFATE (2.5 MG/3ML) 0.083% IN NEBU
2.5000 mg | INHALATION_SOLUTION | Freq: Once | RESPIRATORY_TRACT | Status: AC
  Filled 2022-12-19: qty 3

## 2022-12-24 NOTE — Progress Notes (Unsigned)
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   Echo 06/12/22: EF of 40-45% along with mild/ moderate LVH and mild/ moderate MR/TR.   Admitted 06/12/22 due to COPD/ HF exacerbation.   She presents today for a HF follow-up visit with a chief complaint of minimal SOB with moderate exertion. Chronic in nature. Voices no other symptoms and says that she's feeling well. Has seen pulmonology since last here and on her annual CT scan, the nodule in her left lung has gotten bigger. She will be having a needle biopsy done but just has to get this set up.   ROS: All systems negative except as listed in HPI, PMH and Problem List.  SH:  Social History   Socioeconomic History   Marital status: Married    Spouse name: Denise Allen   Number of children: Not on file   Years of education: Not on file   Highest education level: Not on file  Occupational History   Not on file  Tobacco Use   Smoking status: Every Day    Packs/day: 1.00    Years: 53.00    Additional pack years: 0.00    Total pack years: 53.00    Types: Cigarettes    Passive exposure: Current   Smokeless tobacco: Never  Vaping Use   Vaping Use: Never used  Substance and Sexual Activity   Alcohol use: Yes    Alcohol/week: 6.0 standard drinks of alcohol    Types: 6 Glasses of wine per week    Comment: wine coolers   Drug use: Never   Sexual activity: Yes  Other Topics Concern   Not on file  Social History Narrative   Not on file   Social Determinants of Health   Financial Resource Strain: Not on file  Food Insecurity: No Food Insecurity (12/05/2022)   Hunger Vital Sign    Worried About Running Out of Food in the Last Year: Never true    Ran Out of Food in the Last Year: Never true  Transportation Needs: No Transportation Needs (12/05/2022)   PRAPARE - Scientist, research (physical sciences) (Medical): No    Lack of Transportation (Non-Medical): No  Physical Activity: Not on file  Stress: Not on file  Social Connections: Not on file  Intimate Partner Violence: Not At Risk (12/05/2022)   Humiliation, Afraid, Rape, and Kick questionnaire    Fear of Current or Ex-Partner: No    Emotionally Abused: No    Physically Abused: No    Sexually Abused: No    FH:  Family History  Problem Relation Age of Onset   Cervical cancer Mother    Thyroid disease Mother    Prostate cancer Father    Cerebral aneurysm Sister     Past Medical History:  Diagnosis Date   Anemia    Arthritis    CHF (congestive heart failure) (HCC) 05/2022   Chronic cough    COPD (chronic obstructive pulmonary disease) (HCC)    Dupuytren contracture    Hypothyroidism     Current Outpatient Medications  Medication Sig Dispense Refill   albuterol (PROVENTIL) (2.5 MG/3ML) 0.083% nebulizer solution Take 3 mLs (2.5 mg total) by nebulization every 4 (four) hours as needed for wheezing or shortness of breath. (Patient taking differently: Take 2.5 mg by nebulization every 4 (four) hours as needed for shortness  of breath or wheezing (COPD).) 75 mL 12   albuterol (VENTOLIN HFA) 108 (90 Base) MCG/ACT inhaler Inhale 2 puffs into the lungs every 6 (six) hours as needed for wheezing or shortness of breath (COPD).     aspirin EC 81 MG tablet Take 1 tablet (81 mg total) by mouth daily. Swallow whole. 30 tablet 12   aspirin-acetaminophen-caffeine (EXCEDRIN MIGRAINE) 250-250-65 MG tablet Take 2 tablets by mouth every 6 (six) hours as needed for headache.     atorvastatin (LIPITOR) 40 MG tablet Take 1 tablet (40 mg total) by mouth daily. 30 tablet 5   budesonide-formoterol (SYMBICORT) 80-4.5 MCG/ACT inhaler Inhale 2 puffs into the lungs 2 (two) times daily. (Patient taking differently: Inhale 2 puffs into the lungs at bedtime. Additional  2 puff in the morning if humid) 3 each 3   cyanocobalamin (VITAMIN B12) 1000  MCG tablet Take 1 tablet (1,000 mcg total) by mouth daily. 30 tablet 5   empagliflozin (JARDIANCE) 10 MG TABS tablet Take 1 tablet (10 mg total) by mouth daily before breakfast. 90 tablet 3   furosemide (LASIX) 40 MG tablet Take 1 tablet (40 mg total) by mouth daily. (Patient taking differently: Take 40 mg by mouth daily as needed for fluid.) 30 tablet 0   ibuprofen (ADVIL) 600 MG tablet Take 600 mg by mouth daily.     levothyroxine (SYNTHROID) 75 MCG tablet Take 75 mcg by mouth daily before breakfast.     metoprolol succinate (TOPROL XL) 25 MG 24 hr tablet Take 1 tablet (25 mg total) by mouth daily. 30 tablet 5   sacubitril-valsartan (ENTRESTO) 24-26 MG Take 2 tablets by mouth 2 (two) times daily.     sacubitril-valsartan (ENTRESTO) 49-51 MG Take 1 tablet by mouth 2 (two) times daily. (Patient not taking: Reported on 12/05/2022) 180 tablet 3   spironolactone (ALDACTONE) 25 MG tablet Take 1 tablet (25 mg total) by mouth daily. 30 tablet 5   No current facility-administered medications for this visit.   Facility-Administered Medications Ordered in Other Visits  Medication Dose Route Frequency Provider Last Rate Last Admin   albuterol (PROVENTIL) (2.5 MG/3ML) 0.083% nebulizer solution 2.5 mg  2.5 mg Nebulization Once Raechel Chute, MD        There were no vitals filed for this visit.  Lab Results  Component Value Date   CREATININE 0.66 11/29/2022   CREATININE 0.68 09/13/2022   CREATININE 0.58 09/06/2022   PHYSICAL EXAM:  General:  Well appearing. No resp difficulty HEENT: normal Neck: supple. JVP flat. No lymphadenopathy or thryomegaly appreciated. Cor: PMI normal. Regular rate & rhythm. No rubs, gallops or murmurs. Lungs: clear Abdomen: soft, nontender, nondistended. No hepatosplenomegaly. No bruits or masses.  Extremities: no cyanosis, clubbing, rash, edema Neuro: alert & oriented x3, cranial nerves grossly intact. Moves all 4 extremities w/o difficulty. Affect pleasant.   ECG:  not done   ASSESSMENT & PLAN:   1: Chronic heart failure with reduced ejection fraction- - NYHA class II - euvolemic today - weighing daily; reminded to call for an overnight weight gain of > 2 pounds or a weekly weight gain of > 5 pounds - weight down 2 pounds from last visit here 1 month ago - adding "very little" salt and says that she's cut back a lot and is working on not adding any salt - drinking 3-4 glass of water and 2 cups of coffee and 1 wine cooler daily - continue jardiance 10mg  daily - titrate entresto to 49/51mg  BID; she can  use what she has by taking 2 tablets BID until gone - continue metoprolol succinate 25mg  daily - continue spironolactone 25mg  daily - BMP next visit - BNP 06/12/22 was 1015.7 - PharmD reconciled meds w/ patient  2: COPD- - saw pulmonology (Dgayli) 05/24 - symbicort daily along with PRN abuterol - will be having needle biopsy of lung nodule done - BMP 10/11/22 showed sodium 142, potassium 4.3, creatinine 0.7 & GFR 91  3: Tobacco use- - has been smoking since she was 72 y/o - smoking 1 ppd cigarettes; has patches at home but says that she really doesn't want to quit smoking even though she knows she should  4: Anemia- - hemoglobin 10/11/22 was 12.5 - saw PCP Letitia Libra) 04/24  Return in 1 month, sooner if needed.

## 2022-12-25 ENCOUNTER — Ambulatory Visit (HOSPITAL_BASED_OUTPATIENT_CLINIC_OR_DEPARTMENT_OTHER): Payer: Medicare Other | Admitting: Family

## 2022-12-25 ENCOUNTER — Encounter: Payer: Self-pay | Admitting: Family

## 2022-12-25 ENCOUNTER — Other Ambulatory Visit
Admission: RE | Admit: 2022-12-25 | Discharge: 2022-12-25 | Disposition: A | Discharge status: Home / Self Care | Payer: Medicare Other | Source: Ambulatory Visit | Attending: Family | Admitting: Family

## 2022-12-25 VITALS — BP 130/63 | HR 73 | Wt 116.2 lb

## 2022-12-25 DIAGNOSIS — J449 Chronic obstructive pulmonary disease, unspecified: Secondary | ICD-10-CM

## 2022-12-25 DIAGNOSIS — Z720 Tobacco use: Secondary | ICD-10-CM

## 2022-12-25 DIAGNOSIS — I5022 Chronic systolic (congestive) heart failure: Secondary | ICD-10-CM | POA: Insufficient documentation

## 2022-12-25 DIAGNOSIS — D649 Anemia, unspecified: Secondary | ICD-10-CM | POA: Diagnosis not present

## 2022-12-25 DIAGNOSIS — C3412 Malignant neoplasm of upper lobe, left bronchus or lung: Secondary | ICD-10-CM

## 2022-12-25 LAB — BASIC METABOLIC PANEL
Anion gap: 10 (ref 5–15)
BUN: 29 mg/dL — ABNORMAL HIGH (ref 8–23)
CO2: 23 mmol/L (ref 22–32)
Calcium: 8.4 mg/dL — ABNORMAL LOW (ref 8.9–10.3)
Chloride: 106 mmol/L (ref 98–111)
Creatinine, Ser: 0.7 mg/dL (ref 0.44–1.00)
GFR, Estimated: >60 mL/min (ref >60)
Glucose, Bld: 91 mg/dL (ref 70–99)
Potassium: 3.7 mmol/L (ref 3.5–5.1)
Sodium: 139 mmol/L (ref 135–145)

## 2022-12-25 NOTE — Patient Instructions (Signed)
Go to [facility] to get your lab work drawn.    If you receive a satisfaction survey regarding the [facility], please take the time to fill it out. This way we can continue to provide excellent care and make any changes that need to be made.    

## 2022-12-27 ENCOUNTER — Institutional Professional Consult (permissible substitution): Payer: Medicare Other | Admitting: Thoracic Surgery (Cardiothoracic Vascular Surgery)

## 2022-12-27 VITALS — BP 146/77 | HR 77 | Resp 18 | Ht 63.0 in | Wt 111.0 lb

## 2022-12-27 DIAGNOSIS — R911 Solitary pulmonary nodule: Secondary | ICD-10-CM

## 2022-12-27 NOTE — Progress Notes (Signed)
[address]       [place] [address]             [phone]                    [facility] Medical Record #[id number] Date of Birth: Dec 23, 1950  Referring: Raechel Chute, MD Primary Care: Gracelyn Nurse, MD Primary Cardiologist: None  Chief Complaint:    Chief Complaint  Patient presents with   Lung Lesion    CT LCS 5/7, super d 5/28, nav 5/31, PET 6/19, PFT 6/20    History of Present Illness:    Denise Allen 72 y.o. female presents for surgical evaluation of a biopsy-proven left upper lobe non-small cell lung cancer.  She is a current smoker, and this was identified lung cancer screening.        Past Medical History:  Diagnosis Date   Anemia    Arthritis    CHF (congestive heart failure) (HCC) 05/2022   Chronic cough    COPD (chronic obstructive pulmonary disease) (HCC)    Dupuytren contracture    Hypothyroidism     Past Surgical History:  Procedure Laterality Date   BREAST EXCISIONAL BIOPSY Left 90s   benign   BRONCHIAL BIOPSY  11/29/2022   Procedure: BRONCHIAL BIOPSIES;  Surgeon: Raechel Chute, MD;  Location: [facility];  Service: Pulmonary;;   BRONCHIAL NEEDLE ASPIRATION BIOPSY  11/29/2022   Procedure: BRONCHIAL NEEDLE ASPIRATION BIOPSIES;  Surgeon: Raechel Chute, MD;  Location: [facility];  Service: Pulmonary;;   COLONOSCOPY  07/02/2007   COLONOSCOPY N/A 05/02/2021   Procedure: COLONOSCOPY;  Surgeon: Toledo, Boykin Nearing, MD;  Location: [facility];  Service: Gastroenterology;  Laterality: N/A;   ENDOBRONCHIAL ULTRASOUND  11/29/2022   Procedure: ENDOBRONCHIAL ULTRASOUND;  Surgeon: Raechel Chute, MD;  Location: [facility];  Service: Pulmonary;;   MASTECTOMY PARTIAL / LUMPECTOMY Left    benign   TUBAL LIGATION  1976    Family History  Problem Relation Age of Onset   Cervical cancer Mother    Thyroid disease Mother    Prostate cancer Father    Cerebral aneurysm Sister      Social History   Tobacco Use   Smoking Status Every Day   Packs/day: 1.00   Years: 53.00   Additional pack years: 0.00   Total pack years: 53.00   Types: Cigarettes   Passive exposure: Current  Smokeless Tobacco Never    Social History   Substance and Sexual Activity  Alcohol Use Yes   Alcohol/week: 6.0 standard drinks of alcohol   Types: 6 Glasses of wine per week   Comment: wine coolers     Allergies  Allergen Reactions   Penicillins Other (See Comments)    Patient states "NOT ALLERGIC" causes severe yeast infection. Prefers NOT to use it.    Current Outpatient Medications  Medication Sig Dispense Refill   albuterol (PROVENTIL) (2.5 MG/3ML) 0.083% nebulizer solution Take 3 mLs (2.5 mg total) by nebulization every 4 (four) hours as needed for wheezing or shortness of breath. 75 mL 12   albuterol (VENTOLIN HFA) 108 (90 Base) MCG/ACT inhaler Inhale 2 puffs into the lungs every 6 (six) hours as needed for wheezing or shortness of breath (COPD).     aspirin EC 81 MG tablet Take 1 tablet (81 mg total) by mouth daily. Swallow whole. 30 tablet 12   atorvastatin (LIPITOR) 40 MG tablet Take 1 tablet (40 mg total)  by mouth daily. 30 tablet 5   budesonide-formoterol (SYMBICORT) 80-4.5 MCG/ACT inhaler Inhale 2 puffs into the lungs 2 (two) times daily. (Patient taking differently: Inhale 2 puffs into the lungs at bedtime. Additional  2 puff in the morning if humid) 3 each 3   cyanocobalamin (VITAMIN B12) 1000 MCG tablet Take 1 tablet (1,000 mcg total) by mouth daily. 30 tablet 5   empagliflozin (JARDIANCE) 10 MG TABS tablet Take 1 tablet (10 mg total) by mouth daily before breakfast. 90 tablet 3   furosemide (LASIX) 40 MG tablet Take 1 tablet (40 mg total) by mouth daily. (Patient taking differently: Take 40 mg by mouth daily as needed for fluid.) 30 tablet 0   levothyroxine (SYNTHROID) 75 MCG tablet Take 75 mcg by mouth daily before breakfast.     metoprolol succinate (TOPROL XL) 25 MG 24 hr tablet Take 1 tablet (25 mg  total) by mouth daily. 30 tablet 5   sacubitril-valsartan (ENTRESTO) 24-26 MG Take 2 tablets by mouth 2 (two) times daily.     sacubitril-valsartan (ENTRESTO) 49-51 MG Take 1 tablet by mouth 2 (two) times daily. 180 tablet 3   spironolactone (ALDACTONE) 25 MG tablet Take 1 tablet (25 mg total) by mouth daily. 30 tablet 5   No current facility-administered medications for this visit.   Facility-Administered Medications Ordered in Other Visits  Medication Dose Route Frequency Provider Last Rate Last Admin   albuterol (PROVENTIL) (2.5 MG/3ML) 0.083% nebulizer solution 2.5 mg  2.5 mg Nebulization Once Raechel Chute, MD        Review of Systems  Respiratory:  Positive for cough and shortness of breath.      PHYSICAL EXAMINATION: BP (!) 146/77 (BP Location: Left Arm, Patient Position: Sitting)   Pulse 77   Resp 18   Ht 5\' 3"  (1.6 m)   Wt 111 lb (50.3 kg)   SpO2 92% Comment: RA  BMI 19.66 kg/m  Physical Exam Constitutional:      General: She is not in acute distress.    Appearance: She is ill-appearing.  Cardiovascular:     Rate and Rhythm: Normal rate.  Pulmonary:     Effort: Pulmonary effort is normal. No respiratory distress.  Musculoskeletal:     Cervical back: Normal range of motion.  Skin:    General: Skin is warm and dry.  Neurological:     General: No focal deficit present.     Mental Status: She is alert and oriented to person, place, and time.          I have independently reviewed the above radiology studies  and reviewed the findings with the patient.   Recent Lab Findings: Lab Results  Component Value Date   WBC 8.4 11/29/2022   HGB 13.2 11/29/2022   HCT 41.4 11/29/2022   PLT 304 11/29/2022   GLUCOSE 91 12/25/2022   CHOL 170 06/13/2022   TRIG 76 06/13/2022   HDL 43 06/13/2022   LDLCALC 112 (H) 06/13/2022   ALT 22 06/15/2022   AST 23 06/15/2022   NA 139 12/25/2022   K 3.7 12/25/2022   CL 106 12/25/2022   CREATININE 0.70 12/25/2022   BUN 29 (H)  12/25/2022   CO2 23 12/25/2022   INR 1.2 06/12/2022   HGBA1C 5.4 06/12/2022    Diagnostic Studies & Laboratory data:     Recent Radiology Findings:   NM PET Image Initial (PI) Skull Base To Thigh (F-18 FDG)  Result Date: 12/23/2022 CLINICAL DATA:  Initial treatment strategy  for non-small cell lung cancer. EXAM: NUCLEAR MEDICINE PET SKULL BASE TO THIGH TECHNIQUE: 6.0 mCi F-18 FDG was injected intravenously. Full-ring PET imaging was performed from the skull base to thigh after the radiotracer. CT data was obtained and used for attenuation correction and anatomic localization. Fasting blood glucose: 72 mg/dl COMPARISON:  CT chest 16/04/9603. FINDINGS: Mediastinal blood pool activity: SUV max 2.0 Liver activity: SUV max NA NECK: No abnormal hypermetabolism. Incidental CT findings: None. CHEST: Borderline hypermetabolic 5 mm left upper lobe nodule (4/49), SUV max 1.9. No additional abnormal hypermetabolism. Incidental CT findings: Atherosclerotic calcification of the aorta, aortic valve and coronary arteries. Heart is enlarged. No pericardial or pleural effusion. Centrilobular emphysema. ABDOMEN/PELVIS: No abnormal hypermetabolism. Incidental CT findings: Liver is grossly unremarkable. Gallstones. Adrenal glands are unremarkable. Right renal cyst. Right renal stones. Kidneys, spleen, pancreas, stomach and bowel are otherwise unremarkable. SKELETON: No abnormal hypermetabolism. Incidental CT findings: Possible enchondroma in the right ischium. IMPRESSION: 1. Borderline hypermetabolic left upper lobe nodule, worrisome for bronchogenic carcinoma. 2. Cholelithiasis. 3. Right renal stones. 4. Aortic atherosclerosis (ICD10-I70.0). Coronary artery calcification. 5.  Emphysema (ICD10-J43.9). Electronically Signed   By: Leanna Battles M.D.   On: 12/23/2022 10:46   DG Chest Port 1 View  Result Date: 11/29/2022 CLINICAL DATA:  Status post bronchoscopy, shortness of breath EXAM: PORTABLE CHEST 1 VIEW COMPARISON:   Chest radiograph done on 06/14/2022, CT done on 11/26/2022 FINDINGS: Transverse diameter of heart is increased. There are no signs of pulmonary edema. Low position of diaphragms suggests COPD. Linear densities are seen in medial left lower lung field. There is minimal blunting of lateral CP angles. There is no pneumothorax. Small thin linear densities are seen in the apices, more so on the right side suggesting possible scarring. There is no pneumothorax. IMPRESSION: Linear densities in medial left lower lung fields suggest subsegmental atelectasis. Possible minimal bilateral pleural effusions. COPD. Electronically Signed   By: Ernie Avena M.D.   On: 11/29/2022 14:59   DG C-ARM BRONCHOSCOPY  Result Date: 11/29/2022 C-ARM BRONCHOSCOPY: Fluoroscopy was utilized by the requesting physician.  No radiographic interpretation.     PFTs:  -DLCO: 27%    Assessment / Plan:   72 year old female with a left upper lobe non-small cell lung cancer.  Unfortunately based off of her pulmonary function testing she would not be a candidate for any surgical resection.  She also continues to smoke.  I have referred her back to her radiation oncologist.     I  spent 40 minutes with  the patient face to face in counseling and coordination of care.    Corliss Skains 12/27/2022 3:05 PM

## 2022-12-30 ENCOUNTER — Encounter: Payer: Self-pay | Admitting: *Deleted

## 2023-01-06 ENCOUNTER — Telehealth: Payer: Self-pay

## 2023-01-06 NOTE — Telephone Encounter (Signed)
Erroneous encounter, please disregard

## 2023-01-08 ENCOUNTER — Encounter: Payer: Self-pay | Admitting: Radiation Oncology

## 2023-01-08 ENCOUNTER — Ambulatory Visit
Admission: RE | Admit: 2023-01-08 | Discharge: 2023-01-08 | Disposition: A | Discharge status: Home / Self Care | Payer: Medicare Other | Source: Ambulatory Visit | Attending: Radiation Oncology | Admitting: Radiation Oncology

## 2023-01-08 ENCOUNTER — Other Ambulatory Visit: Payer: Self-pay | Admitting: Internal Medicine

## 2023-01-08 VITALS — BP 143/79 | HR 74 | Temp 97.6°F | Resp 20 | Ht 63.0 in | Wt 111.9 lb

## 2023-01-08 DIAGNOSIS — Z7982 Long term (current) use of aspirin: Secondary | ICD-10-CM | POA: Diagnosis not present

## 2023-01-08 DIAGNOSIS — C3412 Malignant neoplasm of upper lobe, left bronchus or lung: Secondary | ICD-10-CM | POA: Diagnosis present

## 2023-01-08 DIAGNOSIS — Z79899 Other long term (current) drug therapy: Secondary | ICD-10-CM | POA: Insufficient documentation

## 2023-01-08 DIAGNOSIS — Z510 Encounter for antineoplastic radiation therapy: Secondary | ICD-10-CM | POA: Insufficient documentation

## 2023-01-08 DIAGNOSIS — I509 Heart failure, unspecified: Secondary | ICD-10-CM | POA: Insufficient documentation

## 2023-01-08 DIAGNOSIS — Z7984 Long term (current) use of oral hypoglycemic drugs: Secondary | ICD-10-CM | POA: Diagnosis not present

## 2023-01-08 DIAGNOSIS — Z8042 Family history of malignant neoplasm of prostate: Secondary | ICD-10-CM | POA: Insufficient documentation

## 2023-01-08 DIAGNOSIS — J449 Chronic obstructive pulmonary disease, unspecified: Secondary | ICD-10-CM | POA: Diagnosis not present

## 2023-01-08 DIAGNOSIS — Z79890 Hormone replacement therapy (postmenopausal): Secondary | ICD-10-CM | POA: Diagnosis not present

## 2023-01-08 DIAGNOSIS — F17210 Nicotine dependence, cigarettes, uncomplicated: Secondary | ICD-10-CM | POA: Insufficient documentation

## 2023-01-08 DIAGNOSIS — Z8049 Family history of malignant neoplasm of other genital organs: Secondary | ICD-10-CM | POA: Diagnosis not present

## 2023-01-08 DIAGNOSIS — E039 Hypothyroidism, unspecified: Secondary | ICD-10-CM | POA: Insufficient documentation

## 2023-01-08 DIAGNOSIS — Z7951 Long term (current) use of inhaled steroids: Secondary | ICD-10-CM | POA: Diagnosis not present

## 2023-01-08 DIAGNOSIS — Z1231 Encounter for screening mammogram for malignant neoplasm of breast: Secondary | ICD-10-CM

## 2023-01-08 NOTE — Consult Note (Signed)
NEW PATIENT EVALUATION  Name: Denise Allen  MRN: [id number]  Date:   01/08/2023     DOB: 1950/08/01   This 72 y.o. female patient presents to the clinic for initial evaluation of stage I squamous cell carcinoma of the left upper lobe.  REFERRING PHYSICIAN: Gracelyn Nurse, MD  CHIEF COMPLAINT:  Chief Complaint  Patient presents with   Lung Cancer    DIAGNOSIS: The encounter diagnosis was Primary cancer of left upper lobe of lung (HCC).   PREVIOUS INVESTIGATIONS:  CT scans PET CT scans reviewed Cytology and pathology reports reviewed Clinical notes reviewed  HPI: Patient is a 72 year old female who presents with an abnormal CT scan showing a left upper lobe lung nodule suspicious for malignancy.  She underwent navigational bronchoscopy which was positive for moderately differentiated squamous cell carcinoma.  She does have significant COPD dyspnea on exertion.  She is still a current smoker.  PET CT scan was performed showing a 7 mm pulmonary nodule left upper lobe which was borderline hypermetabolic.  She was seen by Dr. Cliffton Asters thoracic surgeon declined for surgery based on her pulmonary function and medical comorbidities including CHF COPD and anemia.  She is seen today for radiation oncology opinion.  She does confirm dyspnea on exertion slight productive cough no hemoptysis.  PLANNED TREATMENT REGIMEN: SBRT  PAST MEDICAL HISTORY:  has a past medical history of Anemia, Arthritis, CHF (congestive heart failure) (HCC) (05/2022), Chronic cough, COPD (chronic obstructive pulmonary disease) (HCC), Dupuytren contracture, and Hypothyroidism.    PAST SURGICAL HISTORY:  Past Surgical History:  Procedure Laterality Date   BREAST EXCISIONAL BIOPSY Left 90s   benign   BRONCHIAL BIOPSY  11/29/2022   Procedure: BRONCHIAL BIOPSIES;  Surgeon: Raechel Chute, MD;  Location: [facility];  Service: Pulmonary;;   BRONCHIAL NEEDLE ASPIRATION BIOPSY  11/29/2022   Procedure: BRONCHIAL NEEDLE  ASPIRATION BIOPSIES;  Surgeon: Raechel Chute, MD;  Location: [facility];  Service: Pulmonary;;   COLONOSCOPY  07/02/2007   COLONOSCOPY N/A 05/02/2021   Procedure: COLONOSCOPY;  Surgeon: Toledo, Boykin Nearing, MD;  Location: [facility];  Service: Gastroenterology;  Laterality: N/A;   ENDOBRONCHIAL ULTRASOUND  11/29/2022   Procedure: ENDOBRONCHIAL ULTRASOUND;  Surgeon: Raechel Chute, MD;  Location: [facility];  Service: Pulmonary;;   MASTECTOMY PARTIAL / LUMPECTOMY Left    benign   TUBAL LIGATION  1976    FAMILY HISTORY: family history includes Cerebral aneurysm in her sister; Cervical cancer in her mother; Prostate cancer in her father; Thyroid disease in her mother.  SOCIAL HISTORY:  reports that she has been smoking cigarettes. She has a 53.00 pack-year smoking history. She has been exposed to tobacco smoke. She has never used smokeless tobacco. She reports current alcohol use of about 6.0 standard drinks of alcohol per week. She reports that she does not use drugs.  ALLERGIES: Penicillins  MEDICATIONS:  Current Outpatient Medications  Medication Sig Dispense Refill   albuterol (PROVENTIL) (2.5 MG/3ML) 0.083% nebulizer solution Take 3 mLs (2.5 mg total) by nebulization every 4 (four) hours as needed for wheezing or shortness of breath. 75 mL 12   albuterol (VENTOLIN HFA) 108 (90 Base) MCG/ACT inhaler Inhale 2 puffs into the lungs every 6 (six) hours as needed for wheezing or shortness of breath (COPD).     aspirin EC 81 MG tablet Take 1 tablet (81 mg total) by mouth daily. Swallow whole. 30 tablet 12   atorvastatin (LIPITOR) 40 MG tablet Take 1 tablet (40 mg total) by mouth daily. 30  tablet 5   budesonide-formoterol (SYMBICORT) 80-4.5 MCG/ACT inhaler Inhale 2 puffs into the lungs 2 (two) times daily. (Patient taking differently: Inhale 2 puffs into the lungs at bedtime. Additional  2 puff in the morning if humid) 3 each 3   cyanocobalamin (VITAMIN B12) 1000 MCG tablet Take 1 tablet  (1,000 mcg total) by mouth daily. 30 tablet 5   empagliflozin (JARDIANCE) 10 MG TABS tablet Take 1 tablet (10 mg total) by mouth daily before breakfast. 90 tablet 3   furosemide (LASIX) 40 MG tablet Take 1 tablet (40 mg total) by mouth daily. (Patient taking differently: Take 40 mg by mouth daily as needed for fluid.) 30 tablet 0   levothyroxine (SYNTHROID) 75 MCG tablet Take 75 mcg by mouth daily before breakfast.     metoprolol succinate (TOPROL XL) 25 MG 24 hr tablet Take 1 tablet (25 mg total) by mouth daily. 30 tablet 5   sacubitril-valsartan (ENTRESTO) 24-26 MG Take 2 tablets by mouth 2 (two) times daily.     sacubitril-valsartan (ENTRESTO) 49-51 MG Take 1 tablet by mouth 2 (two) times daily. 180 tablet 3   spironolactone (ALDACTONE) 25 MG tablet Take 1 tablet (25 mg total) by mouth daily. 30 tablet 5   No current facility-administered medications for this encounter.   Facility-Administered Medications Ordered in Other Encounters  Medication Dose Route Frequency Provider Last Rate Last Admin   albuterol (PROVENTIL) (2.5 MG/3ML) 0.083% nebulizer solution 2.5 mg  2.5 mg Nebulization Once Raechel Chute, MD        ECOG PERFORMANCE STATUS:  0 - Asymptomatic  REVIEW OF SYSTEMS: Patient does have history of anemia arthritis COPD CHF chronic cough hypothyroidism Patient denies any weight loss, fatigue, weakness, fever, chills or night sweats. Patient denies any loss of vision, blurred vision. Patient denies any ringing  of the ears or hearing loss. No irregular heartbeat. Patient denies heart murmur or history of fainting. Patient denies any chest pain or pain radiating to her upper extremities. Patient denies any shortness of breath, difficulty breathing at night, cough or hemoptysis. Patient denies any swelling in the lower legs. Patient denies any nausea vomiting, vomiting of blood, or coffee ground material in the vomitus. Patient denies any stomach pain. Patient states has had normal bowel  movements no significant constipation or diarrhea. Patient denies any dysuria, hematuria or significant nocturia. Patient denies any problems walking, swelling in the joints or loss of balance. Patient denies any skin changes, loss of hair or loss of weight. Patient denies any excessive worrying or anxiety or significant depression. Patient denies any problems with insomnia. Patient denies excessive thirst, polyuria, polydipsia. Patient denies any swollen glands, patient denies easy bruising or easy bleeding. Patient denies any recent infections, allergies or URI. Patient "s visual fields have not changed significantly in recent time.   PHYSICAL EXAM: BP (!) 143/79   Pulse 74   Temp 97.6 F (36.4 C)   Resp 20   Ht 5\' 3"  (1.6 m) Comment: stated Ht  Wt 111 lb 14.4 oz (50.8 kg)   BMI 19.82 kg/m  Well-developed well-nourished patient in NAD. HEENT reveals PERLA, EOMI, discs not visualized.  Oral cavity is clear. No oral mucosal lesions are identified. Neck is clear without evidence of cervical or supraclavicular adenopathy. Lungs are clear to A&P. Cardiac examination is essentially unremarkable with regular rate and rhythm without murmur rub or thrill. Abdomen is benign with no organomegaly or masses noted. Motor sensory and DTR levels are equal and symmetric in the  upper and lower extremities. Cranial nerves II through XII are grossly intact. Proprioception is intact. No peripheral adenopathy or edema is identified. No motor or sensory levels are noted. Crude visual fields are within normal range.  LABORATORY DATA: Pathology and cytology reports reviewed    RADIOLOGY RESULTS: CT scan and PET CT scan reviewed compatible with above-stated findings   IMPRESSION: Stage I squamous cell carcinoma of the left upper lobe in 72 year old female declined for surgery based on PFTs and medical comorbidities  PLAN: At this time I have recommended SBRT.  Will plan on delivering 50 Gray in 5 fractions.  Based on  squamous cell histology I believe that will be sufficient for complete eradication of tumor.  She also has other small nodularities within her lung which in the future may pan out to be other sites of disease.  We will clinically follow this with CT scans over time.  Risks and benefits of SBRT were discussed with the patient she comprehends my recommendations well.  I have personally set up and ordered CT simulation.  I would like to take this opportunity to thank you for allowing me to participate in the care of your patient.Carmina Miller, MD

## 2023-01-10 ENCOUNTER — Ambulatory Visit (INDEPENDENT_AMBULATORY_CARE_PROVIDER_SITE_OTHER): Payer: Medicare Other | Admitting: Student in an Organized Health Care Education/Training Program

## 2023-01-10 ENCOUNTER — Encounter: Payer: Self-pay | Admitting: Student in an Organized Health Care Education/Training Program

## 2023-01-10 VITALS — BP 120/84 | HR 71 | Temp 97.6°F | Ht 63.0 in | Wt 112.4 lb

## 2023-01-10 DIAGNOSIS — J449 Chronic obstructive pulmonary disease, unspecified: Secondary | ICD-10-CM

## 2023-01-10 DIAGNOSIS — C3492 Malignant neoplasm of unspecified part of left bronchus or lung: Secondary | ICD-10-CM

## 2023-01-10 DIAGNOSIS — F17210 Nicotine dependence, cigarettes, uncomplicated: Secondary | ICD-10-CM

## 2023-01-10 DIAGNOSIS — Z87891 Personal history of nicotine dependence: Secondary | ICD-10-CM

## 2023-01-10 MED ORDER — SPIRIVA RESPIMAT 2.5 MCG/ACT IN AERS: 2.0000 | INHALATION_SPRAY | Freq: Every day | RESPIRATORY_TRACT | Status: DC

## 2023-01-11 NOTE — Progress Notes (Signed)
Synopsis: Referred in . by Gracelyn Nurse, MD  Assessment & Plan:   #Stage IA1 NSCLCa (squamous)   LUL 8 mm nodule with biopsy proven squamous cell carcinoma. Referred to oncology, thoracic surgery (not surgical candidate) and rad/onc (for SBRT). She will continue to follow up with Dr. Rushie Chestnut and Dr. Donneta Romberg for management of her malignancy.  #COPD (FEV1 66% predicted, DLCO 27% predicted)  History of smoking history and COPD for which she is maintained on Symbicort, and not on LAMA. I will give her a sample of Spiriva Respimat today and she will let us know if her symptoms improve with the added LAMA. If so, will send in a prescription.  -Spiriva Respimat 2.5 mcg/actuation two puffs twice daily  Return in about 1 year (around 01/10/2024).  I spent 30 minutes caring for this patient today, including preparing to see the patient, obtaining a medical history , reviewing a separately obtained history, performing a medically appropriate examination and/or evaluation, counseling and educating the patient/family/caregiver, ordering medications, tests, or procedures, documenting clinical information in the electronic health record, and independently interpreting results (not separately reported/billed) and communicating results to the patient/family/caregiver  Raechel Chute, MD [facility]   End of visit medications:  Meds ordered this encounter  Medications   Tiotropium Bromide Monohydrate (SPIRIVA RESPIMAT) 2.5 MCG/ACT AERS    Sig: Inhale 2 puffs into the lungs daily.    Order Specific Question:   Lot Number?    Answer:   [id number]    Order Specific Question:   Expiration Date?    Answer:   07/01/2024    Order Specific Question:   Quantity    Answer:   2     Current Outpatient Medications:    albuterol (PROVENTIL) (2.5 MG/3ML) 0.083% nebulizer solution, Take 3 mLs (2.5 mg total) by nebulization every 4 (four) hours as needed for wheezing or shortness of  breath., Disp: 75 mL, Rfl: 12   albuterol (VENTOLIN HFA) 108 (90 Base) MCG/ACT inhaler, Inhale 2 puffs into the lungs every 6 (six) hours as needed for wheezing or shortness of breath (COPD)., Disp: , Rfl:    aspirin EC 81 MG tablet, Take 1 tablet (81 mg total) by mouth daily. Swallow whole., Disp: 30 tablet, Rfl: 12   atorvastatin (LIPITOR) 40 MG tablet, Take 1 tablet (40 mg total) by mouth daily., Disp: 30 tablet, Rfl: 5   budesonide-formoterol (SYMBICORT) 80-4.5 MCG/ACT inhaler, Inhale 2 puffs into the lungs 2 (two) times daily. (Patient taking differently: Inhale 2 puffs into the lungs at bedtime. Additional  2 puff in the morning if humid), Disp: 3 each, Rfl: 3   cyanocobalamin (VITAMIN B12) 1000 MCG tablet, Take 1 tablet (1,000 mcg total) by mouth daily., Disp: 30 tablet, Rfl: 5   empagliflozin (JARDIANCE) 10 MG TABS tablet, Take 1 tablet (10 mg total) by mouth daily before breakfast., Disp: 90 tablet, Rfl: 3   furosemide (LASIX) 40 MG tablet, Take 1 tablet (40 mg total) by mouth daily. (Patient taking differently: Take 40 mg by mouth daily as needed for fluid.), Disp: 30 tablet, Rfl: 0   levothyroxine (SYNTHROID) 75 MCG tablet, Take 75 mcg by mouth daily before breakfast., Disp: , Rfl:    metoprolol succinate (TOPROL XL) 25 MG 24 hr tablet, Take 1 tablet (25 mg total) by mouth daily., Disp: 30 tablet, Rfl: 5   sacubitril-valsartan (ENTRESTO) 49-51 MG, Take 1 tablet by mouth 2 (two) times daily., Disp: 180 tablet, Rfl: 3   spironolactone (  ALDACTONE) 25 MG tablet, Take 1 tablet (25 mg total) by mouth daily., Disp: 30 tablet, Rfl: 5   Tiotropium Bromide Monohydrate (SPIRIVA RESPIMAT) 2.5 MCG/ACT AERS, Inhale 2 puffs into the lungs daily., Disp: , Rfl:    sacubitril-valsartan (ENTRESTO) 24-26 MG, Take 2 tablets by mouth 2 (two) times daily. (Patient not taking: Reported on 01/10/2023), Disp: , Rfl:  No current facility-administered medications for this visit.  Facility-Administered Medications  Ordered in Other Visits:    albuterol (PROVENTIL) (2.5 MG/3ML) 0.083% nebulizer solution 2.5 mg, 2.5 mg, Nebulization, Once, Raechel Chute, MD   Subjective:   PATIENT ID: Denise Allen GENDER: female DOB: 28-May-1951, MRN: [id number]  Chief Complaint  Patient presents with   Follow-up    Nodule. Bronchoscopy on 11/29/2022 DOE. No wheezing. Cough with clear sputum.    HPI  Patient is a pleasant 72 year old female with a past medical history of COPD, followed by her primary care physician, who presents to clinic for follow up following robotic assisted navigational bronchoscopy.   Patient was enrolled in our lung cancer screening program and was noted to have a small left upper lobe nodule 1 year ago.  On repeat CT scan this year, the nodule appears to have grown measuring up to 8 mm in diameter. She underwent robotic assisted navigational bronchoscopy on 11/29/2022 with navigation to the LUL nodule and cytology showing moderately differentiated squamous cell carcinoma. Furthermore, EBUS was performed during the procedure and no enlarged lymph nodes were encountered.  Patient was referred to oncology and was seen by Dr. Donneta Romberg, thoracic surgery and seen by Dr. Cliffton Asters, and radiation oncology and seen by Dr. Rushie Chestnut. Patient's pulmonary function (based on PFT) felt to be too weak to allow for surgical resection and the patient was referred back to rad onc for SBRT.  Patient reports no change in symptoms. She continues to have exertional dyspnea (such as going up a hill or incline) and has an occasional cough. She doesn't have any wheeze or chest tightness. She was treated for a COPD exacerbation in December of 2023. She was referred to the heart failure team and is prescribed goal-directed medical therapy for HFmrEF (Jardiance, Entresto, metoprolol, spironolactone, and as needed furosemide).  She is maintained on Symbicort and albuterol.   Patient used to work in office job for an Marketing executive.  She has a longstanding history of smoking (53 pack years).  She does not have any occupational exposures.  No personal history of lung cancer.   Ancillary information including prior medications, full medical/surgical/family/social histories, and PFTs (when available) are listed below and have been reviewed.    Chest CT 09/2021 Lungs/Pleura: Centrilobular and paraseptal emphysema evident. Biapical pleuroparenchymal scarring again noted. As before, tiny pulmonary nodules are identified, stable. No new suspicious pulmonary nodule or mass. No focal airspace consolidation. No pleural effusion.   Chest CT 10/2022   IMPRESSION: 1. Lung-RADS 4B, suspicious. Additional imaging evaluation or consultation with Pulmonology or Thoracic Surgery recommended. Enlarging left upper lobe pulmonary nodule of 7.3 mm. Potential clinical strategies include three-month follow-up diagnostic chest CT, PET (low end of resolution) or biopsy.  Ancillary information including prior medications, full medical/surgical/family/social histories, and PFTs (when available) are listed below and have been reviewed.   Review of Systems  Constitutional:  Negative for chills, fever, malaise/fatigue and weight loss.  Respiratory:  Positive for cough and shortness of breath. Negative for hemoptysis, sputum production and wheezing.   Cardiovascular:  Negative for chest pain.     Objective:  Vitals:   01/10/23 1103  BP: 120/84  Pulse: 71  Temp: 97.6 F (36.4 C)  SpO2: 99%  Weight: 112 lb 6.4 oz (51 kg)  Height: 5\' 3"  (1.6 m)   99% on RA BMI Readings from Last 3 Encounters:  01/10/23 19.91 kg/m  01/08/23 19.82 kg/m  12/27/22 19.66 kg/m   Wt Readings from Last 3 Encounters:  01/10/23 112 lb 6.4 oz (51 kg)  01/08/23 111 lb 14.4 oz (50.8 kg)  12/27/22 111 lb (50.3 kg)    Physical Exam Constitutional:      Appearance: Normal appearance.  HENT:     Head: Normocephalic.     Mouth/Throat:      Mouth: Mucous membranes are moist.  Cardiovascular:     Rate and Rhythm: Normal rate and regular rhythm.     Pulses: Normal pulses.     Heart sounds: Normal heart sounds.  Pulmonary:     Effort: Pulmonary effort is normal.     Breath sounds: Normal breath sounds. No wheezing, rhonchi or rales.  Abdominal:     Palpations: Abdomen is soft.  Musculoskeletal:     Right lower leg: No edema.     Left lower leg: No edema.  Neurological:     General: No focal deficit present.     Mental Status: She is alert and oriented to person, place, and time. Mental status is at baseline.       Ancillary Information    Past Medical History:  Diagnosis Date   Anemia    Arthritis    CHF (congestive heart failure) (HCC) 05/2022   Chronic cough    COPD (chronic obstructive pulmonary disease) (HCC)    Dupuytren contracture    Hypothyroidism      Family History  Problem Relation Age of Onset   Cervical cancer Mother    Thyroid disease Mother    Prostate cancer Father    Cerebral aneurysm Sister      Past Surgical History:  Procedure Laterality Date   BREAST EXCISIONAL BIOPSY Left 90s   benign   BRONCHIAL BIOPSY  11/29/2022   Procedure: BRONCHIAL BIOPSIES;  Surgeon: Raechel Chute, MD;  Location: [facility];  Service: Pulmonary;;   BRONCHIAL NEEDLE ASPIRATION BIOPSY  11/29/2022   Procedure: BRONCHIAL NEEDLE ASPIRATION BIOPSIES;  Surgeon: Raechel Chute, MD;  Location: [facility];  Service: Pulmonary;;   COLONOSCOPY  07/02/2007   COLONOSCOPY N/A 05/02/2021   Procedure: COLONOSCOPY;  Surgeon: Toledo, Boykin Nearing, MD;  Location: [facility];  Service: Gastroenterology;  Laterality: N/A;   ENDOBRONCHIAL ULTRASOUND  11/29/2022   Procedure: ENDOBRONCHIAL ULTRASOUND;  Surgeon: Raechel Chute, MD;  Location: [facility];  Service: Pulmonary;;   MASTECTOMY PARTIAL / LUMPECTOMY Left    benign   TUBAL LIGATION  1976    Social History   Socioeconomic History   Marital status: Married     Spouse name: Abrar Delahanty   Number of children: Not on file   Years of education: Not on file   Highest education level: Not on file  Occupational History   Not on file  Tobacco Use   Smoking status: Every Day    Current packs/day: 1.00    Average packs/day: 1 pack/day for 53.0 years (53.0 ttl pk-yrs)    Types: Cigarettes    Passive exposure: Current   Smokeless tobacco: Never   Tobacco comments:    1 PDD- khj 01/13/2023  Vaping Use   Vaping status: Never Used  Substance and Sexual Activity   Alcohol  use: Yes    Alcohol/week: 6.0 standard drinks of alcohol    Types: 6 Glasses of wine per week    Comment: wine coolers   Drug use: Never   Sexual activity: Yes  Other Topics Concern   Not on file  Social History Narrative   Not on file   Social Determinants of Health   Financial Resource Strain: Not on file  Food Insecurity: No Food Insecurity (01/08/2023)   Hunger Vital Sign    Worried About Running Out of Food in the Last Year: Never true    Ran Out of Food in the Last Year: Never true  Transportation Needs: No Transportation Needs (01/08/2023)   PRAPARE - Administrator, Civil Service (Medical): No    Lack of Transportation (Non-Medical): No  Physical Activity: Not on file  Stress: Not on file  Social Connections: Unknown (01/08/2023)   Social Connection and Isolation Panel [NHANES]    Frequency of Communication with Friends and Family: Never    Frequency of Social Gatherings with Friends and Family: Not on file    Attends Religious Services: Not on file    Active Member of Clubs or Organizations: Not on file    Attends Banker Meetings: Not on file    Marital Status: Not on file  Intimate Partner Violence: Not At Risk (01/08/2023)   Humiliation, Afraid, Rape, and Kick questionnaire    Fear of Current or Ex-Partner: No    Emotionally Abused: No    Physically Abused: No    Sexually Abused: No     Allergies  Allergen Reactions   Penicillins  Other (See Comments)    Patient states "NOT ALLERGIC" causes severe yeast infection. Prefers NOT to use it.     CBC    Component Value Date/Time   WBC 8.4 11/29/2022 0956   RBC 4.60 11/29/2022 0956   HGB 13.2 11/29/2022 0956   HCT 41.4 11/29/2022 0956   PLT 304 11/29/2022 0956   MCV 90.0 11/29/2022 0956   MCH 28.7 11/29/2022 0956   MCHC 31.9 11/29/2022 0956   RDW 14.6 11/29/2022 0956   LYMPHSABS 1.2 06/15/2022 0432   MONOABS 0.8 06/15/2022 0432   EOSABS 0.0 06/15/2022 0432   BASOSABS 0.0 06/15/2022 0432    Pulmonary Functions Testing Results:    Latest Ref Rng & Units 12/19/2022   11:30 AM  PFT Results  FVC-Pre L 2.14   FVC-Predicted Pre % 77   FVC-Post L 2.29   FVC-Predicted Post % 82   Pre FEV1/FVC % % 59   Post FEV1/FCV % % 60   FEV1-Pre L 1.27   FEV1-Predicted Pre % 61   FEV1-Post L 1.37   DLCO uncorrected ml/min/mmHg 5.20   DLCO UNC% % 27   DLVA Predicted % 66   TLC L 4.90   TLC % Predicted % 99   RV % Predicted % 138     Outpatient Medications Prior to Visit  Medication Sig Dispense Refill   albuterol (PROVENTIL) (2.5 MG/3ML) 0.083% nebulizer solution Take 3 mLs (2.5 mg total) by nebulization every 4 (four) hours as needed for wheezing or shortness of breath. 75 mL 12   albuterol (VENTOLIN HFA) 108 (90 Base) MCG/ACT inhaler Inhale 2 puffs into the lungs every 6 (six) hours as needed for wheezing or shortness of breath (COPD).     aspirin EC 81 MG tablet Take 1 tablet (81 mg total) by mouth daily. Swallow whole. 30 tablet 12  atorvastatin (LIPITOR) 40 MG tablet Take 1 tablet (40 mg total) by mouth daily. 30 tablet 5   budesonide-formoterol (SYMBICORT) 80-4.5 MCG/ACT inhaler Inhale 2 puffs into the lungs 2 (two) times daily. (Patient taking differently: Inhale 2 puffs into the lungs at bedtime. Additional  2 puff in the morning if humid) 3 each 3   cyanocobalamin (VITAMIN B12) 1000 MCG tablet Take 1 tablet (1,000 mcg total) by mouth daily. 30 tablet 5    empagliflozin (JARDIANCE) 10 MG TABS tablet Take 1 tablet (10 mg total) by mouth daily before breakfast. 90 tablet 3   furosemide (LASIX) 40 MG tablet Take 1 tablet (40 mg total) by mouth daily. (Patient taking differently: Take 40 mg by mouth daily as needed for fluid.) 30 tablet 0   levothyroxine (SYNTHROID) 75 MCG tablet Take 75 mcg by mouth daily before breakfast.     metoprolol succinate (TOPROL XL) 25 MG 24 hr tablet Take 1 tablet (25 mg total) by mouth daily. 30 tablet 5   sacubitril-valsartan (ENTRESTO) 49-51 MG Take 1 tablet by mouth 2 (two) times daily. 180 tablet 3   spironolactone (ALDACTONE) 25 MG tablet Take 1 tablet (25 mg total) by mouth daily. 30 tablet 5   sacubitril-valsartan (ENTRESTO) 24-26 MG Take 2 tablets by mouth 2 (two) times daily. (Patient not taking: Reported on 01/10/2023)     Facility-Administered Medications Prior to Visit  Medication Dose Route Frequency Provider Last Rate Last Admin   albuterol (PROVENTIL) (2.5 MG/3ML) 0.083% nebulizer solution 2.5 mg  2.5 mg Nebulization Once Raechel Chute, MD

## 2023-01-15 ENCOUNTER — Ambulatory Visit
Admission: RE | Admit: 2023-01-15 | Discharge: 2023-01-15 | Disposition: A | Discharge status: Home / Self Care | Payer: Medicare Other | Source: Ambulatory Visit | Attending: Radiation Oncology | Admitting: Radiation Oncology

## 2023-01-15 ENCOUNTER — Encounter: Payer: Self-pay | Admitting: *Deleted

## 2023-01-15 ENCOUNTER — Ambulatory Visit
Admission: RE | Admit: 2023-01-15 | Discharge: 2023-01-15 | Disposition: A | Discharge status: Home / Self Care | Payer: Medicare Other | Source: Ambulatory Visit | Attending: Internal Medicine | Admitting: Internal Medicine

## 2023-01-15 DIAGNOSIS — Z1231 Encounter for screening mammogram for malignant neoplasm of breast: Secondary | ICD-10-CM | POA: Insufficient documentation

## 2023-01-15 DIAGNOSIS — Z510 Encounter for antineoplastic radiation therapy: Secondary | ICD-10-CM | POA: Diagnosis not present

## 2023-01-15 DIAGNOSIS — C3412 Malignant neoplasm of upper lobe, left bronchus or lung: Secondary | ICD-10-CM

## 2023-01-15 HISTORY — DX: Malignant neoplasm of unspecified part of unspecified bronchus or lung: C34.90

## 2023-01-15 NOTE — Progress Notes (Signed)
Met with patient after CT simulation today. All questions answered during visit. Reviewed with pt that she will follow up with Dr. Donneta Romberg 3 months after completing radiation treatment with a CT scan prior to her appt. Informed that will schedule appts and call her with appts once scheduled. Pt verbalized understanding. Nothing further needed at this time.

## 2023-01-18 DIAGNOSIS — Z510 Encounter for antineoplastic radiation therapy: Secondary | ICD-10-CM | POA: Diagnosis not present

## 2023-01-23 ENCOUNTER — Encounter: Payer: Self-pay | Admitting: *Deleted

## 2023-01-23 ENCOUNTER — Ambulatory Visit
Admission: RE | Admit: 2023-01-23 | Discharge: 2023-01-23 | Disposition: A | Discharge status: Home / Self Care | Payer: Medicare Other | Source: Ambulatory Visit | Attending: Radiation Oncology | Admitting: Radiation Oncology

## 2023-01-23 ENCOUNTER — Other Ambulatory Visit: Payer: Self-pay

## 2023-01-23 DIAGNOSIS — Z510 Encounter for antineoplastic radiation therapy: Secondary | ICD-10-CM | POA: Diagnosis not present

## 2023-01-23 LAB — RAD ONC ARIA SESSION SUMMARY
Course Elapsed Days: 0
Plan Fractions Treated to Date: 1
Plan Prescribed Dose Per Fraction: 18 Gy
Plan Total Fractions Prescribed: 3
Plan Total Prescribed Dose: 54 Gy
Reference Point Dosage Given to Date: 18 Gy
Reference Point Session Dosage Given: 18 Gy
Session Number: 1

## 2023-01-23 NOTE — Progress Notes (Signed)
Denise Allen, [name], MD (last seen 04/24) Primary Cardiologist: none  HPI:  Denise Allen is a 72 y/o female with a history of COPD, thyroid disease, lung cancer (diagnosed 05/24), tobacco use and chronic heart failure.   Admitted 06/12/22 due to COPD/ HF exacerbation.   Echo 06/12/22: EF of 40-45% along with mild/ moderate LVH and mild/ moderate MR/TR.   She presents today for a HF follow-up visit with a chief complaint of minimal SOB with moderate exertion. Chronic in nature. Has associated back pain, minimal fatigue, cough, occasional dizziness and nasal congestion along with this. Denies chest pain, palpitations, abdominal distention, pedal edema, weight gain or difficulty sleeping.   Has used nebulizer a couple of times since last here.   2 more radiation treatments will be given and then she meets with oncologist ~ 1 week after last radiation treatment. Has met with surgeon but is not a surgical candidate due to her PFT's and ongoing tobacco use.   ROS: All systems negative except as listed in HPI, PMH and Problem List.  SH:  Social History   Socioeconomic History   Marital status: Married    Spouse name: Denise Allen   Number of children: Not on file   Years of education: Not on file   Highest education level: Not on file  Occupational History   Not on file  Tobacco Use   Smoking status: Every Day    Current packs/day: 1.00    Average packs/day: 1 pack/day for 53.0 years (53.0 ttl pk-yrs)    Types: Cigarettes    Passive exposure: Current   Smokeless tobacco: Never   Tobacco comments:    1 PDD- khj 01/13/2023  Vaping Use   Vaping status: Never Used  Substance and Sexual Activity   Alcohol use: Yes    Alcohol/week: 6.0 standard drinks of alcohol    Types: 6 Glasses of wine per week    Comment: wine coolers   Drug use: Never   Sexual activity: Yes  Other Topics Concern   Not on file  Social History Narrative   Not on file   Social Determinants of Health   Financial  Resource Strain: Not on file  Food Insecurity: No Food Insecurity (01/08/2023)   Hunger Vital Sign    Worried About Running Out of Food in the Last Year: Never true    Ran Out of Food in the Last Year: Never true  Transportation Needs: No Transportation Needs (01/08/2023)   PRAPARE - Administrator, Civil Service (Medical): No    Lack of Transportation (Non-Medical): No  Physical Activity: Not on file  Stress: Not on file  Social Connections: Unknown (01/08/2023)   Social Connection and Isolation Panel [NHANES]    Frequency of Communication with Friends and Family: Never    Frequency of Social Gatherings with Friends and Family: Not on file    Attends Religious Services: Not on file    Active Member of Clubs or Organizations: Not on file    Attends Banker Meetings: Not on file    Marital Status: Not on file  Intimate Partner Violence: Not At Risk (01/08/2023)   Humiliation, Afraid, Rape, and Kick questionnaire    Fear of Current or Ex-Partner: No    Emotionally Abused: No    Physically Abused: No    Sexually Abused: No    FH:  Family History  Problem Relation Age of Onset   Cervical cancer Mother    Thyroid disease Mother  Prostate cancer Father    Cerebral aneurysm Sister     Past Medical History:  Diagnosis Date   Anemia    Arthritis    CHF (congestive heart failure) (HCC) 05/2022   Chronic cough    COPD (chronic obstructive pulmonary disease) (HCC)    Dupuytren contracture    Hypothyroidism    Lung cancer (HCC)     Current Outpatient Medications  Medication Sig Dispense Refill   albuterol (PROVENTIL) (2.5 MG/3ML) 0.083% nebulizer solution Take 3 mLs (2.5 mg total) by nebulization every 4 (four) hours as needed for wheezing or shortness of breath. 75 mL 12   albuterol (VENTOLIN HFA) 108 (90 Base) MCG/ACT inhaler Inhale 2 puffs into the lungs every 6 (six) hours as needed for wheezing or shortness of breath (COPD).     aspirin EC 81 MG  tablet Take 1 tablet (81 mg total) by mouth daily. Swallow whole. 30 tablet 12   atorvastatin (LIPITOR) 40 MG tablet Take 1 tablet (40 mg total) by mouth daily. 30 tablet 5   budesonide-formoterol (SYMBICORT) 80-4.5 MCG/ACT inhaler Inhale 2 puffs into the lungs 2 (two) times daily. (Patient taking differently: Inhale 2 puffs into the lungs at bedtime. Additional  2 puff in the morning if humid) 3 each 3   cyanocobalamin (VITAMIN B12) 1000 MCG tablet Take 1 tablet (1,000 mcg total) by mouth daily. 30 tablet 5   empagliflozin (JARDIANCE) 10 MG TABS tablet Take 1 tablet (10 mg total) by mouth daily before breakfast. 90 tablet 3   furosemide (LASIX) 40 MG tablet Take 1 tablet (40 mg total) by mouth daily. (Patient taking differently: Take 40 mg by mouth daily as needed for fluid.) 30 tablet 0   levothyroxine (SYNTHROID) 75 MCG tablet Take 75 mcg by mouth daily before breakfast.     metoprolol succinate (TOPROL XL) 25 MG 24 hr tablet Take 1 tablet (25 mg total) by mouth daily. 30 tablet 5   sacubitril-valsartan (ENTRESTO) 24-26 MG Take 2 tablets by mouth 2 (two) times daily. (Patient not taking: Reported on 01/10/2023)     sacubitril-valsartan (ENTRESTO) 49-51 MG Take 1 tablet by mouth 2 (two) times daily. 180 tablet 3   spironolactone (ALDACTONE) 25 MG tablet Take 1 tablet (25 mg total) by mouth daily. 30 tablet 5   Tiotropium Bromide Monohydrate (SPIRIVA RESPIMAT) 2.5 MCG/ACT AERS Inhale 2 puffs into the lungs daily.     No current facility-administered medications for this visit.   Facility-Administered Medications Ordered in Other Visits  Medication Dose Route Frequency Provider Last Rate Last Admin   albuterol (PROVENTIL) (2.5 MG/3ML) 0.083% nebulizer solution 2.5 mg  2.5 mg Nebulization Once Raechel Chute, MD        PHYSICAL EXAM:  General:  Well appearing. No resp difficulty HEENT: normal Neck: supple. JVP flat. No lymphadenopathy or thryomegaly appreciated. Cor: PMI normal. Regular rate &  rhythm. No rubs, gallops or murmurs. Lungs: clear Abdomen: soft, nontender, nondistended. No hepatosplenomegaly. No bruits or masses.  Extremities: no cyanosis, clubbing, rash, edema Neuro: alert & oriented x3, cranial nerves grossly intact. Moves all 4 extremities w/o difficulty. Affect pleasant.   ECG: not done   ASSESSMENT & PLAN:   1: Chronic heart failure with reduced ejection fraction- - suspect due to COPD/ anemia - NYHA class II - euvolemic today - weighing daily; reminded to call for an overnight weight gain of > 2 pounds or a weekly weight gain of > 5 pounds - weight down 2 pounds from last visit  here 1 month ago - Echo 06/12/22: EF of 40-45% along with mild/ moderate LVH and mild/ moderate MR/TR - plan to update echo later this year unless oncology needs it done sooner - adding "very little" salt and says that she's cut back a lot and is working on not adding any salt - drinking 3-4 glass of water and 2 cups of coffee and 1 wine cooler daily - continue jardiance 10mg  daily - continue entresto 49/51mg  BID; titrate at next visit if able - increase metoprolol succinate to 50mg  daily - continue spironolactone 25mg  daily - BNP 06/12/22 was 1015.7  2: COPD- - saw pulmonology (Dgayli) 07/24 - symbicort daily along with PRN abuterol - BMP 12/25/22 showed sodium 139, potassium 3.7, creatinine 0.7 & GFR >60  3: Tobacco use- - has been smoking since she was 72 y/o - has resumed smoking 1 ppd due to stress  4: Anemia- - hemoglobin 11/29/22 was 13.2 - saw PCP Letitia Libra) 04/24  5: Lung cancer- - biopsy done 11/29/22 on nodule in LUL - PET scan done 12/18/22 - saw oncology Donneta Romberg) 06/24; returns after last radiation treatment - saw thoracic surgeon Cliffton Asters) 06/24; not surgical candidate due to recent PFT's and continue tobacco use - has started radiation treatment  Return in 1 month, sooner if needed.

## 2023-01-24 ENCOUNTER — Encounter: Payer: Self-pay | Admitting: Family

## 2023-01-24 ENCOUNTER — Ambulatory Visit: Payer: Medicare Other | Admitting: Family

## 2023-01-24 VITALS — BP 116/62 | HR 72 | Wt 114.0 lb

## 2023-01-24 DIAGNOSIS — R0602 Shortness of breath: Secondary | ICD-10-CM | POA: Insufficient documentation

## 2023-01-24 DIAGNOSIS — F17210 Nicotine dependence, cigarettes, uncomplicated: Secondary | ICD-10-CM | POA: Insufficient documentation

## 2023-01-24 DIAGNOSIS — I5022 Chronic systolic (congestive) heart failure: Secondary | ICD-10-CM | POA: Diagnosis not present

## 2023-01-24 DIAGNOSIS — J449 Chronic obstructive pulmonary disease, unspecified: Secondary | ICD-10-CM | POA: Insufficient documentation

## 2023-01-24 DIAGNOSIS — M549 Dorsalgia, unspecified: Secondary | ICD-10-CM | POA: Diagnosis not present

## 2023-01-24 DIAGNOSIS — D649 Anemia, unspecified: Secondary | ICD-10-CM | POA: Diagnosis not present

## 2023-01-24 DIAGNOSIS — Z79899 Other long term (current) drug therapy: Secondary | ICD-10-CM | POA: Diagnosis not present

## 2023-01-24 DIAGNOSIS — Z7984 Long term (current) use of oral hypoglycemic drugs: Secondary | ICD-10-CM | POA: Insufficient documentation

## 2023-01-24 DIAGNOSIS — Z720 Tobacco use: Secondary | ICD-10-CM

## 2023-01-24 DIAGNOSIS — C3412 Malignant neoplasm of upper lobe, left bronchus or lung: Secondary | ICD-10-CM

## 2023-01-24 DIAGNOSIS — C3490 Malignant neoplasm of unspecified part of unspecified bronchus or lung: Secondary | ICD-10-CM | POA: Insufficient documentation

## 2023-01-24 MED ORDER — METOPROLOL SUCCINATE ER 50 MG PO TB24: 50.0000 mg | ORAL_TABLET | Freq: Every day | ORAL | 3 refills | Status: DC

## 2023-01-24 NOTE — Patient Instructions (Addendum)
INCREASE your Metoprolol to 50 MG  Use your current prescription before getting the new one by taking 2 tablets of Metoprolol 25 MG daily until you run out.  Follow up in 1 months with Clarisa Kindred, FNP

## 2023-01-27 ENCOUNTER — Other Ambulatory Visit: Payer: Self-pay

## 2023-01-27 ENCOUNTER — Ambulatory Visit
Admission: RE | Admit: 2023-01-27 | Discharge: 2023-01-27 | Disposition: A | Discharge status: Home / Self Care | Payer: Medicare Other | Source: Ambulatory Visit | Attending: Radiation Oncology | Admitting: Radiation Oncology

## 2023-01-27 DIAGNOSIS — Z510 Encounter for antineoplastic radiation therapy: Secondary | ICD-10-CM | POA: Diagnosis not present

## 2023-01-27 LAB — RAD ONC ARIA SESSION SUMMARY
Course Elapsed Days: 4
Plan Fractions Treated to Date: 2
Plan Prescribed Dose Per Fraction: 18 Gy
Plan Total Fractions Prescribed: 3
Plan Total Prescribed Dose: 54 Gy
Reference Point Dosage Given to Date: 36 Gy
Reference Point Session Dosage Given: 18 Gy
Session Number: 2

## 2023-01-29 ENCOUNTER — Other Ambulatory Visit: Payer: Self-pay

## 2023-01-29 ENCOUNTER — Ambulatory Visit
Admission: RE | Admit: 2023-01-29 | Discharge: 2023-01-29 | Disposition: A | Discharge status: Home / Self Care | Payer: Medicare Other | Source: Ambulatory Visit | Attending: Radiation Oncology | Admitting: Radiation Oncology

## 2023-01-29 DIAGNOSIS — Z510 Encounter for antineoplastic radiation therapy: Secondary | ICD-10-CM | POA: Diagnosis not present

## 2023-01-29 LAB — RAD ONC ARIA SESSION SUMMARY
Course Elapsed Days: 6
Plan Fractions Treated to Date: 3
Plan Prescribed Dose Per Fraction: 18 Gy
Plan Total Fractions Prescribed: 3
Plan Total Prescribed Dose: 54 Gy
Reference Point Dosage Given to Date: 54 Gy
Reference Point Session Dosage Given: 18 Gy
Session Number: 3

## 2023-02-03 ENCOUNTER — Ambulatory Visit: Payer: Medicare Other

## 2023-02-05 ENCOUNTER — Ambulatory Visit: Payer: Medicare Other

## 2023-02-25 ENCOUNTER — Encounter: Payer: Self-pay | Admitting: Family

## 2023-02-25 ENCOUNTER — Ambulatory Visit: Payer: Medicare Other | Attending: Family | Admitting: Family

## 2023-02-25 VITALS — BP 121/62 | HR 69 | Resp 14 | Wt 110.4 lb

## 2023-02-25 DIAGNOSIS — C3432 Malignant neoplasm of lower lobe, left bronchus or lung: Secondary | ICD-10-CM | POA: Diagnosis not present

## 2023-02-25 DIAGNOSIS — J449 Chronic obstructive pulmonary disease, unspecified: Secondary | ICD-10-CM

## 2023-02-25 DIAGNOSIS — C3412 Malignant neoplasm of upper lobe, left bronchus or lung: Secondary | ICD-10-CM

## 2023-02-25 DIAGNOSIS — F17210 Nicotine dependence, cigarettes, uncomplicated: Secondary | ICD-10-CM | POA: Insufficient documentation

## 2023-02-25 DIAGNOSIS — I509 Heart failure, unspecified: Secondary | ICD-10-CM | POA: Diagnosis present

## 2023-02-25 DIAGNOSIS — E039 Hypothyroidism, unspecified: Secondary | ICD-10-CM | POA: Diagnosis present

## 2023-02-25 DIAGNOSIS — I5022 Chronic systolic (congestive) heart failure: Secondary | ICD-10-CM

## 2023-02-25 DIAGNOSIS — Z720 Tobacco use: Secondary | ICD-10-CM | POA: Diagnosis not present

## 2023-02-25 DIAGNOSIS — D649 Anemia, unspecified: Secondary | ICD-10-CM

## 2023-02-25 MED ORDER — SPIRONOLACTONE 25 MG PO TABS: 25.0000 mg | ORAL_TABLET | Freq: Every day | ORAL | 3 refills | Status: DC

## 2023-02-25 MED ORDER — SACUBITRIL-VALSARTAN 97-103 MG PO TABS: 1.0000 | ORAL_TABLET | Freq: Two times a day (BID) | ORAL | 3 refills | Status: DC

## 2023-02-25 NOTE — Patient Instructions (Signed)
Finish your current entresto bottle by taking 2 tablets in the morning and 2 tablets in the evening until they are gone.   When you finish this and pick up your next bottle, it will be the higher dose so you will then resume taking 1 tablet in the morning and 1 tablet in the evening.

## 2023-02-25 NOTE — Progress Notes (Signed)
[id number], [name], MD (last seen 04/24) Primary Cardiologist: none  HPI:  Ms Heffelfinger is a 72 y/o female with a history of COPD, thyroid disease, lung cancer (diagnosed 05/24), tobacco use and chronic heart failure.   Admitted 06/12/22 due to COPD/ HF exacerbation.   Echo 06/12/22: EF of 40-45% along with mild/ moderate LVH and mild/ moderate MR/TR.   She presents today for a HF follow-up visit with a chief complaint of minimal SOB with moderate exertion. Chronic in nature. Has associated chronic difficulty sleeping, cough and occasional dizziness along with this. Denies fatigue, chest pain, palpitations, abdominal distention, pedal edema or weight gain.   At last visit, metoprolol was increased to 50mg  daily. No issues with this that she is aware of. Has finished her radiation treatments and upcoming CT in October. Mammogram was negative.   ROS: All systems negative except as listed in HPI, PMH and Problem List.  SH:  Social History   Socioeconomic History   Marital status: Married    Spouse name: Reuel Boom Olivares   Number of children: Not on file   Years of education: Not on file   Highest education level: Not on file  Occupational History   Not on file  Tobacco Use   Smoking status: Every Day    Current packs/day: 1.00    Average packs/day: 1 pack/day for 53.0 years (53.0 ttl pk-yrs)    Types: Cigarettes    Passive exposure: Current   Smokeless tobacco: Never   Tobacco comments:    1 PDD- khj 01/13/2023  Vaping Use   Vaping status: Never Used  Substance and Sexual Activity   Alcohol use: Yes    Alcohol/week: 6.0 standard drinks of alcohol    Types: 6 Glasses of wine per week    Comment: wine coolers   Drug use: Never   Sexual activity: Yes  Other Topics Concern   Not on file  Social History Narrative   Not on file   Social Determinants of Health   Financial Resource Strain: Not on file  Food Insecurity: No Food Insecurity (01/08/2023)   Hunger Vital Sign    Worried  About Running Out of Food in the Last Year: Never true    Ran Out of Food in the Last Year: Never true  Transportation Needs: No Transportation Needs (01/08/2023)   PRAPARE - Administrator, Civil Service (Medical): No    Lack of Transportation (Non-Medical): No  Physical Activity: Not on file  Stress: Not on file  Social Connections: Unknown (01/08/2023)   Social Connection and Isolation Panel [NHANES]    Frequency of Communication with Friends and Family: Never    Frequency of Social Gatherings with Friends and Family: Not on file    Attends Religious Services: Not on file    Active Member of Clubs or Organizations: Not on file    Attends Banker Meetings: Not on file    Marital Status: Not on file  Intimate Partner Violence: Not At Risk (01/08/2023)   Humiliation, Afraid, Rape, and Kick questionnaire    Fear of Current or Ex-Partner: No    Emotionally Abused: No    Physically Abused: No    Sexually Abused: No    FH:  Family History  Problem Relation Age of Onset   Cervical cancer Mother    Thyroid disease Mother    Prostate cancer Father    Cerebral aneurysm Sister     Past Medical History:  Diagnosis Date  Anemia    Arthritis    CHF (congestive heart failure) (HCC) 05/2022   Chronic cough    COPD (chronic obstructive pulmonary disease) (HCC)    Dupuytren contracture    Hypothyroidism    Lung cancer (HCC)     Current Outpatient Medications  Medication Sig Dispense Refill   albuterol (PROVENTIL) (2.5 MG/3ML) 0.083% nebulizer solution Take 3 mLs (2.5 mg total) by nebulization every 4 (four) hours as needed for wheezing or shortness of breath. 75 mL 12   albuterol (VENTOLIN HFA) 108 (90 Base) MCG/ACT inhaler Inhale 2 puffs into the lungs every 6 (six) hours as needed for wheezing or shortness of breath (COPD).     aspirin EC 81 MG tablet Take 1 tablet (81 mg total) by mouth daily. Swallow whole. 30 tablet 12   atorvastatin (LIPITOR) 40 MG tablet  Take 1 tablet (40 mg total) by mouth daily. 30 tablet 5   budesonide-formoterol (SYMBICORT) 80-4.5 MCG/ACT inhaler Inhale 2 puffs into the lungs 2 (two) times daily. (Patient taking differently: Inhale 2 puffs into the lungs at bedtime. Additional  2 puff in the morning if humid) 3 each 3   cyanocobalamin (VITAMIN B12) 1000 MCG tablet Take 1 tablet (1,000 mcg total) by mouth daily. 30 tablet 5   empagliflozin (JARDIANCE) 10 MG TABS tablet Take 1 tablet (10 mg total) by mouth daily before breakfast. 90 tablet 3   furosemide (LASIX) 40 MG tablet Take 1 tablet (40 mg total) by mouth daily. (Patient taking differently: Take 40 mg by mouth daily as needed for fluid.) 30 tablet 0   levothyroxine (SYNTHROID) 75 MCG tablet Take 75 mcg by mouth daily before breakfast.     metoprolol succinate (TOPROL-XL) 50 MG 24 hr tablet Take 1 tablet (50 mg total) by mouth daily. Take with or immediately following a meal. 90 tablet 3   sacubitril-valsartan (ENTRESTO) 49-51 MG Take 1 tablet by mouth 2 (two) times daily. 180 tablet 3   spironolactone (ALDACTONE) 25 MG tablet Take 1 tablet (25 mg total) by mouth daily. 30 tablet 5   Tiotropium Bromide Monohydrate (SPIRIVA RESPIMAT) 2.5 MCG/ACT AERS Inhale 2 puffs into the lungs daily.     No current facility-administered medications for this visit.   Facility-Administered Medications Ordered in Other Visits  Medication Dose Route Frequency Provider Last Rate Last Admin   albuterol (PROVENTIL) (2.5 MG/3ML) 0.083% nebulizer solution 2.5 mg  2.5 mg Nebulization Once Raechel Chute, MD       Vitals:   02/25/23 1055  BP: 121/62  Pulse: 69  Resp: 14  SpO2: 99%  Weight: 110 lb 6 oz (50.1 kg)   Wt Readings from Last 3 Encounters:  02/25/23 110 lb 6 oz (50.1 kg)  01/24/23 114 lb (51.7 kg)  01/10/23 112 lb 6.4 oz (51 kg)   Lab Results  Component Value Date   CREATININE 0.70 12/25/2022   CREATININE 0.66 11/29/2022   CREATININE 0.68 09/13/2022    PHYSICAL  EXAM:  General:  Well appearing. No resp difficulty HEENT: normal Neck: supple. JVP flat. No lymphadenopathy or thryomegaly appreciated. Cor: PMI normal. Regular rate & rhythm. No rubs, gallops or murmurs. Lungs: clear Abdomen: soft, nontender, nondistended. No hepatosplenomegaly. No bruits or masses.  Extremities: no cyanosis, clubbing, rash, edema Neuro: alert & oriented x3, cranial nerves grossly intact. Moves all 4 extremities w/o difficulty. Affect pleasant.   ECG: not done   ASSESSMENT & PLAN:   1: Chronic heart failure with reduced ejection fraction- - suspect due  to COPD/ anemia - NYHA class II - euvolemic today - weighing daily; reminded to call for an overnight weight gain of > 2 pounds or a weekly weight gain of > 5 pounds - weight down 4 pounds from last visit here 1 month ago - Echo 06/12/22: EF of 40-45% along with mild/ moderate LVH and mild/ moderate MR/TR - plan to update echo later this year  - adding "very little" salt and says that she's cut back a lot and is working on not adding any salt - drinking 3-4 glass of water and 2 cups of coffee and 1 wine cooler daily - continue jardiance 10mg  daily - increase entresto to 97/103mg  BID - continue metoprolol succinate 50mg  daily - continue spironolactone 25mg  daily - BMP next visit - BNP 06/12/22 was 1015.7  2: COPD- - saw pulmonology (Dgayli) 07/24 - symbicort daily along with PRN abuterol - BMP 12/25/22 showed sodium 139, potassium 3.7, creatinine 0.7 & GFR >60  3: Tobacco use- - has been smoking since she was 72 y/o - continues to smoke 1 ppd & has no desire to quit  4: Anemia- - hemoglobin 11/29/22 was 13.2 - saw PCP Letitia Libra) 04/24  5: Lung cancer- - biopsy done 11/29/22 on nodule in LUL - PET scan done 12/18/22 - saw oncology Donneta Romberg) 06/24 - saw thoracic surgeon Cliffton Asters) 06/24; not surgical candidate due to recent PFT's and continue tobacco use - has finished radiation treatment - has  upcoming CT in October  Return in 1 month, sooner if needed.

## 2023-02-26 ENCOUNTER — Ambulatory Visit
Admission: RE | Admit: 2023-02-26 | Discharge: 2023-02-26 | Disposition: A | Discharge status: Home / Self Care | Payer: Medicare Other | Source: Ambulatory Visit | Attending: Radiation Oncology | Admitting: Radiation Oncology

## 2023-02-26 ENCOUNTER — Encounter: Payer: Self-pay | Admitting: Radiation Oncology

## 2023-02-26 VITALS — BP 122/71 | HR 68 | Temp 97.1°F | Resp 16 | Wt 113.0 lb

## 2023-02-26 DIAGNOSIS — C3412 Malignant neoplasm of upper lobe, left bronchus or lung: Secondary | ICD-10-CM | POA: Insufficient documentation

## 2023-02-26 NOTE — Progress Notes (Signed)
Radiation Oncology Follow up Note  Name: Denise Allen   Date:   02/26/2023 MRN:  [id number] DOB: [birth year]    This 72 y.o. 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  She is seen today in routine follow-up she is doing well specifically denies any change in her pulmonary status she does have a cough which seems to be subsiding.  No hemoptysis chest tightness or dysphagia..  COMPLICATIONS OF TREATMENT: none  FOLLOW UP COMPLIANCE: keeps appointments   PHYSICAL EXAM:  BP 122/71   Pulse 68   Temp (!) 97.1 F (36.2 C) (Tympanic)   Resp 16   Wt 113 lb (51.3 kg)   BMI 20.02 kg/m  Well-developed well-nourished patient in NAD. HEENT reveals PERLA, EOMI, discs not visualized.  Oral cavity is clear. No oral mucosal lesions are identified. Neck is clear without evidence of cervical or supraclavicular adenopathy. Lungs are clear to A&P. Cardiac examination is essentially unremarkable with regular rate and rhythm without murmur rub or thrill. Abdomen is benign with no organomegaly or masses noted. Motor sensory and DTR levels are equal and symmetric in the upper and lower extremities. Cranial nerves II through XII are grossly intact. Proprioception is intact. No peripheral adenopathy or edema is identified. No motor or sensory levels are noted. Crude visual fields are within normal range.  RADIOLOGY RESULTS: CT scan of the chest ordered  PLAN: Present time patient is doing well very low side effect profile from SBRT.  Of asked to see her back in 3 months for follow-up.  She already has a follow-up CT scan in October which I will review when that becomes available.  Patient knows to call sooner with any concerns.  I would like to take  this opportunity to thank you for allowing me to participate in the care of your patient.Carmina Miller, MD

## 2023-04-01 NOTE — Progress Notes (Deleted)
[id number], Denise Gandy, MD (last seen 04/24) Primary Cardiologist: none  HPI:  [name] is a 72 y/o female with a history of COPD, thyroid disease, lung cancer (diagnosed 05/24), tobacco use and chronic heart failure.   Admitted 06/12/22 due to COPD/ HF exacerbation.   Echo 06/12/22: EF of 40-45% along with mild/ moderate LVH and mild/ moderate MR/TR.   She presents today for a HF follow-up visit with a chief complaint of   At last visit, her entresto was increased to 97/103mg  BID.  ROS: All systems negative except as listed in HPI, PMH and Problem List.  SH:  Social History   Socioeconomic History   Marital status: Married    Spouse name: Reuel Boom Higashi   Number of children: Not on file   Years of education: Not on file   Highest education level: Not on file  Occupational History   Not on file  Tobacco Use   Smoking status: Every Day    Current packs/day: 1.00    Average packs/day: 1 pack/day for 53.0 years (53.0 ttl pk-yrs)    Types: Cigarettes    Passive exposure: Current   Smokeless tobacco: Never   Tobacco comments:    1 PDD- khj 01/13/2023  Vaping Use   Vaping status: Never Used  Substance and Sexual Activity   Alcohol use: Yes    Alcohol/week: 6.0 standard drinks of alcohol    Types: 6 Glasses of wine per week    Comment: wine coolers   Drug use: Never   Sexual activity: Yes  Other Topics Concern   Not on file  Social History Narrative   Not on file   Social Determinants of Health   Financial Resource Strain: Not on file  Food Insecurity: No Food Insecurity (01/08/2023)   Hunger Vital Sign    Worried About Running Out of Food in the Last Year: Never true    Ran Out of Food in the Last Year: Never true  Transportation Needs: No Transportation Needs (01/08/2023)   PRAPARE - Administrator, Civil Service (Medical): No    Lack of Transportation (Non-Medical): No  Physical Activity: Not on file  Stress: Not on file  Social Connections: Unknown  (01/08/2023)   Social Connection and Isolation Panel [NHANES]    Frequency of Communication with Friends and Family: Never    Frequency of Social Gatherings with Friends and Family: Not on file    Attends Religious Services: Not on file    Active Member of Clubs or Organizations: Not on file    Attends Banker Meetings: Not on file    Marital Status: Not on file  Intimate Partner Violence: Not At Risk (01/08/2023)   Humiliation, Afraid, Rape, and Kick questionnaire    Fear of Current or Ex-Partner: No    Emotionally Abused: No    Physically Abused: No    Sexually Abused: No    FH:  Family History  Problem Relation Age of Onset   Cervical cancer Mother    Thyroid disease Mother    Prostate cancer Father    Cerebral aneurysm Sister     Past Medical History:  Diagnosis Date   Anemia    Arthritis    CHF (congestive heart failure) (HCC) 05/2022   Chronic cough    COPD (chronic obstructive pulmonary disease) (HCC)    Dupuytren contracture    Hypothyroidism    Lung cancer (HCC)     Current Outpatient Medications  Medication Sig Dispense Refill  albuterol (PROVENTIL) (2.5 MG/3ML) 0.083% nebulizer solution Take 3 mLs (2.5 mg total) by nebulization every 4 (four) hours as needed for wheezing or shortness of breath. 75 mL 12   albuterol (VENTOLIN HFA) 108 (90 Base) MCG/ACT inhaler Inhale 2 puffs into the lungs every 6 (six) hours as needed for wheezing or shortness of breath (COPD).     aspirin EC 81 MG tablet Take 1 tablet (81 mg total) by mouth daily. Swallow whole. 30 tablet 12   atorvastatin (LIPITOR) 40 MG tablet Take 1 tablet (40 mg total) by mouth daily. 30 tablet 5   budesonide-formoterol (SYMBICORT) 80-4.5 MCG/ACT inhaler Inhale 2 puffs into the lungs 2 (two) times daily. (Patient taking differently: Inhale 2 puffs into the lungs at bedtime. Additional  2 puff in the morning if humid) 3 each 3   cyanocobalamin (VITAMIN B12) 1000 MCG tablet Take 1 tablet (1,000  mcg total) by mouth daily. 30 tablet 5   empagliflozin (JARDIANCE) 10 MG TABS tablet Take 1 tablet (10 mg total) by mouth daily before breakfast. 90 tablet 3   furosemide (LASIX) 40 MG tablet Take 1 tablet (40 mg total) by mouth daily. (Patient taking differently: Take 40 mg by mouth daily as needed for fluid.) 30 tablet 0   levothyroxine (SYNTHROID) 75 MCG tablet Take 75 mcg by mouth daily before breakfast.     metoprolol succinate (TOPROL-XL) 50 MG 24 hr tablet Take 1 tablet (50 mg total) by mouth daily. Take with or immediately following a meal. 90 tablet 3   sacubitril-valsartan (ENTRESTO) 97-103 MG Take 1 tablet by mouth 2 (two) times daily. 180 tablet 3   spironolactone (ALDACTONE) 25 MG tablet Take 1 tablet (25 mg total) by mouth daily. 90 tablet 3   Tiotropium Bromide Monohydrate (SPIRIVA RESPIMAT) 2.5 MCG/ACT AERS Inhale 2 puffs into the lungs daily.     No current facility-administered medications for this visit.   Facility-Administered Medications Ordered in Other Visits  Medication Dose Route Frequency Provider Last Rate Last Admin   albuterol (PROVENTIL) (2.5 MG/3ML) 0.083% nebulizer solution 2.5 mg  2.5 mg Nebulization Once Raechel Chute, MD          PHYSICAL EXAM:  General:  Well appearing. No resp difficulty HEENT: normal Neck: supple. JVP flat. No lymphadenopathy or thryomegaly appreciated. Cor: PMI normal. Regular rate & rhythm. No rubs, gallops or murmurs. Lungs: clear Abdomen: soft, nontender, nondistended. No hepatosplenomegaly. No bruits or masses.  Extremities: no cyanosis, clubbing, rash, edema Neuro: alert & oriented x3, cranial nerves grossly intact. Moves all 4 extremities w/o difficulty. Affect pleasant.   ECG: not done   ASSESSMENT & PLAN:   1: Chronic heart failure with reduced ejection fraction- - suspect due to COPD/ anemia - NYHA class II - euvolemic today - weighing daily; reminded to call for an overnight weight gain of > 2 pounds or a  weekly weight gain of > 5 pounds - weight 110.6 pounds from last visit here 1 month ago - Echo 06/12/22: EF of 40-45% along with mild/ moderate LVH and mild/ moderate MR/TR - plan to update echo later this year  - adding "very little" salt and says that she's cut back a lot and is working on not adding any salt - drinking 3-4 glass of water and 2 cups of coffee and 1 wine cooler daily - continue jardiance 10mg  daily - continue entresto 97/103mg  BID - continue metoprolol succinate 50mg  daily - continue spironolactone 25mg  daily - BMP today - BNP 06/12/22 was  1015.7  2: COPD- - saw pulmonology (Dgayli) 07/24 - symbicort daily along with PRN abuterol - BMP 12/25/22 showed sodium 139, potassium 3.7, creatinine 0.7 & GFR >60  3: Tobacco use- - has been smoking since she was 72 y/o - continues to smoke 1 ppd & has no desire to quit  4: Anemia- - hemoglobin 11/29/22 was 13.2 - saw PCP Letitia Libra) 04/24  5: Lung cancer- - biopsy done 11/29/22 on nodule in LUL - PET scan done 12/18/22 - saw oncology Donneta Romberg) 06/24 - saw thoracic surgeon Cliffton Asters) 06/24; not surgical candidate due to recent PFT's and continue tobacco use - has finished radiation treatment - has upcoming CT 05/01/23

## 2023-04-02 ENCOUNTER — Encounter: Payer: Medicare Other | Admitting: Family

## 2023-04-06 ENCOUNTER — Other Ambulatory Visit: Payer: Self-pay | Admitting: Family

## 2023-04-17 ENCOUNTER — Ambulatory Visit: Payer: Medicare Other | Attending: Family | Admitting: Family

## 2023-04-17 ENCOUNTER — Encounter: Payer: Self-pay | Admitting: Family

## 2023-04-17 ENCOUNTER — Other Ambulatory Visit (HOSPITAL_COMMUNITY): Payer: Self-pay

## 2023-04-17 VITALS — BP 106/55 | HR 86 | Wt 114.0 lb

## 2023-04-17 DIAGNOSIS — I509 Heart failure, unspecified: Secondary | ICD-10-CM | POA: Diagnosis present

## 2023-04-17 DIAGNOSIS — J449 Chronic obstructive pulmonary disease, unspecified: Secondary | ICD-10-CM | POA: Diagnosis present

## 2023-04-17 DIAGNOSIS — C3412 Malignant neoplasm of upper lobe, left bronchus or lung: Secondary | ICD-10-CM | POA: Diagnosis not present

## 2023-04-17 DIAGNOSIS — E039 Hypothyroidism, unspecified: Secondary | ICD-10-CM | POA: Diagnosis not present

## 2023-04-17 DIAGNOSIS — D649 Anemia, unspecified: Secondary | ICD-10-CM

## 2023-04-17 DIAGNOSIS — F17210 Nicotine dependence, cigarettes, uncomplicated: Secondary | ICD-10-CM | POA: Insufficient documentation

## 2023-04-17 DIAGNOSIS — I5022 Chronic systolic (congestive) heart failure: Secondary | ICD-10-CM

## 2023-04-17 DIAGNOSIS — Z720 Tobacco use: Secondary | ICD-10-CM

## 2023-04-17 DIAGNOSIS — E079 Disorder of thyroid, unspecified: Secondary | ICD-10-CM | POA: Diagnosis present

## 2023-04-17 NOTE — Progress Notes (Signed)
[id number], [name], MD (last seen 010/24) Primary Cardiologist: none  HPI:  [name] is a 72 y/o female with a history of COPD, thyroid disease, lung cancer (diagnosed 05/24), tobacco use and chronic heart failure.   Admitted 06/12/22 due to COPD/ HF exacerbation.   Echo 06/12/22: EF of 40-45% along with mild/ moderate LVH and mild/ moderate MR/TR.   She presents today for a HF follow-up visit with a chief complaint of minimal shortness of breath. Has associated dizziness with sudden position changes, intermittent tingling in both hands, decreased appetite, nasal congestion, rhinorrhea and chronic difficulty sleeping along with this. Denies chest pain, cough, palpitations, abdominal distention, pedal edema or weight gain.   Saw PCP earlier today and will be starting on prednisone to decrease some potential inflammation in her neck that is causing intermittent tingling in both of her hands. If symptoms persist, plan is to get xrays ordered.   At last visit, her entresto was increased to 97/103mg  BID. She doesn't notice any issues with taking this higher dose.   Not interested in flu vaccine.   ROS: All systems negative except as listed in HPI, PMH and Problem List.  SH:  Social History   Socioeconomic History   Marital status: Married    Spouse name: Denise Allen   Number of children: Not on file   Years of education: Not on file   Highest education level: Not on file  Occupational History   Not on file  Tobacco Use   Smoking status: Every Day    Current packs/day: 1.00    Average packs/day: 1 pack/day for 53.0 years (53.0 ttl pk-yrs)    Types: Cigarettes    Passive exposure: Current   Smokeless tobacco: Never   Tobacco comments:    1 PDD- khj 01/13/2023  Vaping Use   Vaping status: Never Used  Substance and Sexual Activity   Alcohol use: Yes    Alcohol/week: 6.0 standard drinks of alcohol    Types: 6 Glasses of wine per week    Comment: wine coolers   Drug use: Never    Sexual activity: Yes  Other Topics Concern   Not on file  Social History Narrative   Not on file   Social Determinants of Health   Financial Resource Strain: Not on file  Food Insecurity: No Food Insecurity (01/08/2023)   Hunger Vital Sign    Worried About Running Out of Food in the Last Year: Never true    Ran Out of Food in the Last Year: Never true  Transportation Needs: No Transportation Needs (01/08/2023)   PRAPARE - Administrator, Civil Service (Medical): No    Lack of Transportation (Non-Medical): No  Physical Activity: Not on file  Stress: Not on file  Social Connections: Unknown (01/08/2023)   Social Connection and Isolation Panel [NHANES]    Frequency of Communication with Friends and Family: Never    Frequency of Social Gatherings with Friends and Family: Not on file    Attends Religious Services: Not on file    Active Member of Clubs or Organizations: Not on file    Attends Banker Meetings: Not on file    Marital Status: Not on file  Intimate Partner Violence: Not At Risk (01/08/2023)   Humiliation, Afraid, Rape, and Kick questionnaire    Fear of Current or Ex-Partner: No    Emotionally Abused: No    Physically Abused: No    Sexually Abused: No    FH:  Family History  Problem Relation Age of Onset   Cervical cancer Mother    Thyroid disease Mother    Prostate cancer Father    Cerebral aneurysm Sister     Past Medical History:  Diagnosis Date   Anemia    Arthritis    CHF (congestive heart failure) (HCC) 05/2022   Chronic cough    COPD (chronic obstructive pulmonary disease) (HCC)    Dupuytren contracture    Hypothyroidism    Lung cancer (HCC)     Current Outpatient Medications  Medication Sig Dispense Refill   albuterol (PROVENTIL) (2.5 MG/3ML) 0.083% nebulizer solution Take 3 mLs (2.5 mg total) by nebulization every 4 (four) hours as needed for wheezing or shortness of breath. 75 mL 12   albuterol (VENTOLIN HFA) 108 (90  Base) MCG/ACT inhaler Inhale 2 puffs into the lungs every 6 (six) hours as needed for wheezing or shortness of breath (COPD).     aspirin EC 81 MG tablet Take 1 tablet (81 mg total) by mouth daily. Swallow whole. 30 tablet 12   atorvastatin (LIPITOR) 40 MG tablet TAKE 1 TABLET BY MOUTH EVERY DAY 90 tablet 1   budesonide-formoterol (SYMBICORT) 80-4.5 MCG/ACT inhaler Inhale 2 puffs into the lungs 2 (two) times daily. (Patient taking differently: Inhale 2 puffs into the lungs at bedtime. Additional  2 puff in the morning if humid) 3 each 3   cyanocobalamin (VITAMIN B12) 1000 MCG tablet Take 1 tablet (1,000 mcg total) by mouth daily. 30 tablet 5   empagliflozin (JARDIANCE) 10 MG TABS tablet Take 1 tablet (10 mg total) by mouth daily before breakfast. 90 tablet 3   furosemide (LASIX) 40 MG tablet Take 1 tablet (40 mg total) by mouth daily. (Patient taking differently: Take 40 mg by mouth daily as needed for fluid.) 30 tablet 0   levothyroxine (SYNTHROID) 75 MCG tablet Take 75 mcg by mouth daily before breakfast.     metoprolol succinate (TOPROL-XL) 50 MG 24 hr tablet Take 1 tablet (50 mg total) by mouth daily. Take with or immediately following a meal. 90 tablet 3   sacubitril-valsartan (ENTRESTO) 97-103 MG Take 1 tablet by mouth 2 (two) times daily. 180 tablet 3   spironolactone (ALDACTONE) 25 MG tablet Take 1 tablet (25 mg total) by mouth daily. 90 tablet 3   Tiotropium Bromide Monohydrate (SPIRIVA RESPIMAT) 2.5 MCG/ACT AERS Inhale 2 puffs into the lungs daily.     No current facility-administered medications for this visit.   Facility-Administered Medications Ordered in Other Visits  Medication Dose Route Frequency Provider Last Rate Last Admin   albuterol (PROVENTIL) (2.5 MG/3ML) 0.083% nebulizer solution 2.5 mg  2.5 mg Nebulization Once Raechel Chute, MD       Vitals:   04/17/23 1302  BP: (!) 106/55  Pulse: 86  Weight: 114 lb (51.7 kg)   Wt Readings from Last 3 Encounters:  04/17/23 114 lb  (51.7 kg)  02/26/23 113 lb (51.3 kg)  02/25/23 110 lb 6 oz (50.1 kg)   Lab Results  Component Value Date   CREATININE 0.70 12/25/2022   CREATININE 0.66 11/29/2022   CREATININE 0.68 09/13/2022    PHYSICAL EXAM:  General:  Well appearing. No resp difficulty HEENT: normal Neck: supple. JVP flat. No lymphadenopathy or thryomegaly appreciated. Cor: PMI normal. Regular rate & rhythm. No rubs, gallops or murmurs. Lungs: clear Abdomen: soft, nontender, nondistended. No hepatosplenomegaly. No bruits or masses.  Extremities: no cyanosis, clubbing, rash, edema Neuro: alert & oriented x3, cranial nerves grossly  intact. Moves all 4 extremities w/o difficulty. Affect pleasant.   ECG: not done   ASSESSMENT & PLAN:   1: Chronic heart failure with reduced ejection fraction- - suspect due to COPD/ anemia - NYHA class II - euvolemic today - weighing daily; reminded to call for an overnight weight gain of > 2 pounds or a weekly weight gain of > 5 pounds - weight up 4 pounds from last visit here 1 month ago - Echo 06/12/22: EF of 40-45% along with mild/ moderate LVH and mild/ moderate MR/TR - plan to schedule updated echo at next visit - adding "very little" salt and says that she's cut back a lot and is working on not adding any salt - drinking 3-4 glass of water and 2 cups of coffee and 1 wine cooler daily - continue jardiance 10mg  daily - continue entresto 97/103mg  BID - continue metoprolol succinate 50mg  daily; will not titrate today due to intermittent dizziness - continue spironolactone 25mg  daily - BNP 06/12/22 was 1015.7  2: COPD- - saw pulmonology (Dgayli) 07/24 - symbicort daily along with PRN abuterol - BMP 04/10/23 showed sodium 140, potassium 4.5, creatinine 0.9 & GFR 67  3: Tobacco use- - has been smoking since she was 72 y/o - continues to smoke 1 ppd & has no desire to quit  4: Anemia- - hemoglobin 11/29/22 was 13.2 - saw PCP Letitia Libra) 04/24  5: Lung cancer- -  biopsy done 11/29/22 on nodule in LUL - PET scan done 12/18/22 - saw oncology Donneta Romberg) 06/24 - saw thoracic surgeon Cliffton Asters) 06/24; not surgical candidate due to recent PFT's and continued tobacco use - has finished radiation treatment - has upcoming CT 05/01/23  6: Hypothyroidism- - TSH 04/10/23 was 9.219 - PCP is rechecking this in 1 month - currently taking levothyroxine 75 mcg daily   Return in 1 month, sooner if needed.

## 2023-04-21 ENCOUNTER — Telehealth (HOSPITAL_COMMUNITY): Payer: Self-pay

## 2023-04-21 ENCOUNTER — Other Ambulatory Visit (HOSPITAL_COMMUNITY): Payer: Self-pay

## 2023-04-21 MED ORDER — EMPAGLIFLOZIN 10 MG PO TABS: 10.0000 mg | ORAL_TABLET | Freq: Every day | ORAL | 3 refills | Status: DC

## 2023-04-21 MED ORDER — SACUBITRIL-VALSARTAN 97-103 MG PO TABS: 1.0000 | ORAL_TABLET | Freq: Two times a day (BID) | ORAL | 3 refills | Status: DC

## 2023-04-21 NOTE — Telephone Encounter (Signed)
Advanced Heart Failure Patient Advocate Encounter  The patient was approved for a [employer] grant that will help cover the cost of Entresto, Jardiance, Metoprolol, Spironolactone. Total amount awarded, $10,000.  Effective: 03/22/2023 - 03/20/2024.  BIN [id number] PCN [id number] [id number] ID [id number]  Pharmacy provided with approval and processing information. Patient informed via phone.  Burnell Blanks, CPhT Rx Patient Advocate Phone: [phone]

## 2023-05-01 ENCOUNTER — Ambulatory Visit
Admission: RE | Admit: 2023-05-01 | Discharge: 2023-05-01 | Disposition: A | Discharge status: Home / Self Care | Payer: Medicare Other | Source: Ambulatory Visit | Attending: Internal Medicine | Admitting: Internal Medicine

## 2023-05-01 DIAGNOSIS — C3412 Malignant neoplasm of upper lobe, left bronchus or lung: Secondary | ICD-10-CM | POA: Insufficient documentation

## 2023-05-08 ENCOUNTER — Inpatient Hospital Stay: Payer: Medicare Other | Attending: Internal Medicine | Admitting: Nurse Practitioner

## 2023-05-08 ENCOUNTER — Encounter: Payer: Self-pay | Admitting: Nurse Practitioner

## 2023-05-08 VITALS — BP 102/62 | HR 70 | Temp 97.3°F | Resp 20 | Wt 114.8 lb

## 2023-05-08 DIAGNOSIS — Z85118 Personal history of other malignant neoplasm of bronchus and lung: Secondary | ICD-10-CM

## 2023-05-08 DIAGNOSIS — Z79899 Other long term (current) drug therapy: Secondary | ICD-10-CM | POA: Insufficient documentation

## 2023-05-08 DIAGNOSIS — Z7951 Long term (current) use of inhaled steroids: Secondary | ICD-10-CM | POA: Diagnosis not present

## 2023-05-08 DIAGNOSIS — Z08 Encounter for follow-up examination after completed treatment for malignant neoplasm: Secondary | ICD-10-CM | POA: Diagnosis not present

## 2023-05-08 DIAGNOSIS — C3412 Malignant neoplasm of upper lobe, left bronchus or lung: Secondary | ICD-10-CM | POA: Insufficient documentation

## 2023-05-08 DIAGNOSIS — Z7982 Long term (current) use of aspirin: Secondary | ICD-10-CM | POA: Diagnosis not present

## 2023-05-08 DIAGNOSIS — F17210 Nicotine dependence, cigarettes, uncomplicated: Secondary | ICD-10-CM | POA: Diagnosis not present

## 2023-05-08 NOTE — Progress Notes (Signed)
[facility] CONSULT NOTE  Patient Care Team: Gracelyn Nurse, MD as PCP - General (Internal Medicine) Glory Buff, RN as Oncology Nurse Navigator Earna Coder, MD as Consulting Physician (Oncology)  CHIEF COMPLAINTS/PURPOSE OF CONSULTATION: lung cancer  Oncology History Overview Note  IMPRESSION: 1. Solid 7 mm pulmonary nodule of the left upper lobe, unchanged in size when compared with the recent prior exam. 2. Severe coronary artery calcifications. 3. Aortic Atherosclerosis (ICD10-I70.0) and Emphysema (ICD10-J43.9).     Electronically Signed   By: Allegra Lai M.D.   On: 11/30/2022 18:21   Primary cancer of left upper lobe of lung (HCC)  12/05/2022 Initial Diagnosis   Primary cancer of left upper lobe of lung (HCC)   12/05/2022 Cancer Staging   Staging form: Lung, AJCC 8th Edition - Clinical: Stage IA1 (cT1a, cN0, cM0) - Signed by Earna Coder, MD on 12/05/2022     HISTORY OF PRESENTING ILLNESS: Patient ambulating-independently. Alone.  Denise Allen 72 y.o. female with history of smoking and COPD is here for follow up after completing radiation for lung cancer. She has chronic shortness of breath and cough which is unchanged. Denies unintentional weight loss or chest pain. No headaches or hemoptysis. Denies other complaints.   Review of Systems  Constitutional:  Positive for malaise/fatigue. Negative for chills, diaphoresis, fever and weight loss.  HENT:  Negative for nosebleeds and sore throat.   Eyes:  Negative for blurred vision.  Respiratory:  Positive for cough and shortness of breath. Negative for hemoptysis, sputum production and wheezing.   Cardiovascular:  Negative for chest pain, palpitations, orthopnea and leg swelling.  Gastrointestinal:  Negative for abdominal pain, blood in stool, constipation, diarrhea, heartburn, melena, nausea and vomiting.  Genitourinary:  Negative for dysuria, frequency, hematuria and urgency.   Musculoskeletal:  Positive for joint pain. Negative for back pain and falls.  Skin:  Negative for itching and rash.  Neurological:  Negative for dizziness, tingling, focal weakness, loss of consciousness, weakness and headaches.  Endo/Heme/Allergies:  Does not bruise/bleed easily.  Psychiatric/Behavioral:  Negative for depression. The patient is not nervous/anxious and does not have insomnia.      MEDICAL HISTORY:  Past Medical History:  Diagnosis Date   Anemia    Arthritis    CHF (congestive heart failure) (HCC) 05/2022   Chronic cough    COPD (chronic obstructive pulmonary disease) (HCC)    Dupuytren contracture    Hypothyroidism    Lung cancer (HCC)     SURGICAL HISTORY: Past Surgical History:  Procedure Laterality Date   BREAST EXCISIONAL BIOPSY Left 90s   benign   BRONCHIAL BIOPSY  11/29/2022   Procedure: BRONCHIAL BIOPSIES;  Surgeon: Raechel Chute, MD;  Location: [facility];  Service: Pulmonary;;   BRONCHIAL NEEDLE ASPIRATION BIOPSY  11/29/2022   Procedure: BRONCHIAL NEEDLE ASPIRATION BIOPSIES;  Surgeon: Raechel Chute, MD;  Location: [facility];  Service: Pulmonary;;   COLONOSCOPY  07/02/2007   COLONOSCOPY N/A 05/02/2021   Procedure: COLONOSCOPY;  Surgeon: Toledo, Boykin Nearing, MD;  Location: [facility]COPY;  Service: Gastroenterology;  Laterality: N/A;   ENDOBRONCHIAL ULTRASOUND  11/29/2022   Procedure: ENDOBRONCHIAL ULTRASOUND;  Surgeon: Raechel Chute, MD;  Location: [facility];  Service: Pulmonary;;   MASTECTOMY PARTIAL / LUMPECTOMY Left    benign   TUBAL LIGATION  1976    SOCIAL HISTORY: Social History   Socioeconomic History   Marital status: Married    Spouse name: Lavasha Stache   Number of children: Not on file  Years of education: Not on file   Highest education level: Not on file  Occupational History   Not on file  Tobacco Use   Smoking status: Every Day    Current packs/day: 1.00    Average packs/day: 1 pack/day for 53.0 years (53.0 ttl pk-yrs)     Types: Cigarettes    Passive exposure: Current   Smokeless tobacco: Never   Tobacco comments:    1 PDD- khj 01/13/2023  Vaping Use   Vaping status: Never Used  Substance and Sexual Activity   Alcohol use: Yes    Alcohol/week: 6.0 standard drinks of alcohol    Types: 6 Glasses of wine per week    Comment: wine coolers   Drug use: Never   Sexual activity: Yes  Other Topics Concern   Not on file  Social History Narrative   Not on file   Social Determinants of Health   Financial Resource Strain: Not on file  Food Insecurity: No Food Insecurity (01/08/2023)   Hunger Vital Sign    Worried About Running Out of Food in the Last Year: Never true    Ran Out of Food in the Last Year: Never true  Transportation Needs: No Transportation Needs (01/08/2023)   PRAPARE - Administrator, Civil Service (Medical): No    Lack of Transportation (Non-Medical): No  Physical Activity: Not on file  Stress: Not on file  Social Connections: Unknown (01/08/2023)   Social Connection and Isolation Panel [NHANES]    Frequency of Communication with Friends and Family: Never    Frequency of Social Gatherings with Friends and Family: Not on file    Attends Religious Services: Not on file    Active Member of Clubs or Organizations: Not on file    Attends Banker Meetings: Not on file    Marital Status: Not on file  Intimate Partner Violence: Not At Risk (01/08/2023)   Humiliation, Afraid, Rape, and Kick questionnaire    Fear of Current or Ex-Partner: No    Emotionally Abused: No    Physically Abused: No    Sexually Abused: No    FAMILY HISTORY: Family History  Problem Relation Age of Onset   Cervical cancer Mother    Thyroid disease Mother    Prostate cancer Father    Cerebral aneurysm Sister     ALLERGIES:  is allergic to penicillins.  MEDICATIONS:  Current Outpatient Medications  Medication Sig Dispense Refill   albuterol (PROVENTIL) (2.5 MG/3ML) 0.083% nebulizer  solution Take 3 mLs (2.5 mg total) by nebulization every 4 (four) hours as needed for wheezing or shortness of breath. 75 mL 12   albuterol (VENTOLIN HFA) 108 (90 Base) MCG/ACT inhaler Inhale 2 puffs into the lungs every 6 (six) hours as needed for wheezing or shortness of breath (COPD).     aspirin EC 81 MG tablet Take 1 tablet (81 mg total) by mouth daily. Swallow whole. 30 tablet 12   atorvastatin (LIPITOR) 40 MG tablet TAKE 1 TABLET BY MOUTH EVERY DAY 90 tablet 1   budesonide-formoterol (SYMBICORT) 80-4.5 MCG/ACT inhaler Inhale 2 puffs into the lungs 2 (two) times daily. (Patient taking differently: Inhale 2 puffs into the lungs at bedtime. Additional  2 puff in the morning if humid) 3 each 3   cyanocobalamin (VITAMIN B12) 1000 MCG tablet Take 1 tablet (1,000 mcg total) by mouth daily. 30 tablet 5   empagliflozin (JARDIANCE) 10 MG TABS tablet Take 1 tablet (10 mg total) by mouth daily  before breakfast. 90 tablet 3   furosemide (LASIX) 40 MG tablet Take 1 tablet (40 mg total) by mouth daily. (Patient taking differently: Take 40 mg by mouth daily as needed for fluid.) 30 tablet 0   levothyroxine (SYNTHROID) 75 MCG tablet Take 75 mcg by mouth daily before breakfast.     metoprolol succinate (TOPROL-XL) 50 MG 24 hr tablet Take 1 tablet (50 mg total) by mouth daily. Take with or immediately following a meal. 90 tablet 3   sacubitril-valsartan (ENTRESTO) 97-103 MG Take 1 tablet by mouth 2 (two) times daily. 180 tablet 3   spironolactone (ALDACTONE) 25 MG tablet Take 1 tablet (25 mg total) by mouth daily. 90 tablet 3   Tiotropium Bromide Monohydrate (SPIRIVA RESPIMAT) 2.5 MCG/ACT AERS Inhale 2 puffs into the lungs daily.     No current facility-administered medications for this visit.   Facility-Administered Medications Ordered in Other Visits  Medication Dose Route Frequency Provider Last Rate Last Admin   albuterol (PROVENTIL) (2.5 MG/3ML) 0.083% nebulizer solution 2.5 mg  2.5 mg Nebulization Once  Raechel Chute, MD        PHYSICAL EXAMINATION: Vitals:   05/08/23 1022  BP: 102/62  Pulse: 70  Resp: 20  Temp: (!) 97.3 F (36.3 C)  SpO2: 100%   Filed Weights   05/08/23 1022  Weight: 114 lb 12.8 oz (52.1 kg)    Physical Exam Vitals reviewed.  Constitutional:      Appearance: She is not ill-appearing.  HENT:     Head: Normocephalic and atraumatic.  Cardiovascular:     Rate and Rhythm: Normal rate and regular rhythm.  Pulmonary:     Effort: No respiratory distress.     Comments: Decreased breath sounds bilaterally.  Abdominal:     General: There is no distension.     Palpations: Abdomen is soft.  Lymphadenopathy:     Cervical: No cervical adenopathy.  Skin:    General: Skin is warm.     Coloration: Skin is not pale.  Neurological:     Mental Status: She is alert and oriented to person, place, and time.  Psychiatric:        Mood and Affect: Mood normal.        Behavior: Behavior normal.    LABORATORY DATA:  No labs on site.    RADIOGRAPHIC STUDIES: I have personally reviewed the radiological images as listed and agreed with the findings in the report. CT Chest Wo Contrast  Result Date: 05/08/2023 CLINICAL DATA:  Non-small-cell lung cancer. Restaging. * Tracking Code: BO * EXAM: CT CHEST WITHOUT CONTRAST TECHNIQUE: Multidetector CT imaging of the chest was performed following the standard protocol without IV contrast. RADIATION DOSE REDUCTION: This exam was performed according to the departmental dose-optimization program which includes automated exposure control, adjustment of the mA and/or kV according to patient size and/or use of iterative reconstruction technique. COMPARISON:  PET-CT 12/18/2018 FINDINGS: Cardiovascular: The heart size is normal. No substantial pericardial effusion. Coronary artery calcification is evident. Moderate atherosclerotic calcification is noted in the wall of the thoracic aorta. Mediastinum/Nodes: No mediastinal lymphadenopathy. No  evidence for gross hilar lymphadenopathy although assessment is limited by the lack of intravenous contrast on the current study. The esophagus has normal imaging features. There is no axillary lymphadenopathy. Lungs/Pleura: Centrilobular and paraseptal emphysema evident. Stable biapical pleuroparenchymal scarring. 5 mm left upper lobe pulmonary nodule measured previously is 4 mm today on image 49/3. No new suspicious pulmonary nodule or mass. No focal airspace consolidation. No pleural  effusion. Upper Abdomen: Stable exophytic right renal cyst. Tiny nonobstructing stones again noted right kidney. Musculoskeletal: No worrisome lytic or sclerotic osseous abnormality. IMPRESSION: 1. 5 mm left upper lobe pulmonary nodule measured previously is 4 mm today. No new or progressive findings on today's study. 2. Nonobstructing right nephrolithiasis. 3. Aortic Atherosclerosis (ICD10-I70.0) and Emphysema (ICD10-J43.9). Electronically Signed   By: Kennith Center M.D.   On: 05/08/2023 09:22    ASSESSMENT & PLAN:   # Primary cancer of left upper lobe of lung - June 2024- CT [screening] Solid 7 mm pulmonary nodule of the left upper lobe [s/p Bronch- Dr.Dgyali]- SQUAMOUS CELL CA- clinically stage 1. 11/2022- PET - Borderline hypermetabolic Left upper lobe nodule measuring 5 mm with SUV 1.9. No other nodules. & incidentals as below. Felt not to be a good candidate for surgery based on her age and medical comorbidities. SBRT received 01/23/23-01/29/23. Tolerated well. CT Chest wo contrast reviewed. LUL nodule measures roughly the same as previous. Now 5mm, previously 4 mm. No new or progressive findings. Clinically asymptomatic of recurrence. Continue surveillance.     # Active smoker: Declined any smoking cessation counseling.   # COPD- clinically stable. Followed by Dr. Aundria Rud. Encouraged continued follow up.    # Incidental findings on Imaging: Oct 2024- nonobstructing renal stone. Jul 2024- left adrenal gland nodule  measuring 1.5 cm, consistent with benign adenoma,Severe coronary artery calcifications. 3. Aortic Atherosclerosis and Emphysema (I reviewed/discussed/counseled the patient.     DISPOSITION: 3 mo- ct chest  Week later- see Dr Donneta Romberg- la  No problem-specific Assessment & Plan notes found for this encounter.  All questions were answered. The patient knows to call the clinic with any problems, questions or concerns.  Alinda Dooms, NP 05/08/2023

## 2023-05-19 ENCOUNTER — Encounter: Payer: Self-pay | Admitting: Family

## 2023-05-19 ENCOUNTER — Ambulatory Visit: Payer: Medicare Other | Attending: Family | Admitting: Family

## 2023-05-19 VITALS — BP 117/59 | HR 73 | Wt 114.0 lb

## 2023-05-19 DIAGNOSIS — I5022 Chronic systolic (congestive) heart failure: Secondary | ICD-10-CM | POA: Insufficient documentation

## 2023-05-19 DIAGNOSIS — F17210 Nicotine dependence, cigarettes, uncomplicated: Secondary | ICD-10-CM | POA: Diagnosis not present

## 2023-05-19 DIAGNOSIS — D649 Anemia, unspecified: Secondary | ICD-10-CM | POA: Diagnosis not present

## 2023-05-19 DIAGNOSIS — Z923 Personal history of irradiation: Secondary | ICD-10-CM | POA: Insufficient documentation

## 2023-05-19 DIAGNOSIS — E039 Hypothyroidism, unspecified: Secondary | ICD-10-CM | POA: Insufficient documentation

## 2023-05-19 DIAGNOSIS — Z720 Tobacco use: Secondary | ICD-10-CM | POA: Diagnosis not present

## 2023-05-19 DIAGNOSIS — Z79890 Hormone replacement therapy (postmenopausal): Secondary | ICD-10-CM | POA: Diagnosis not present

## 2023-05-19 DIAGNOSIS — J449 Chronic obstructive pulmonary disease, unspecified: Secondary | ICD-10-CM | POA: Insufficient documentation

## 2023-05-19 DIAGNOSIS — Z79899 Other long term (current) drug therapy: Secondary | ICD-10-CM | POA: Diagnosis not present

## 2023-05-19 DIAGNOSIS — C3490 Malignant neoplasm of unspecified part of unspecified bronchus or lung: Secondary | ICD-10-CM | POA: Diagnosis not present

## 2023-05-19 DIAGNOSIS — C3412 Malignant neoplasm of upper lobe, left bronchus or lung: Secondary | ICD-10-CM

## 2023-05-19 NOTE — Progress Notes (Signed)
[id number], Denise Gandy, MD (last seen 10/24) Primary Cardiologist: none  HPI:  [name] is a 72 y/o female with a history of COPD, thyroid disease, lung cancer (diagnosed 05/24), tobacco use and chronic heart failure.   Admitted 06/12/22 due to COPD/ HF exacerbation.   Echo 06/12/22: EF of 40-45% along with mild/ moderate LVH and mild/ moderate MR/TR.   She presents today for a HF follow-up visit with a chief complaint of minimal shortness of breath with moderate exertion. Chronic in nature. Has associated intermittent cough, occasinal left ankle swelling and occasional dizziness along with this. Denies fatigue, chest pain, palpitations, abdominal distention, weight gain or difficulty sleeping. Did notice an increase in ankle swelling a couple of weeks ago so she took her diuretic daily for 3 days ~ 2 weeks ago.   ROS: All systems negative except as listed in HPI, PMH and Problem List.  SH:  Social History   Socioeconomic History   Marital status: Married    Spouse name: Reuel Boom Medico   Number of children: Not on file   Years of education: Not on file   Highest education level: Not on file  Occupational History   Not on file  Tobacco Use   Smoking status: Every Day    Current packs/day: 1.00    Average packs/day: 1 pack/day for 53.0 years (53.0 ttl pk-yrs)    Types: Cigarettes    Passive exposure: Current   Smokeless tobacco: Never   Tobacco comments:    1 PDD- khj 01/13/2023  Vaping Use   Vaping status: Never Used  Substance and Sexual Activity   Alcohol use: Yes    Alcohol/week: 6.0 standard drinks of alcohol    Types: 6 Glasses of wine per week    Comment: wine coolers   Drug use: Never   Sexual activity: Yes  Other Topics Concern   Not on file  Social History Narrative   Not on file   Social Determinants of Health   Financial Resource Strain: Not on file  Food Insecurity: No Food Insecurity (01/08/2023)   Hunger Vital Sign    Worried About Running Out of Food in the  Last Year: Never true    Ran Out of Food in the Last Year: Never true  Transportation Needs: No Transportation Needs (01/08/2023)   PRAPARE - Administrator, Civil Service (Medical): No    Lack of Transportation (Non-Medical): No  Physical Activity: Not on file  Stress: Not on file  Social Connections: Unknown (01/08/2023)   Social Connection and Isolation Panel [NHANES]    Frequency of Communication with Friends and Family: Never    Frequency of Social Gatherings with Friends and Family: Not on file    Attends Religious Services: Not on file    Active Member of Clubs or Organizations: Not on file    Attends Banker Meetings: Not on file    Marital Status: Not on file  Intimate Partner Violence: Not At Risk (01/08/2023)   Humiliation, Afraid, Rape, and Kick questionnaire    Fear of Current or Ex-Partner: No    Emotionally Abused: No    Physically Abused: No    Sexually Abused: No    FH:  Family History  Problem Relation Age of Onset   Cervical cancer Mother    Thyroid disease Mother    Prostate cancer Father    Cerebral aneurysm Sister     Past Medical History:  Diagnosis Date   Anemia  Arthritis    CHF (congestive heart failure) (HCC) 05/2022   Chronic cough    COPD (chronic obstructive pulmonary disease) (HCC)    Dupuytren contracture    Hypothyroidism    Lung cancer (HCC)     Current Outpatient Medications  Medication Sig Dispense Refill   albuterol (PROVENTIL) (2.5 MG/3ML) 0.083% nebulizer solution Take 3 mLs (2.5 mg total) by nebulization every 4 (four) hours as needed for wheezing or shortness of breath. 75 mL 12   albuterol (VENTOLIN HFA) 108 (90 Base) MCG/ACT inhaler Inhale 2 puffs into the lungs every 6 (six) hours as needed for wheezing or shortness of breath (COPD).     aspirin EC 81 MG tablet Take 1 tablet (81 mg total) by mouth daily. Swallow whole. 30 tablet 12   atorvastatin (LIPITOR) 40 MG tablet TAKE 1 TABLET BY MOUTH EVERY DAY  90 tablet 1   budesonide-formoterol (SYMBICORT) 80-4.5 MCG/ACT inhaler Inhale 2 puffs into the lungs 2 (two) times daily. (Patient taking differently: Inhale 2 puffs into the lungs at bedtime. Additional  2 puff in the morning if humid) 3 each 3   cyanocobalamin (VITAMIN B12) 1000 MCG tablet Take 1 tablet (1,000 mcg total) by mouth daily. 30 tablet 5   empagliflozin (JARDIANCE) 10 MG TABS tablet Take 1 tablet (10 mg total) by mouth daily before breakfast. 90 tablet 3   furosemide (LASIX) 40 MG tablet Take 1 tablet (40 mg total) by mouth daily. (Patient taking differently: Take 40 mg by mouth daily as needed for fluid.) 30 tablet 0   levothyroxine (SYNTHROID) 75 MCG tablet Take 75 mcg by mouth daily before breakfast.     sacubitril-valsartan (ENTRESTO) 97-103 MG Take 1 tablet by mouth 2 (two) times daily. 180 tablet 3   spironolactone (ALDACTONE) 25 MG tablet Take 1 tablet (25 mg total) by mouth daily. 90 tablet 3   Tiotropium Bromide Monohydrate (SPIRIVA RESPIMAT) 2.5 MCG/ACT AERS Inhale 2 puffs into the lungs daily.     metoprolol succinate (TOPROL-XL) 50 MG 24 hr tablet Take 1 tablet (50 mg total) by mouth daily. Take with or immediately following a meal. 90 tablet 3   No current facility-administered medications for this visit.   Facility-Administered Medications Ordered in Other Visits  Medication Dose Route Frequency Provider Last Rate Last Admin   albuterol (PROVENTIL) (2.5 MG/3ML) 0.083% nebulizer solution 2.5 mg  2.5 mg Nebulization Once Raechel Chute, MD       Vitals:   05/19/23 1111  BP: (!) 117/59  Pulse: 73  SpO2: 100%  Weight: 114 lb (51.7 kg)   Wt Readings from Last 3 Encounters:  05/19/23 114 lb (51.7 kg)  05/08/23 114 lb 12.8 oz (52.1 kg)  04/17/23 114 lb (51.7 kg)   Lab Results  Component Value Date   CREATININE 0.70 12/25/2022   CREATININE 0.66 11/29/2022   CREATININE 0.68 09/13/2022    PHYSICAL EXAM:  General:  Well appearing. No resp difficulty HEENT:  normal Neck: supple. JVP flat. No lymphadenopathy or thryomegaly appreciated. Cor: PMI normal. Regular rate & rhythm. No rubs, gallops or murmurs. Lungs: clear Abdomen: soft, nontender, nondistended. No hepatosplenomegaly. No bruits or masses.  Extremities: no cyanosis, clubbing, rash, trace edema around left ankle Neuro: alert & oriented x3, cranial nerves grossly intact. Moves all 4 extremities w/o difficulty. Affect pleasant.   ECG: not done   ASSESSMENT & PLAN:   1: Chronic heart failure with reduced ejection fraction- - suspect due to COPD/ anemia - NYHA class II -  euvolemic today - weighing daily; reminded to call for an overnight weight gain of > 2 pounds or a weekly weight gain of > 5 pounds - weight 114 pounds from last visit here 1 month ago - Echo 06/12/22: EF of 40-45% along with mild/ moderate LVH and mild/ moderate MR/TR - updated echo scheduled for 06/17/23 - adding "very little" salt and says that she's cut back a lot and is working on not adding any salt - drinking 3-4 glass of water and 2 cups of coffee and 1 wine cooler daily - continue jardiance 10mg  daily - continue entresto 97/103mg  BID - continue metoprolol succinate 50mg  daily; will not titrate today due to intermittent dizziness - continue spironolactone 25mg  daily - BNP 06/12/22 was 1015.7  2: COPD- - saw pulmonology (Dgayli) 07/24 - symbicort daily along with PRN abuterol - BMP 04/10/23 showed sodium 140, potassium 4.5, creatinine 0.9 & GFR 67  3: Tobacco use- - has been smoking since she was 72 y/o - continues to smoke 1 ppd & has no desire to quit  4: Anemia- - hemoglobin 11/29/22 was 13.2 - saw PCP Letitia Libra) 10/24  5: Lung cancer- - biopsy done 11/29/22 on nodule in LUL - PET scan done 12/18/22 - saw oncology Freida Busman) 11/24 - saw thoracic surgeon Cliffton Asters) 06/24; not surgical candidate due to recent PFT's and continued tobacco use - has finished radiation treatment - had chest CT 05/01/23  with slight decrease in nodule from 5mm => 4mm  6: Hypothyroidism- - TSH 04/10/23 was 9.219 - PCP is following this - currently taking levothyroxine 75 mcg daily   Return in 1 month, sooner if needed.

## 2023-05-19 NOTE — Patient Instructions (Signed)
Please have your Echocardiogram completed. You will check in at the [facility]. You have to arrive 15 mins early for preparation.

## 2023-05-22 ENCOUNTER — Ambulatory Visit
Admission: RE | Admit: 2023-05-22 | Discharge: 2023-05-22 | Disposition: A | Discharge status: Home / Self Care | Payer: Medicare Other | Source: Ambulatory Visit | Attending: Radiation Oncology | Admitting: Radiation Oncology

## 2023-05-22 ENCOUNTER — Encounter: Payer: Self-pay | Admitting: Radiation Oncology

## 2023-05-22 ENCOUNTER — Other Ambulatory Visit: Payer: Self-pay | Admitting: *Deleted

## 2023-05-22 VITALS — BP 127/74 | HR 79 | Temp 97.6°F | Resp 20 | Wt 115.0 lb

## 2023-05-22 DIAGNOSIS — Z923 Personal history of irradiation: Secondary | ICD-10-CM | POA: Insufficient documentation

## 2023-05-22 DIAGNOSIS — C3412 Malignant neoplasm of upper lobe, left bronchus or lung: Secondary | ICD-10-CM

## 2023-05-22 NOTE — Progress Notes (Signed)
Radiation Oncology Follow up Note  Name: Denise Allen   Date:   05/22/2023 MRN:  [id number] DOB: 02-01-1951    This 72 y.o. female presents to the clinic today for 42-month follow-up status post SBRT to her left upper lobe for a stage I squamous cell carcinoma.  REFERRING PROVIDER: Gracelyn Nurse, MD  HPI: The patient, with a history of stage one squamous cell carcinoma in the left upper lobe treated with SBRT, presents for a four month follow-up. She reports no new or worsening symptoms, specifically denying respiratory or swallowing difficulties, and cough. The most recent CT scan in October showed stable left upper pulmonary nodules with no new or progressive findings to suggest disease progression..  COMPLICATIONS OF TREATMENT: none  FOLLOW UP COMPLIANCE: keeps appointments   PHYSICAL EXAM:  BP 127/74   Pulse 79   Temp 97.6 F (36.4 C) (Tympanic)   Resp 20   Wt 115 lb (52.2 kg)   BMI 20.37 kg/m  Well-developed well-nourished patient in NAD. HEENT reveals PERLA, EOMI, discs not visualized.  Oral cavity is clear. No oral mucosal lesions are identified. Neck is clear without evidence of cervical or supraclavicular adenopathy. Lungs are clear to A&P. Cardiac examination is essentially unremarkable with regular rate and rhythm without murmur rub or thrill. Abdomen is benign with no organomegaly or masses noted. Motor sensory and DTR levels are equal and symmetric in the upper and lower extremities. Cranial nerves II through XII are grossly intact. Proprioception is intact. No peripheral adenopathy or edema is identified. No motor or sensory levels are noted. Crude visual fields are within normal range.  RADIOLOGY RESULTS: RADIOLOGY have reviewed her CT scans CT scan: Left upper pulmonary nodules stable. No new findings to suggest progression of disease. (04/2023)  PLAN: Stage 1 Squamous Cell Carcinoma of the Left Upper Lobe Status post SBRT with stable pulmonary nodules on recent CT  scan. No new or progressive findings suggestive of disease progression. No respiratory symptoms or side effects reported. -Plan for another CT scan in 6 months to monitor for any changes. -Discuss with Dr. B regarding the need for the scheduled CT scan in February.    Carmina Miller, MD

## 2023-05-23 ENCOUNTER — Telehealth: Payer: Self-pay | Admitting: *Deleted

## 2023-05-23 NOTE — Telephone Encounter (Signed)
Per Dr. Leonard Schwartz, okay to cancel follow up CT and appt in Feb 2025 since pt is closely followed by Dr. Rushie Chestnut with imaging. Pt made aware and informed of Feb appts being cancelled. Informed pt to keep appts as scheduled for CT in May and follow up with Dr. Rushie Chestnut in June. Pt verbalized understanding.

## 2023-06-17 ENCOUNTER — Ambulatory Visit
Admission: RE | Admit: 2023-06-17 | Discharge: 2023-06-17 | Disposition: A | Discharge status: Home / Self Care | Payer: Medicare Other | Source: Ambulatory Visit | Attending: Family

## 2023-06-17 DIAGNOSIS — I5022 Chronic systolic (congestive) heart failure: Secondary | ICD-10-CM | POA: Diagnosis not present

## 2023-06-17 DIAGNOSIS — J449 Chronic obstructive pulmonary disease, unspecified: Secondary | ICD-10-CM | POA: Diagnosis not present

## 2023-06-17 DIAGNOSIS — Z85118 Personal history of other malignant neoplasm of bronchus and lung: Secondary | ICD-10-CM | POA: Diagnosis not present

## 2023-06-17 DIAGNOSIS — I340 Nonrheumatic mitral (valve) insufficiency: Secondary | ICD-10-CM | POA: Insufficient documentation

## 2023-06-17 LAB — ECHOCARDIOGRAM COMPLETE
AR max vel: 2.29 cm2
AV Area VTI: 2.5 cm2
AV Area mean vel: 2.31 cm2
AV Mean grad: 3.5 mm[Hg]
AV Peak grad: 6.8 mm[Hg]
Ao pk vel: 1.31 m/s
Area-P 1/2: 3.95 cm2
Calc EF: 50.6 %
MV VTI: 2.25 cm2
S' Lateral: 3.8 cm
Single Plane A2C EF: 51.7 %
Single Plane A4C EF: 49.5 %

## 2023-06-17 NOTE — Progress Notes (Signed)
*  PRELIMINARY RESULTS* Echocardiogram 2D Echocardiogram has been performed.  Cristela Blue 06/17/2023, 11:42 AM

## 2023-06-20 ENCOUNTER — Telehealth: Payer: Self-pay | Admitting: Family

## 2023-06-20 NOTE — Telephone Encounter (Signed)
Pt confirmed appt for 06/23/23

## 2023-06-23 ENCOUNTER — Ambulatory Visit: Payer: Medicare Other | Attending: Family | Admitting: Family

## 2023-06-23 ENCOUNTER — Encounter: Payer: Self-pay | Admitting: Family

## 2023-06-23 VITALS — BP 122/66 | HR 61 | Wt 116.0 lb

## 2023-06-23 DIAGNOSIS — Z79899 Other long term (current) drug therapy: Secondary | ICD-10-CM | POA: Insufficient documentation

## 2023-06-23 DIAGNOSIS — Z85118 Personal history of other malignant neoplasm of bronchus and lung: Secondary | ICD-10-CM | POA: Insufficient documentation

## 2023-06-23 DIAGNOSIS — I428 Other cardiomyopathies: Secondary | ICD-10-CM | POA: Diagnosis not present

## 2023-06-23 DIAGNOSIS — C3490 Malignant neoplasm of unspecified part of unspecified bronchus or lung: Secondary | ICD-10-CM | POA: Diagnosis not present

## 2023-06-23 DIAGNOSIS — Z923 Personal history of irradiation: Secondary | ICD-10-CM | POA: Insufficient documentation

## 2023-06-23 DIAGNOSIS — E039 Hypothyroidism, unspecified: Secondary | ICD-10-CM | POA: Diagnosis not present

## 2023-06-23 DIAGNOSIS — Z79890 Hormone replacement therapy (postmenopausal): Secondary | ICD-10-CM | POA: Insufficient documentation

## 2023-06-23 DIAGNOSIS — I509 Heart failure, unspecified: Secondary | ICD-10-CM | POA: Diagnosis present

## 2023-06-23 DIAGNOSIS — I5022 Chronic systolic (congestive) heart failure: Secondary | ICD-10-CM | POA: Diagnosis not present

## 2023-06-23 DIAGNOSIS — D649 Anemia, unspecified: Secondary | ICD-10-CM | POA: Diagnosis not present

## 2023-06-23 DIAGNOSIS — J449 Chronic obstructive pulmonary disease, unspecified: Secondary | ICD-10-CM | POA: Diagnosis not present

## 2023-06-23 DIAGNOSIS — C3412 Malignant neoplasm of upper lobe, left bronchus or lung: Secondary | ICD-10-CM

## 2023-06-23 DIAGNOSIS — F17210 Nicotine dependence, cigarettes, uncomplicated: Secondary | ICD-10-CM | POA: Insufficient documentation

## 2023-06-23 DIAGNOSIS — Z720 Tobacco use: Secondary | ICD-10-CM | POA: Diagnosis not present

## 2023-06-23 NOTE — Progress Notes (Signed)
[id number], [name], MD (last seen 10/24) Primary Cardiologist: none  Chief Complaint: shortness of breath  HPI:  Denise Allen is a 72 y/o female with a history of COPD, thyroid disease, lung cancer (diagnosed 05/24), tobacco use and chronic heart failure.   Admitted 06/12/22 due to COPD/ HF exacerbation.   Echo 06/12/22: EF of 40-45% along with mild/ moderate LVH and mild/ moderate MR/TR.  Echo 06/17/23: EF 45-50% with grade I DD, mild hypokineses, normal PA pressure, mild/ moderate MR (HF provider says it could even be closer to 50-55%)  She presents today for a HF follow-up visit with a chief complaint of minimal shortness of breath with moderate exertion. Chronic in nature. Has associated fatigue (improving) along with this. Denies chest pain, cough, palpitations, abdominal distention, pedal edema, dizziness, difficulty sleeping or weight gain. Reviewed 06/17/23 echo results with patient.   Brings Kratom in and is asking if it will be ok to take for her chronic feet/leg pain. She says that a friend of hers gave it to her but she didn't want to take it until she asked about it  ROS: All systems negative except as listed in HPI, PMH and Problem List.  SH:  Social History   Socioeconomic History   Marital status: Married    Spouse name: Naylee Mise   Number of children: Not on file   Years of education: Not on file   Highest education level: Not on file  Occupational History   Not on file  Tobacco Use   Smoking status: Every Day    Current packs/day: 1.00    Average packs/day: 1 pack/day for 53.0 years (53.0 ttl pk-yrs)    Types: Cigarettes    Passive exposure: Current   Smokeless tobacco: Never   Tobacco comments:    1 PDD- khj 01/13/2023  Vaping Use   Vaping status: Never Used  Substance and Sexual Activity   Alcohol use: Yes    Alcohol/week: 6.0 standard drinks of alcohol    Types: 6 Glasses of wine per week    Comment: wine coolers   Drug use: Never   Sexual activity:  Yes  Other Topics Concern   Not on file  Social History Narrative   Not on file   Social Drivers of Health   Financial Resource Strain: Not on file  Food Insecurity: No Food Insecurity (01/08/2023)   Hunger Vital Sign    Worried About Running Out of Food in the Last Year: Never true    Ran Out of Food in the Last Year: Never true  Transportation Needs: No Transportation Needs (01/08/2023)   PRAPARE - Administrator, Civil Service (Medical): No    Lack of Transportation (Non-Medical): No  Physical Activity: Not on file  Stress: Not on file  Social Connections: Unknown (01/08/2023)   Social Connection and Isolation Panel [NHANES]    Frequency of Communication with Friends and Family: Never    Frequency of Social Gatherings with Friends and Family: Not on file    Attends Religious Services: Not on file    Active Member of Clubs or Organizations: Not on file    Attends Banker Meetings: Not on file    Marital Status: Not on file  Intimate Partner Violence: Not At Risk (01/08/2023)   Humiliation, Afraid, Rape, and Kick questionnaire    Fear of Current or Ex-Partner: No    Emotionally Abused: No    Physically Abused: No    Sexually Abused: No  FH:  Family History  Problem Relation Age of Onset   Cervical cancer Mother    Thyroid disease Mother    Prostate cancer Father    Cerebral aneurysm Sister     Past Medical History:  Diagnosis Date   Anemia    Arthritis    CHF (congestive heart failure) (HCC) 05/2022   Chronic cough    COPD (chronic obstructive pulmonary disease) (HCC)    Dupuytren contracture    Hypothyroidism    Lung cancer (HCC)     Current Outpatient Medications  Medication Sig Dispense Refill   albuterol (PROVENTIL) (2.5 MG/3ML) 0.083% nebulizer solution Take 3 mLs (2.5 mg total) by nebulization every 4 (four) hours as needed for wheezing or shortness of breath. 75 mL 12   albuterol (VENTOLIN HFA) 108 (90 Base) MCG/ACT inhaler  Inhale 2 puffs into the lungs every 6 (six) hours as needed for wheezing or shortness of breath (COPD).     aspirin EC 81 MG tablet Take 1 tablet (81 mg total) by mouth daily. Swallow whole. 30 tablet 12   atorvastatin (LIPITOR) 40 MG tablet TAKE 1 TABLET BY MOUTH EVERY DAY 90 tablet 1   budesonide-formoterol (SYMBICORT) 80-4.5 MCG/ACT inhaler Inhale 2 puffs into the lungs 2 (two) times daily. (Patient taking differently: Inhale 2 puffs into the lungs at bedtime. Additional  2 puff in the morning if humid) 3 each 3   cyanocobalamin (VITAMIN B12) 1000 MCG tablet Take 1 tablet (1,000 mcg total) by mouth daily. 30 tablet 5   empagliflozin (JARDIANCE) 10 MG TABS tablet Take 1 tablet (10 mg total) by mouth daily before breakfast. 90 tablet 3   furosemide (LASIX) 40 MG tablet Take 1 tablet (40 mg total) by mouth daily. (Patient taking differently: Take 40 mg by mouth daily as needed for fluid.) 30 tablet 0   levothyroxine (SYNTHROID) 75 MCG tablet Take 75 mcg by mouth daily before breakfast.     metoprolol succinate (TOPROL-XL) 50 MG 24 hr tablet Take 1 tablet (50 mg total) by mouth daily. Take with or immediately following a meal. 90 tablet 3   sacubitril-valsartan (ENTRESTO) 97-103 MG Take 1 tablet by mouth 2 (two) times daily. 180 tablet 3   spironolactone (ALDACTONE) 25 MG tablet Take 1 tablet (25 mg total) by mouth daily. 90 tablet 3   Tiotropium Bromide Monohydrate (SPIRIVA RESPIMAT) 2.5 MCG/ACT AERS Inhale 2 puffs into the lungs daily.     No current facility-administered medications for this visit.   Facility-Administered Medications Ordered in Other Visits  Medication Dose Route Frequency Provider Last Rate Last Admin   albuterol (PROVENTIL) (2.5 MG/3ML) 0.083% nebulizer solution 2.5 mg  2.5 mg Nebulization Once Raechel Chute, MD       Vitals:   06/23/23 1056  BP: 122/66  Pulse: 61  SpO2: (!) 89%  Weight: 116 lb (52.6 kg)   Wt Readings from Last 3 Encounters:  06/23/23 116 lb (52.6 kg)   05/22/23 115 lb (52.2 kg)  05/19/23 114 lb (51.7 kg)   Lab Results  Component Value Date   CREATININE 0.70 12/25/2022   CREATININE 0.66 11/29/2022   CREATININE 0.68 09/13/2022   PHYSICAL EXAM:  General:  Well appearing. No resp difficulty HEENT: normal Neck: supple. JVP flat. No lymphadenopathy or thryomegaly appreciated. Cor: PMI normal. Regular rate & rhythm. No rubs, gallops or murmurs. Lungs: clear Abdomen: soft, nontender, nondistended. No hepatosplenomegaly. No bruits or masses.  Extremities: no cyanosis, clubbing, rash, edema Neuro: alert & oriented x3, cranial  nerves grossly intact. Moves all 4 extremities w/o difficulty. Affect pleasant.   ECG: not done   ASSESSMENT & PLAN:   1: NICM with mildly reduced ejection fraction (although improving)- - suspect due to COPD/ anemia - NYHA class II - euvolemic today - weighing daily; reminded to call for an overnight weight gain of > 2 pounds or a weekly weight gain of > 5 pounds - weight up 2 pounds from last visit here 1 month ago - Echo 06/12/22: EF of 40-45% along with mild/ moderate LVH and mild/ moderate MR/TR - Echo 06/17/23: EF 45-50% with grade I DD, mild hypokineses, normal PA pressure, mild/ moderate MR - adding "very little" salt and says that she's cut back a lot and is working on not adding any salt - drinking 3-4 glass of water and 2 cups of coffee and 1 wine cooler daily - continue jardiance 10mg  daily - continue furosemide 40mg  PRN - continue metoprolol succinate 50mg  daily; HR 61 so no room for titration - continue entresto 97/103mg  BID - continue spironolactone 25mg  daily - advised to NOT take Kratom and discuss pain with PCP - BNP 06/12/22 was 1015.7  2: COPD- - saw pulmonology (Dgayli) 07/24 - symbicort daily along with PRN abuterol - BMP 04/10/23 showed sodium 140, potassium 4.5, creatinine 0.9 & GFR 67 - pulse ox today is 89% although she doesn't feel like her SOB is any worse  3: Tobacco  use- - has been smoking since she was 72 y/o - continues to smoke 1 ppd & has no desire to quit  4: Anemia- - hemoglobin 11/29/22 was 13.2 - saw PCP Letitia Libra) 10/24  5: Lung cancer- - biopsy done 11/29/22 on nodule in LUL - PET scan done 12/18/22 - saw oncology Freida Busman) 11/24 - saw thoracic surgeon Cliffton Asters) 06/24; not surgical candidate due to recent PFT's and continued tobacco use - has finished radiation treatment - had chest CT 05/01/23 with slight decrease in nodule from 5mm => 4mm  6: Hypothyroidism- - TSH 04/10/23 was 9.219 - PCP is following this - currently taking levothyroxine 75 mcg daily   Return in 4 months, sooner if needed

## 2023-08-04 ENCOUNTER — Other Ambulatory Visit: Payer: Self-pay

## 2023-08-04 ENCOUNTER — Telehealth: Payer: Self-pay | Admitting: Family

## 2023-08-04 MED ORDER — EMPAGLIFLOZIN 10 MG PO TABS: 10.0000 mg | ORAL_TABLET | Freq: Every day | ORAL | 3 refills | Status: DC

## 2023-08-05 NOTE — Telephone Encounter (Signed)
 done

## 2023-08-08 ENCOUNTER — Other Ambulatory Visit: Payer: Medicare Other

## 2023-08-15 ENCOUNTER — Ambulatory Visit: Payer: Medicare Other | Admitting: Internal Medicine

## 2023-09-05 ENCOUNTER — Other Ambulatory Visit: Payer: Self-pay | Admitting: Family

## 2023-10-01 ENCOUNTER — Other Ambulatory Visit: Payer: Self-pay | Admitting: Family

## 2023-10-21 ENCOUNTER — Telehealth: Payer: Self-pay | Admitting: Family

## 2023-10-21 NOTE — Telephone Encounter (Signed)
 Called to confirm/remind patient of their appointment at [facility] on 10/22/23.   Appointment:   [x] Confirmed  [] Left mess   [] No answer/No voice mail  [] VM Full/unable to leave message  [] Phone not in service  Patient reminded to bring all medications and/or complete list.  Confirmed patient has transportation. Gave directions, instructed to utilize valet parking.

## 2023-10-21 NOTE — Progress Notes (Unsigned)
 [facility] Note     [id number], Denise Bitters, MD (last seen 04/25) Primary Cardiologist: none  Chief Complaint: shortness of breath  HPI:  Denise Allen is a 73 y/o female with a history of COPD, thyroid  disease, lung cancer (diagnosed 05/24), tobacco use and chronic heart failure.   Admitted 06/12/22 due to COPD/ HF exacerbation. Echo 06/12/22: EF of 40-45% along with mild/ moderate LVH and mild/ moderate MR/TR.   Echo 06/17/23: EF 45-50% with grade I DD, mild hypokineses, normal PA pressure, mild/ moderate MR (HF provider says it could even be closer to 50-55%)  She presents today for a HF follow-up visit with a chief complaint of shortness of breath. Has associated moderate fatigue, occasional dizziness, decreased appetite and difficulty sleeping due to hand pain. Hand pain thought to be due to neuropathy. Denies chest pain, palpitations, abdominal distention or pedal edema. Decreased appetite with allergy symptoms and has lost weight by home scale.   Is feeling a little depressed because her pot belly pig had to be put down due to health issues.   ROS: All systems negative except as listed in HPI, PMH and Problem List.  SH:  Social History   Socioeconomic History   Marital status: Married    Spouse name: Bearl Limes Siebers   Number of children: Not on file   Years of education: Not on file   Highest education level: Not on file  Occupational History   Not on file  Tobacco Use   Smoking status: Every Day    Current packs/day: 1.00    Average packs/day: 1 pack/day for 53.0 years (53.0 ttl pk-yrs)    Types: Cigarettes    Passive exposure: Current   Smokeless tobacco: Never   Tobacco comments:    1 PDD- khj 01/13/2023  Vaping Use   Vaping status: Never Used  Substance and Sexual Activity   Alcohol use: Yes    Alcohol/week: 6.0 standard drinks of alcohol    Types: 6 Glasses of wine per week    Comment: wine coolers   Drug use: Never   Sexual activity: Yes  Other  Topics Concern   Not on file  Social History Narrative   Not on file   Social Drivers of Health   Financial Resource Strain: Not on file  Food Insecurity: No Food Insecurity (01/08/2023)   Hunger Vital Sign    Worried About Running Out of Food in the Last Year: Never true    Ran Out of Food in the Last Year: Never true  Transportation Needs: No Transportation Needs (01/08/2023)   PRAPARE - Administrator, Civil Service (Medical): No    Lack of Transportation (Non-Medical): No  Physical Activity: Not on file  Stress: Not on file  Social Connections: Unknown (01/08/2023)   Social Connection and Isolation Panel [NHANES]    Frequency of Communication with Friends and Family: Never    Frequency of Social Gatherings with Friends and Family: Not on file    Attends Religious Services: Not on file    Active Member of Clubs or Organizations: Not on file    Attends Banker Meetings: Not on file    Marital Status: Not on file  Intimate Partner Violence: Not At Risk (01/08/2023)   Humiliation, Afraid, Rape, and Kick questionnaire    Fear of Current or Ex-Partner: No    Emotionally Abused: No    Physically Abused: No    Sexually Abused: No    FH:  Family History  Problem Relation Age of Onset   Cervical cancer Mother    Thyroid  disease Mother    Prostate cancer Father    Cerebral aneurysm Sister     Past Medical History:  Diagnosis Date   Anemia    Arthritis    CHF (congestive heart failure) (HCC) 05/2022   Chronic cough    COPD (chronic obstructive pulmonary disease) (HCC)    Dupuytren contracture    Hypothyroidism    Lung cancer (HCC)     Current Outpatient Medications  Medication Sig Dispense Refill   albuterol  (PROVENTIL ) (2.5 MG/3ML) 0.083% nebulizer solution Take 3 mLs (2.5 mg total) by nebulization every 4 (four) hours as needed for wheezing or shortness of breath. 75 mL 12   albuterol  (VENTOLIN  HFA) 108 (90 Base) MCG/ACT inhaler Inhale 2 puffs  into the lungs every 6 (six) hours as needed for wheezing or shortness of breath (COPD).     aspirin  EC 81 MG tablet Take 1 tablet (81 mg total) by mouth daily. Swallow whole. 30 tablet 12   atorvastatin  (LIPITOR) 40 MG tablet TAKE 1 TABLET BY MOUTH EVERY DAY 90 tablet 1   budesonide -formoterol  (SYMBICORT ) 80-4.5 MCG/ACT inhaler Inhale 2 puffs into the lungs 2 (two) times daily. (Patient taking differently: Inhale 2 puffs into the lungs at bedtime. Additional  2 puff in the morning if humid) 3 each 3   cyanocobalamin  (VITAMIN B12) 1000 MCG tablet Take 1 tablet (1,000 mcg total) by mouth daily. 30 tablet 5   empagliflozin  (JARDIANCE ) 10 MG TABS tablet Take 1 tablet (10 mg total) by mouth daily before breakfast. 90 tablet 3   furosemide  (LASIX ) 40 MG tablet Take 1 tablet (40 mg total) by mouth daily. (Patient taking differently: Take 40 mg by mouth daily as needed for fluid.) 30 tablet 0   levothyroxine  (SYNTHROID ) 75 MCG tablet Take 75 mcg by mouth daily before breakfast.     metoprolol  succinate (TOPROL -XL) 50 MG 24 hr tablet Take 1 tablet (50 mg total) by mouth daily. Take with or immediately following a meal. 90 tablet 3   sacubitril -valsartan  (ENTRESTO ) 97-103 MG Take 1 tablet by mouth 2 (two) times daily. 180 tablet 3   spironolactone  (ALDACTONE ) 25 MG tablet Take 1 tablet (25 mg total) by mouth daily. 90 tablet 3   Tiotropium Bromide Monohydrate  (SPIRIVA  RESPIMAT) 2.5 MCG/ACT AERS Inhale 2 puffs into the lungs daily.     No current facility-administered medications for this visit.   Facility-Administered Medications Ordered in Other Visits  Medication Dose Route Frequency Provider Last Rate Last Admin   albuterol  (PROVENTIL ) (2.5 MG/3ML) 0.083% nebulizer solution 2.5 mg  2.5 mg Nebulization Once Dgayli, Khabib, MD        Vitals:   10/22/23 1046  BP: (!) 126/58  Pulse: 66  SpO2: 98%  Weight: 111 lb (50.3 kg)   Wt Readings from Last 3 Encounters:  10/22/23 111 lb (50.3 kg)  06/23/23  116 lb (52.6 kg)  05/22/23 115 lb (52.2 kg)   Lab Results  Component Value Date   CREATININE 0.70 12/25/2022   CREATININE 0.66 11/29/2022   CREATININE 0.68 09/13/2022     PHYSICAL EXAM:  General: Thin, well appearing. No resp difficulty HEENT: normal w/ raspy voice Neck: supple, no JVD Cor: Regular rhythm, rate. No rubs, gallops or murmurs Lungs: clear Abdomen: soft, nontender, nondistended. Extremities: no cyanosis, clubbing, rash, edema Neuro: alert & oriented X 3. Moves all 4 extremities w/o difficulty. Affect pleasant, tearful when talking  about her pot belly pig   ECG: not done   ASSESSMENT & PLAN:   1: NICM with mildly reduced ejection fraction- - suspect due to COPD/ anemia - NYHA class II - euvolemic today - weighing daily; reminded to call for an overnight weight gain of > 2 pounds or a weekly weight gain of > 5 pounds - weight down 5 pounds from last visit here 4 months ago - Echo 06/12/22: EF of 40-45% along with mild/ moderate LVH and mild/ moderate MR/TR - Echo 06/17/23: EF 45-50% with grade I DD, mild hypokineses, normal PA pressure, mild/ moderate MR - adding "very little" salt and says that she's cut back a lot and is working on not adding any salt - continue jardiance  10mg  daily - continue furosemide  40mg  PRN; hasn't taken this is "a long time" - continue metoprolol  succinate 50mg  daily; HR generally in the 60's so will not titrate this today - continue entresto  97/103mg  BID - continue spironolactone  25mg  daily - BNP 06/12/22 was 1015.7  2: COPD- - saw pulmonology (Dgayli) 07/24 - symbicort  daily along with PRN abuterol - BMP 10/10/23 reviewed: sodium 142, potassium 4.8, creatinine 1.0 & GFR 59  3: Tobacco use- - has been smoking since she was 73 y/o - continues to smoke 1 ppd & has no desire to quit  4: Anemia- - hemoglobin 10/10/23 was 12.9 - saw PCP Marisa Sickles) 04/25  5: Lung cancer- - biopsy done 11/29/22 on nodule in LUL - PET scan done  12/18/22 - saw thoracic surgeon Deloise Ferries) 06/24; not surgical candidate due to PFT's and continued tobacco use - had chest CT 05/01/23 with slight decrease in nodule from 5mm => 4mm - saw oncology Leighton Punches) 11/24 - has finished radiation treatment  6: Hypothyroidism- - TSH 10/10/23 was 0.428 - PCP is following this - currently taking levothyroxine  88 mcg daily   Return in 1-2 months to see HF MD since she has not seen a cardiologist. Sooner if needed.   Charlette Console, FNP 10/21/23

## 2023-10-22 ENCOUNTER — Ambulatory Visit: Payer: Medicare Other | Attending: Family | Admitting: Family

## 2023-10-22 ENCOUNTER — Encounter: Payer: Self-pay | Admitting: Family

## 2023-10-22 VITALS — BP 126/58 | HR 66 | Wt 111.0 lb

## 2023-10-22 DIAGNOSIS — I428 Other cardiomyopathies: Secondary | ICD-10-CM | POA: Diagnosis not present

## 2023-10-22 DIAGNOSIS — F17210 Nicotine dependence, cigarettes, uncomplicated: Secondary | ICD-10-CM | POA: Diagnosis not present

## 2023-10-22 DIAGNOSIS — Z85118 Personal history of other malignant neoplasm of bronchus and lung: Secondary | ICD-10-CM | POA: Diagnosis not present

## 2023-10-22 DIAGNOSIS — Z720 Tobacco use: Secondary | ICD-10-CM | POA: Diagnosis not present

## 2023-10-22 DIAGNOSIS — Z79890 Hormone replacement therapy (postmenopausal): Secondary | ICD-10-CM | POA: Insufficient documentation

## 2023-10-22 DIAGNOSIS — D649 Anemia, unspecified: Secondary | ICD-10-CM | POA: Diagnosis not present

## 2023-10-22 DIAGNOSIS — J449 Chronic obstructive pulmonary disease, unspecified: Secondary | ICD-10-CM

## 2023-10-22 DIAGNOSIS — G629 Polyneuropathy, unspecified: Secondary | ICD-10-CM | POA: Insufficient documentation

## 2023-10-22 DIAGNOSIS — E039 Hypothyroidism, unspecified: Secondary | ICD-10-CM | POA: Diagnosis not present

## 2023-10-22 DIAGNOSIS — C3412 Malignant neoplasm of upper lobe, left bronchus or lung: Secondary | ICD-10-CM

## 2023-10-22 DIAGNOSIS — I5022 Chronic systolic (congestive) heart failure: Secondary | ICD-10-CM

## 2023-10-22 DIAGNOSIS — Z7984 Long term (current) use of oral hypoglycemic drugs: Secondary | ICD-10-CM | POA: Insufficient documentation

## 2023-10-22 DIAGNOSIS — Z79899 Other long term (current) drug therapy: Secondary | ICD-10-CM | POA: Diagnosis not present

## 2023-10-22 DIAGNOSIS — R0602 Shortness of breath: Secondary | ICD-10-CM | POA: Diagnosis present

## 2023-10-22 MED ORDER — EMPAGLIFLOZIN 10 MG PO TABS
10.0000 mg | ORAL_TABLET | Freq: Every day | ORAL | 3 refills | Status: AC
Start: 2023-10-22 — End: ?

## 2023-10-22 NOTE — Patient Instructions (Addendum)
 Special Instructions // Education:  Discuss with primary care doctor about using gabapentin for your neuropathy.   If you receive a satisfaction survey regarding the Heart Failure Clinic, please take the time to fill it out. This way we can continue to provide excellent care and make any changes that need to be made.   Follow-Up in: 1-2 months with Dr. Alease Amend.  At the [facility], you and your health needs are our priority. We have a designated team specialized in the treatment of Heart Failure. This Care Team includes your primary Heart Failure Specialized Cardiologist (physician), Advanced Practice Providers (APPs- Physician Assistants and Nurse Practitioners), and Pharmacist who all work together to provide you with the care you need, when you need it.   You may see any of the following providers on your designated Care Team at your next follow up:  Dr. Jules Oar Dr. Peder Bourdon Dr. Alwin Baars Dr. Judyth Nunnery Shawnee Dellen, FNP Bevely Brush, RPH-CPP  Please be sure to bring in all your medications bottles to every appointment.   Need to Contact Us :  If you have any questions or concerns before your next appointment please send us  a message through [place] or call our office at [phone].    TO LEAVE A MESSAGE FOR THE NURSE SELECT OPTION 2, PLEASE LEAVE A MESSAGE INCLUDING: YOUR NAME DATE OF BIRTH CALL BACK NUMBER REASON FOR CALL**this is important as we prioritize the call backs  YOU WILL RECEIVE A CALL BACK THE SAME DAY AS LONG AS YOU CALL BEFORE 4:00 PM

## 2023-11-19 ENCOUNTER — Ambulatory Visit
Admission: RE | Admit: 2023-11-19 | Discharge: 2023-11-19 | Disposition: A | Discharge status: Home / Self Care | Payer: Medicare Other | Source: Ambulatory Visit | Attending: Radiation Oncology | Admitting: Radiation Oncology

## 2023-11-19 DIAGNOSIS — C3412 Malignant neoplasm of upper lobe, left bronchus or lung: Secondary | ICD-10-CM | POA: Diagnosis present

## 2023-11-19 MED ORDER — IOHEXOL 300 MG/ML  SOLN
75.0000 mL | Freq: Once | INTRAMUSCULAR | Status: AC | PRN
  Administered 2023-11-19: 75 mL via INTRAVENOUS

## 2023-12-03 ENCOUNTER — Other Ambulatory Visit: Payer: Self-pay | Admitting: *Deleted

## 2023-12-03 ENCOUNTER — Ambulatory Visit
Admission: RE | Admit: 2023-12-03 | Discharge: 2023-12-03 | Disposition: A | Discharge status: Home / Self Care | Payer: Medicare Other | Source: Ambulatory Visit | Attending: Radiation Oncology | Admitting: Radiation Oncology

## 2023-12-03 ENCOUNTER — Encounter: Payer: Self-pay | Admitting: Radiation Oncology

## 2023-12-03 VITALS — BP 111/62 | HR 67 | Temp 98.0°F | Resp 16 | Wt 112.0 lb

## 2023-12-03 DIAGNOSIS — C3412 Malignant neoplasm of upper lobe, left bronchus or lung: Secondary | ICD-10-CM

## 2023-12-03 DIAGNOSIS — Z923 Personal history of irradiation: Secondary | ICD-10-CM | POA: Diagnosis not present

## 2023-12-03 DIAGNOSIS — R918 Other nonspecific abnormal finding of lung field: Secondary | ICD-10-CM | POA: Insufficient documentation

## 2023-12-03 NOTE — Progress Notes (Signed)
 Radiation Oncology Follow up Note  Name: Denise Allen   Date:   12/03/2023 MRN:  [id number] DOB: 12-Jun-1951    This 73 y.o. female presents to the clinic today for 70-month follow-up status post SBRT to her left upper lobe for stage I squamous cell carcinoma.  REFERRING PROVIDER: Little Riff, MD  HPI: Patient is a 73 year old female now out 7 months having completed SBRT to her left upper lobe for stage I squamous of carcinoma.  Seen today in routine follow-up she is doing well.  She specifically [place] cough hemoptysis chest tightness or any significant change in her pulmonary status.  She had a recent CT scan.  Which shows no developing new masses or lesions in her chest.  Few scattered lung nodules are similar to previous studies.  She does have extensive atherosclerotic changes of her blood vessels.  COMPLICATIONS OF TREATMENT: none  FOLLOW UP COMPLIANCE: keeps appointments   PHYSICAL EXAM:  BP 111/62   Pulse 67   Temp 98 F (36.7 C) (Tympanic)   Resp 16   Wt 112 lb (50.8 kg)   BMI 19.84 kg/m  Well-developed well-nourished patient in NAD. HEENT reveals PERLA, EOMI, discs not visualized.  Oral cavity is clear. No oral mucosal lesions are identified. Neck is clear without evidence of cervical or supraclavicular adenopathy. Lungs are clear to A&P. Cardiac examination is essentially unremarkable with regular rate and rhythm without murmur rub or thrill. Abdomen is benign with no organomegaly or masses noted. Motor sensory and DTR levels are equal and symmetric in the upper and lower extremities. Cranial nerves II through XII are grossly intact. Proprioception is intact. No peripheral adenopathy or edema is identified. No motor or sensory levels are noted. Crude visual fields are within normal range.  RADIOLOGY RESULTS: CT scans reviewed compatible with above-stated findings  PLAN: The present time patient is doing well with excellent response to SBRT treatment.  I will see her back in 6  months for follow-up with a repeat CT scan and then go to once a year visits.  Patient comprehends my recommendations well.  I would like to take this opportunity to thank you for allowing me to participate in the care of your patient.Glenis Langdon, MD

## 2023-12-10 ENCOUNTER — Telehealth: Payer: Self-pay | Admitting: Family

## 2023-12-10 NOTE — Telephone Encounter (Signed)
 Called to confirm/remind patient of their appointment at the [facility] on 12/11/23.   Appointment:   [x] Confirmed  [] Left mess   [] No answer/No voice mail  [] VM Full/unable to leave message  [] Phone not in service  Patient reminded to bring all medications and/or complete list.  Confirmed patient has transportation. Gave directions, instructed to utilize valet parking.

## 2023-12-11 ENCOUNTER — Ambulatory Visit (INDEPENDENT_AMBULATORY_CARE_PROVIDER_SITE_OTHER): Admitting: Student in an Organized Health Care Education/Training Program

## 2023-12-11 ENCOUNTER — Encounter: Payer: Self-pay | Admitting: Cardiology

## 2023-12-11 ENCOUNTER — Ambulatory Visit: Attending: Cardiology | Admitting: Cardiology

## 2023-12-11 ENCOUNTER — Encounter: Payer: Self-pay | Admitting: Student in an Organized Health Care Education/Training Program

## 2023-12-11 VITALS — BP 106/56 | HR 55 | Wt 112.0 lb

## 2023-12-11 VITALS — BP 104/60 | HR 61 | Temp 98.2°F | Ht 63.0 in | Wt 111.6 lb

## 2023-12-11 DIAGNOSIS — Z79899 Other long term (current) drug therapy: Secondary | ICD-10-CM | POA: Insufficient documentation

## 2023-12-11 DIAGNOSIS — I959 Hypotension, unspecified: Secondary | ICD-10-CM | POA: Insufficient documentation

## 2023-12-11 DIAGNOSIS — Z7951 Long term (current) use of inhaled steroids: Secondary | ICD-10-CM | POA: Diagnosis not present

## 2023-12-11 DIAGNOSIS — I110 Hypertensive heart disease with heart failure: Secondary | ICD-10-CM | POA: Insufficient documentation

## 2023-12-11 DIAGNOSIS — C3490 Malignant neoplasm of unspecified part of unspecified bronchus or lung: Secondary | ICD-10-CM | POA: Diagnosis not present

## 2023-12-11 DIAGNOSIS — Z923 Personal history of irradiation: Secondary | ICD-10-CM | POA: Insufficient documentation

## 2023-12-11 DIAGNOSIS — J449 Chronic obstructive pulmonary disease, unspecified: Secondary | ICD-10-CM | POA: Diagnosis not present

## 2023-12-11 DIAGNOSIS — Z87891 Personal history of nicotine dependence: Secondary | ICD-10-CM | POA: Diagnosis not present

## 2023-12-11 DIAGNOSIS — F17200 Nicotine dependence, unspecified, uncomplicated: Secondary | ICD-10-CM | POA: Insufficient documentation

## 2023-12-11 DIAGNOSIS — I5042 Chronic combined systolic (congestive) and diastolic (congestive) heart failure: Secondary | ICD-10-CM | POA: Diagnosis not present

## 2023-12-11 DIAGNOSIS — I5022 Chronic systolic (congestive) heart failure: Secondary | ICD-10-CM | POA: Diagnosis present

## 2023-12-11 DIAGNOSIS — C3492 Malignant neoplasm of unspecified part of left bronchus or lung: Secondary | ICD-10-CM

## 2023-12-11 MED ORDER — ENTRESTO 49-51 MG PO TABS: 1.0000 | ORAL_TABLET | Freq: Two times a day (BID) | ORAL | 3 refills | Status: DC

## 2023-12-11 MED ORDER — SPIRIVA RESPIMAT 2.5 MCG/ACT IN AERS: 2.0000 | INHALATION_SPRAY | Freq: Every day | RESPIRATORY_TRACT | 11 refills | Status: DC

## 2023-12-11 MED ORDER — SPIRIVA RESPIMAT 2.5 MCG/ACT IN AERS: 2.0000 | INHALATION_SPRAY | Freq: Every day | RESPIRATORY_TRACT | 11 refills | Status: AC

## 2023-12-11 MED ORDER — SPIRIVA RESPIMAT 2.5 MCG/ACT IN AERS: 2.0000 | INHALATION_SPRAY | Freq: Every day | RESPIRATORY_TRACT | Status: DC

## 2023-12-11 MED ORDER — BISOPROLOL FUMARATE 2.5 MG PO TABS
2.5000 mg | ORAL_TABLET | Freq: Every day | ORAL | 3 refills | Status: AC
Start: 2023-12-11 — End: ?

## 2023-12-11 NOTE — Progress Notes (Signed)
 Assessment & Plan:   #Chronic obstructive pulmonary disease (GOLD II-B)  History of smoking and COPD based on PFT's (FEV1 66% predicted). She has a significant drop in her DLCO secondary to Emphysema. We had prescribed triple therapy (Symbicort  and Spiriva ) and I've stressed compliance.  Continue triple therapy (spiriva  and symbicort ). Smoking cessation advised  - Continue Symbicort  160-4.5 two puffs twice daily - Tiotropium Bromide Monohydrate  (SPIRIVA  RESPIMAT) 2.5 MCG/ACT AERS; Inhale 2 puffs into the lungs daily.  Dispense: 60 each; Refill: 11  #Stage 1A NSCLCa  8 mm LUL nodule was biopsied and showed NSCLCa (Squamous Cell) now s/p SBT. She continues to be radiographically monitored with Rad Onc. Once surveillance is complete, we will re-enroll in our lung cancer screening program.   #Tobacco Use Disorder  History of smoking and continues to quit. I have counseled the patient regarding the importance of smoking cessation.  Return in about 1 year (around 12/10/2024).  I spent 34 minutes caring for this patient today, including preparing to see the patient, obtaining a medical history , reviewing a separately obtained history, performing a medically appropriate examination and/or evaluation, counseling and educating the patient/family/caregiver, ordering medications, tests, or procedures, documenting clinical information in the electronic health record, and independently interpreting results (not separately reported/billed) and communicating results to the patient/family/caregiver. 3 minutes utilized to counsel the patient on the importance of smoking cessation.  Vergia Glasgow, MD [facility]  End of visit medications:  Meds ordered this encounter  Medications   DISCONTD: Tiotropium Bromide Monohydrate  (SPIRIVA  RESPIMAT) 2.5 MCG/ACT AERS    Sig: Inhale 2 puffs into the lungs daily.    Lot Number?:   [id number]    Expiration Date?:   07/01/2024    Quantity:   2    Tiotropium Bromide Monohydrate  (SPIRIVA  RESPIMAT) 2.5 MCG/ACT AERS    Sig: Inhale 2 puffs into the lungs daily.    Dispense:  60 each    Refill:  11    Lot Number?:   [id number]    Expiration Date?:   07/01/2024    Quantity:   2     Current Outpatient Medications:    albuterol  (PROVENTIL ) (2.5 MG/3ML) 0.083% nebulizer solution, Take 3 mLs (2.5 mg total) by nebulization every 4 (four) hours as needed for wheezing or shortness of breath., Disp: 75 mL, Rfl: 12   albuterol  (VENTOLIN  HFA) 108 (90 Base) MCG/ACT inhaler, Inhale 2 puffs into the lungs every 6 (six) hours as needed for wheezing or shortness of breath (COPD)., Disp: , Rfl:    aspirin  EC 81 MG tablet, Take 1 tablet (81 mg total) by mouth daily. Swallow whole., Disp: 30 tablet, Rfl: 12   atorvastatin  (LIPITOR) 40 MG tablet, TAKE 1 TABLET BY MOUTH EVERY DAY, Disp: 90 tablet, Rfl: 1   budesonide -formoterol  (SYMBICORT ) 80-4.5 MCG/ACT inhaler, Inhale 2 puffs into the lungs 2 (two) times daily., Disp: 3 each, Rfl: 3   cyanocobalamin  (VITAMIN B12) 1000 MCG tablet, Take 1 tablet (1,000 mcg total) by mouth daily., Disp: 30 tablet, Rfl: 5   empagliflozin  (JARDIANCE ) 10 MG TABS tablet, Take 1 tablet (10 mg total) by mouth daily before breakfast., Disp: 90 tablet, Rfl: 3   furosemide  (LASIX ) 40 MG tablet, Take 1 tablet (40 mg total) by mouth daily., Disp: 30 tablet, Rfl: 0   levothyroxine  (SYNTHROID ) 75 MCG tablet, Take 88 mcg by mouth daily before breakfast., Disp: , Rfl:    metoprolol  succinate (TOPROL -XL) 50 MG 24 hr tablet, Take 1 tablet (50  mg total) by mouth daily. Take with or immediately following a meal., Disp: 90 tablet, Rfl: 3   sacubitril -valsartan  (ENTRESTO ) 97-103 MG, Take 1 tablet by mouth 2 (two) times daily., Disp: 180 tablet, Rfl: 3   spironolactone  (ALDACTONE ) 25 MG tablet, Take 1 tablet (25 mg total) by mouth daily., Disp: 90 tablet, Rfl: 3   Tiotropium Bromide Monohydrate  (SPIRIVA  RESPIMAT) 2.5 MCG/ACT AERS, Inhale 2 puffs into the  lungs daily., Disp: 60 each, Rfl: 11 No current facility-administered medications for this visit.  Facility-Administered Medications Ordered in Other Visits:    albuterol  (PROVENTIL ) (2.5 MG/3ML) 0.083% nebulizer solution 2.5 mg, 2.5 mg, Nebulization, Once, Vergia Glasgow, MD   Subjective:   PATIENT ID: Denise Allen GENDER: female DOB: Apr 10, 1951, MRN: [id number]  Chief Complaint  Patient presents with   Follow-up    Doing well.  Only has SOB during humid weather.  Has a cough but this is normal for her.    HPI  Patient is a pleasant 73 year old female with a past medical history of COPD and recently diagnosed Stage I NSCLCa of the LUL (s/p SBRT) presenting to clinic for follow up.  Patient was enrolled in our lung cancer screening program and was noted to have a small left upper lobe nodule. She underwent robotic assisted navigational bronchoscopy on 11/29/2022 with navigation to the LUL nodule and cytology showing moderately differentiated squamous cell carcinoma.    Patient was referred to oncology and was seen by Dr. Valentine Gasmen, thoracic surgery (Dr. Deloise Ferries), and radiation oncology (Dr. Jacalyn Martin). Patient's pulmonary function percluded resection, and she underwent SBRT to the LUL nodule with success.   She presents today for follow up and has no complaints. Chronic cough previously reported is unchanged. She continues to be short of breath with significant exertion. Her symptoms are stable and unchanged compared to prior. She has not used her Spiriva . She continues to smoke. She was referred to the heart failure team and is prescribed goal-directed medical therapy.   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  She has a longstanding history of smoking (53 pack years).  She does not have any occupational exposures.  No personal history of lung cancer.   Chest CT 09/2021 Lungs/Pleura: Centrilobular and paraseptal emphysema evident. Biapical pleuroparenchymal  scarring again noted. As before, tiny pulmonary nodules are identified, stable. No new suspicious pulmonary nodule or mass. No focal airspace consolidation. No pleural effusion.   Chest CT 10/2022   IMPRESSION: Lung-RADS 4B, suspicious. Additional imaging evaluation or consultation with Pulmonology or Thoracic Surgery recommended. Enlarging left upper lobe pulmonary nodule of 7.3 mm. Potential clinical strategies include three-month follow-up diagnostic chest CT, PET (low end of resolution) or biopsy.  CT Chest 11/19/2023  No developing new mass lesion, fluid collection or lymph node enlargement. Few scattered lung nodules are similar to previous overall when adjusted for technique. Simple attention on follow up surveillance.   Ancillary information including prior medications, full medical/surgical/family/social histories, and PFTs (when available) are listed below and have been reviewed.   Review of Systems  Constitutional:  Negative for chills, fever, malaise/fatigue and weight loss.  Respiratory:  Positive for cough and shortness of breath. Negative for hemoptysis and sputum production.   Cardiovascular:  Negative for chest pain.     Objective:   Vitals:   12/11/23 1031  BP: 104/60  Pulse: 61  Temp: 98.2 F (36.8 C)  TempSrc: Oral  SpO2: 98%  Weight: 111 lb 9.6 oz (50.6 kg)  Height:  5' 3 (1.6 m)   98% on RA  BMI Readings from Last 3 Encounters:  12/11/23 19.77 kg/m  12/03/23 19.84 kg/m  10/22/23 19.66 kg/m   Wt Readings from Last 3 Encounters:  12/11/23 111 lb 9.6 oz (50.6 kg)  12/03/23 112 lb (50.8 kg)  10/22/23 111 lb (50.3 kg)    Physical Exam Constitutional:      Appearance: Normal appearance.  HENT:     Head: Normocephalic.     Mouth/Throat:     Mouth: Mucous membranes are moist.   Cardiovascular:     Rate and Rhythm: Normal rate and regular rhythm.     Pulses: Normal pulses.     Heart sounds: Normal heart sounds.  Pulmonary:     Effort:  Pulmonary effort is normal.     Breath sounds: Normal breath sounds. No wheezing, rhonchi or rales.  Abdominal:     Palpations: Abdomen is soft.   Musculoskeletal:     Right lower leg: No edema.     Left lower leg: No edema.   Neurological:     General: No focal deficit present.     Mental Status: She is alert and oriented to person, place, and time. Mental status is at baseline.       Ancillary Information    Past Medical History:  Diagnosis Date   Anemia    Arthritis    CHF (congestive heart failure) (HCC) 05/2022   Chronic cough    COPD (chronic obstructive pulmonary disease) (HCC)    Dupuytren contracture    Hypothyroidism    Lung cancer (HCC)      Family History  Problem Relation Age of Onset   Cervical cancer Mother    Thyroid  disease Mother    Prostate cancer Father    Cerebral aneurysm Sister      Past Surgical History:  Procedure Laterality Date   BREAST EXCISIONAL BIOPSY Left 90s   benign   BRONCHIAL BIOPSY  11/29/2022   Procedure: BRONCHIAL BIOPSIES;  Surgeon: Vergia Glasgow, MD;  Location: [facility];  Service: Pulmonary;;   BRONCHIAL NEEDLE ASPIRATION BIOPSY  11/29/2022   Procedure: BRONCHIAL NEEDLE ASPIRATION BIOPSIES;  Surgeon: Vergia Glasgow, MD;  Location: [facility];  Service: Pulmonary;;   COLONOSCOPY  07/02/2007   COLONOSCOPY N/A 05/02/2021   Procedure: COLONOSCOPY;  Surgeon: Toledo, Alphonsus Jeans, MD;  Location: [facility];  Service: Gastroenterology;  Laterality: N/A;   ENDOBRONCHIAL ULTRASOUND  11/29/2022   Procedure: ENDOBRONCHIAL ULTRASOUND;  Surgeon: Vergia Glasgow, MD;  Location: [facility];  Service: Pulmonary;;   MASTECTOMY PARTIAL / LUMPECTOMY Left    benign   TUBAL LIGATION  1976    Social History   Socioeconomic History   Marital status: Married    Spouse name: Bearl Limes Huskins   Number of children: Not on file   Years of education: Not on file   Highest education level: Not on file  Occupational History   Not on file   Tobacco Use   Smoking status: Every Day    Current packs/day: 1.00    Average packs/day: 1 pack/day for 53.0 years (53.0 ttl pk-yrs)    Types: Cigarettes    Passive exposure: Current   Smokeless tobacco: Never   Tobacco comments:    1 PDD- khj 01/13/2023  Vaping Use   Vaping status: Never Used  Substance and Sexual Activity   Alcohol use: Yes    Alcohol/week: 6.0 standard drinks of alcohol    Types: 6 Glasses of wine per week  Comment: wine coolers   Drug use: Never   Sexual activity: Yes  Other Topics Concern   Not on file  Social History Narrative   Not on file   Social Drivers of Health   Financial Resource Strain: Not on file  Food Insecurity: Unknown (01/08/2023)   Hunger Vital Sign    Worried About Running Out of Food in the Last Year: Never true    Ran Out of Food in the Last Year: Not on file  Transportation Needs: No Transportation Needs (01/08/2023)   PRAPARE - Administrator, Civil Service (Medical): No    Lack of Transportation (Non-Medical): No  Physical Activity: Not on file  Stress: Not on file  Social Connections: Unknown (01/08/2023)   Social Connection and Isolation Panel    Frequency of Communication with Friends and Family: Never    Frequency of Social Gatherings with Friends and Family: Not on file    Attends Religious Services: Not on file    Active Member of Clubs or Organizations: Not on file    Attends Banker Meetings: Not on file    Marital Status: Not on file  Intimate Partner Violence: Not At Risk (01/08/2023)   Humiliation, Afraid, Rape, and Kick questionnaire    Fear of Current or Ex-Partner: No    Emotionally Abused: No    Physically Abused: No    Sexually Abused: No     Allergies  Allergen Reactions   Penicillins Other (See Comments)    Patient states NOT ALLERGIC causes severe yeast infection. Prefers NOT to use it.     CBC    Component Value Date/Time   WBC 8.4 11/29/2022 0956   RBC 4.60  11/29/2022 0956   HGB 13.2 11/29/2022 0956   HCT 41.4 11/29/2022 0956   PLT 304 11/29/2022 0956   MCV 90.0 11/29/2022 0956   MCH 28.7 11/29/2022 0956   MCHC 31.9 11/29/2022 0956   RDW 14.6 11/29/2022 0956   LYMPHSABS 1.2 06/15/2022 0432   MONOABS 0.8 06/15/2022 0432   EOSABS 0.0 06/15/2022 0432   BASOSABS 0.0 06/15/2022 0432    Pulmonary Functions Testing Results:    Latest Ref Rng & Units 12/19/2022   11:30 AM  PFT Results  FVC-Pre L 2.14   FVC-Predicted Pre % 77   FVC-Post L 2.29   FVC-Predicted Post % 82   Pre FEV1/FVC % % 59   Post FEV1/FCV % % 60   FEV1-Pre L 1.27   FEV1-Predicted Pre % 61   FEV1-Post L 1.37   DLCO uncorrected ml/min/mmHg 5.20   DLCO UNC% % 27   DLVA Predicted % 66   TLC L 4.90   TLC % Predicted % 99   RV % Predicted % 138     Outpatient Medications Prior to Visit  Medication Sig Dispense Refill   albuterol  (PROVENTIL ) (2.5 MG/3ML) 0.083% nebulizer solution Take 3 mLs (2.5 mg total) by nebulization every 4 (four) hours as needed for wheezing or shortness of breath. 75 mL 12   albuterol  (VENTOLIN  HFA) 108 (90 Base) MCG/ACT inhaler Inhale 2 puffs into the lungs every 6 (six) hours as needed for wheezing or shortness of breath (COPD).     aspirin  EC 81 MG tablet Take 1 tablet (81 mg total) by mouth daily. Swallow whole. 30 tablet 12   atorvastatin  (LIPITOR) 40 MG tablet TAKE 1 TABLET BY MOUTH EVERY DAY 90 tablet 1   budesonide -formoterol  (SYMBICORT ) 80-4.5 MCG/ACT inhaler Inhale 2 puffs into  the lungs 2 (two) times daily. 3 each 3   cyanocobalamin  (VITAMIN B12) 1000 MCG tablet Take 1 tablet (1,000 mcg total) by mouth daily. 30 tablet 5   empagliflozin  (JARDIANCE ) 10 MG TABS tablet Take 1 tablet (10 mg total) by mouth daily before breakfast. 90 tablet 3   furosemide  (LASIX ) 40 MG tablet Take 1 tablet (40 mg total) by mouth daily. 30 tablet 0   levothyroxine  (SYNTHROID ) 75 MCG tablet Take 88 mcg by mouth daily before breakfast.     metoprolol  succinate  (TOPROL -XL) 50 MG 24 hr tablet Take 1 tablet (50 mg total) by mouth daily. Take with or immediately following a meal. 90 tablet 3   sacubitril -valsartan  (ENTRESTO ) 97-103 MG Take 1 tablet by mouth 2 (two) times daily. 180 tablet 3   spironolactone  (ALDACTONE ) 25 MG tablet Take 1 tablet (25 mg total) by mouth daily. 90 tablet 3   Tiotropium Bromide Monohydrate  (SPIRIVA  RESPIMAT) 2.5 MCG/ACT AERS Inhale 2 puffs into the lungs daily.     Facility-Administered Medications Prior to Visit  Medication Dose Route Frequency Provider Last Rate Last Admin   albuterol  (PROVENTIL ) (2.5 MG/3ML) 0.083% nebulizer solution 2.5 mg  2.5 mg Nebulization Once Harjit Douds, MD

## 2023-12-11 NOTE — Patient Instructions (Addendum)
 Medication Changes:  Discontinue Metoprolol   Decrease Entresto  to 49-51 MG twice daily   Follow-Up in: 6 months with Dr. Alease Amend.  Our Doctors' schedules are NOT open yet for 6 months. We will place you on our recall list. Once they are available, we will call you to schedule your follow up appointment.   At the [facility], you and your health needs are our priority. We have a designated team specialized in the treatment of Heart Failure. This Care Team includes your primary Heart Failure Specialized Cardiologist (physician), Advanced Practice Providers (APPs- Physician Assistants and Nurse Practitioners), and Pharmacist who all work together to provide you with the care you need, when you need it.   You may see any of the following providers on your designated Care Team at your next follow up:  Dr. Jules Oar Dr. Peder Bourdon Dr. Alwin Baars Dr. Judyth Nunnery Shawnee Dellen, FNP Bevely Brush, RPH-CPP  Please be sure to bring in all your medications bottles to every appointment.   Need to Contact Us :  If you have any questions or concerns before your next appointment please send us  a message through [place] or call our office at [phone].    TO LEAVE A MESSAGE FOR THE NURSE SELECT OPTION 2, PLEASE LEAVE A MESSAGE INCLUDING: YOUR NAME DATE OF BIRTH CALL BACK NUMBER REASON FOR CALL**this is important as we prioritize the call backs  YOU WILL RECEIVE A CALL BACK THE SAME DAY AS LONG AS YOU CALL BEFORE 4:00 PM

## 2023-12-11 NOTE — Progress Notes (Signed)
   ADVANCED HEART FAILURE FOLLOW UP CLINIC NOTE  Referring Physician: Little Riff, MD  Primary Care: Little Riff, MD Primary Cardiologist:  HPI: Denise Allen is a 73 y.o. female with a PMH of COPD, thyroid  disease, lung cancer (diagnosed 05/24), tobacco use and chronic heart failure who presents for follow up of chronic systolic heart failure.      Admitted 06/12/22 due to COPD/ HF exacerbation. Echo 06/12/22: EF of 40-45% along with mild/ moderate LVH and mild/ moderate MR/TR.    Echo 06/17/23: EF 45-50% with grade I DD, mild hypokineses, normal PA pressure, mild/ moderate MR (HF provider says it could even be closer to 50-55%)     SUBJECTIVE:  Overall doing fairly well, no complaints related to HF. She does have some occasional lightheadedness, but no orthopnea, PND. She has stable moderate exertional shortness of breath. Uses her inhalers as ordered with some improvement.   PMH, current medications, allergies, social history, and family history reviewed in epic.  PHYSICAL EXAM: Vitals:   12/11/23 1119  BP: (!) 106/56  Pulse: (!) 55  SpO2: 100%   GENERAL: chronically ill appearing PULM:  Normal work of breathing, clear to auscultation bilaterally. Respirations are unlabored.  CARDIAC:  JVP: flat         Bradycardic, normal rhythm, no m/g/r, no edema ABDOMEN: Soft, non-tender, non-distended. NEUROLOGIC: Patient is oriented x3 with no focal or lateralizing neurologic deficits.    ASSESSMENT & PLAN:  Chronic diastolic heart failure: NYHA Class II, though confounded with known COPD. Volume symptoms minimal, given symptoms and borderline BP as well as relatively preserved EF will decrease entresto . - Stop metoprolol  - Start bisoprolol  given COPD, 2.5mg  daily - Reduce entresto  to 49/51mg  BID - Continue spironolactone  25mg  daily, can decrease GDMT judiciously for low BP, I suspect her HF symptoms are minimal - Continue jardiance  10mg  daily - Euvolemic, no need for  diuretics  Hypertension: Hypotensive today. - Adjustments as above  COPD: Stable symptoms, no wheezing today. - Continue symbicort  2 puffs BID  Lung cancer: Not a candidate due to pulmonary function - Has completed radiation therapy, follows with oncology  Follow up in 6 months  Arta Lark, MD Advanced [facility] Mechanical Circulatory Support 12/14/23

## 2024-02-04 ENCOUNTER — Telehealth: Payer: Self-pay

## 2024-02-04 NOTE — Progress Notes (Unsigned)
 Pt called stating she is having a dental procedure soon and would like to know if any of her medications would interfere with her procedure? Asked pt if she would be put on any new medications through the dentist but pt was unaware. Pt may need surgical clearance. Does pt need an appt to be seen? Please advise.

## 2024-02-06 NOTE — Progress Notes (Signed)
 Called and spoke with pt regarding dental procedure. Pt had good understanding and no further questions. Pt will inform us  if dental office needs anything from Ellouise Class, FNP.

## 2024-02-08 ENCOUNTER — Other Ambulatory Visit: Payer: Self-pay | Admitting: Family

## 2024-03-14 ENCOUNTER — Other Ambulatory Visit: Payer: Self-pay | Admitting: Family

## 2024-05-13 ENCOUNTER — Other Ambulatory Visit (HOSPITAL_COMMUNITY): Payer: Self-pay

## 2024-05-24 ENCOUNTER — Telehealth: Payer: Self-pay | Admitting: Cardiology

## 2024-05-24 NOTE — Telephone Encounter (Signed)
 Called patient, who states she spoke with her pharmacy and realized she still had refills available. No other refills needed at this time. 6 month follow up appt scheduled with patient.

## 2024-06-03 ENCOUNTER — Encounter: Payer: Self-pay | Admitting: Radiation Oncology

## 2024-06-03 ENCOUNTER — Telehealth: Payer: Self-pay | Admitting: Radiation Oncology

## 2024-06-03 ENCOUNTER — Ambulatory Visit: Admission: RE | Admit: 2024-06-03 | Source: Ambulatory Visit

## 2024-06-03 NOTE — Telephone Encounter (Signed)
 Called pt to r/s missed CT appt - pt had death in the family and will be out of town til after Christmas - r/s CT w/pt and got MD follow up r/s via Chrystal's team - pt req appt reminders via mail - [facility]

## 2024-06-11 ENCOUNTER — Telehealth: Payer: Self-pay | Admitting: Family

## 2024-06-11 NOTE — Telephone Encounter (Signed)
 Called to confirm/remind patient of their appointment at the [facility] on 06/14/24.   Appointment:   [x] Confirmed  [] Left mess   [] No answer/No voice mail  [] VM Full/unable to leave message  [] Phone not in service  Patient reminded to bring all medications and/or complete list.  Confirmed patient has transportation. Gave directions, instructed to utilize valet parking.

## 2024-06-13 NOTE — Progress Notes (Unsigned)
° °  ADVANCED HEART FAILURE FOLLOW UP CLINIC NOTE  Referring Physician: Rudolpho Norleen BIRCH, MD  Primary Care: Rudolpho Norleen BIRCH, MD Primary Cardiologist:  HPI: Denise Allen is a 73 y.o. female with a PMH of COPD, thyroid  disease, lung cancer (diagnosed 05/24), tobacco use and chronic heart failure who presents for follow up of chronic systolic heart failure.      Admitted 06/12/22 due to COPD/ HF exacerbation. Echo 06/12/22: EF of 40-45% along with mild/ moderate LVH and mild/ moderate MR/TR.    Echo 06/17/23: EF 45-50% with grade I DD, mild hypokineses, normal PA pressure, mild/ moderate MR (HF provider says it could even be closer to 50-55%)     SUBJECTIVE:  Overall doing fairly well, no complaints related to HF. She does have some occasional lightheadedness, but no orthopnea, PND. She has stable moderate exertional shortness of breath. Uses her inhalers as ordered with some improvement.   PMH, current medications, allergies, social history, and family history reviewed in epic.  PHYSICAL EXAM: There were no vitals filed for this visit.  GENERAL: chronically ill appearing PULM:  Normal work of breathing, clear to auscultation bilaterally. Respirations are unlabored.  CARDIAC:  JVP: flat         Bradycardic, normal rhythm, no m/g/r, no edema ABDOMEN: Soft, non-tender, non-distended. NEUROLOGIC: Patient is oriented x3 with no focal or lateralizing neurologic deficits.    ASSESSMENT & PLAN:  Chronic diastolic heart failure: NYHA Class II, though confounded with known COPD. Volume symptoms minimal, given symptoms and borderline BP as well as relatively preserved EF will decrease entresto . - Stop metoprolol  - Start bisoprolol  given COPD, 2.5mg  daily - Reduce entresto  to 49/51mg  BID - Continue spironolactone  25mg  daily, can decrease GDMT judiciously for low BP, I suspect her HF symptoms are minimal - Continue jardiance  10mg  daily - Euvolemic, no need for diuretics  Hypertension:  Hypotensive today. - Adjustments as above  COPD: Stable symptoms, no wheezing today. - Continue symbicort  2 puffs BID  Lung cancer: Not a candidate due to pulmonary function - Has completed radiation therapy, follows with oncology  Follow up in 6 months  Morene Brownie, MD Advanced [facility] Failure Mechanical Circulatory Support 06/13/2024

## 2024-06-14 ENCOUNTER — Ambulatory Visit: Admitting: Family

## 2024-06-14 ENCOUNTER — Other Ambulatory Visit
Admission: RE | Admit: 2024-06-14 | Discharge: 2024-06-14 | Disposition: A | Source: Ambulatory Visit | Attending: Family | Admitting: Family

## 2024-06-14 ENCOUNTER — Ambulatory Visit: Payer: Self-pay | Admitting: Family

## 2024-06-14 ENCOUNTER — Encounter: Payer: Self-pay | Admitting: Family

## 2024-06-14 VITALS — BP 92/68 | HR 65 | Wt 114.4 lb

## 2024-06-14 DIAGNOSIS — J449 Chronic obstructive pulmonary disease, unspecified: Secondary | ICD-10-CM | POA: Diagnosis not present

## 2024-06-14 DIAGNOSIS — I5022 Chronic systolic (congestive) heart failure: Secondary | ICD-10-CM | POA: Diagnosis present

## 2024-06-14 DIAGNOSIS — I5032 Chronic diastolic (congestive) heart failure: Secondary | ICD-10-CM | POA: Diagnosis not present

## 2024-06-14 DIAGNOSIS — C3412 Malignant neoplasm of upper lobe, left bronchus or lung: Secondary | ICD-10-CM | POA: Diagnosis not present

## 2024-06-14 DIAGNOSIS — I10 Essential (primary) hypertension: Secondary | ICD-10-CM

## 2024-06-14 LAB — BASIC METABOLIC PANEL WITH GFR
Anion gap: 11 (ref 5–15)
BUN: 34 mg/dL — ABNORMAL HIGH (ref 8–23)
CO2: 20 mmol/L — ABNORMAL LOW (ref 22–32)
Calcium: 9.5 mg/dL (ref 8.9–10.3)
Chloride: 107 mmol/L (ref 98–111)
Creatinine, Ser: 1.16 mg/dL — ABNORMAL HIGH (ref 0.44–1.00)
GFR, Estimated: 49 mL/min — ABNORMAL LOW (ref >60)
Glucose, Bld: 89 mg/dL (ref 70–99)
Potassium: 5.3 mmol/L — ABNORMAL HIGH (ref 3.5–5.1)
Sodium: 138 mmol/L (ref 135–145)

## 2024-06-14 MED ORDER — SACUBITRIL-VALSARTAN 24-26 MG PO TABS: 1.0000 | ORAL_TABLET | Freq: Two times a day (BID) | ORAL | 3 refills | Status: AC

## 2024-06-14 NOTE — Patient Instructions (Addendum)
 Medication Changes:  DECREASE Entresto  to 24-26mg  (1 tab) two times daily  Lab Work:  Go over to the [facility]. Go pass the gift shop and have your blood work completed.   We will only call you if the results are abnormal or if the provider would like to make medication changes.  No news is good news.    Follow-Up in: Please follow up with the [facility] in 1 month with Ellouise Class, FNP.   Thank you for choosing [facility].    At the [facility], you and your health needs are our priority. We have a designated team specialized in the treatment of Heart Failure. This Care Team includes your primary Heart Failure Specialized Cardiologist (physician), Advanced Practice Providers (APPs- Physician Assistants and Nurse Practitioners), and Pharmacist who all work together to provide you with the care you need, when you need it.   You may see any of the following providers on your designated Care Team at your next follow up:  Dr. Toribio Fuel Dr. Ezra Shuck Dr. Ria Commander Dr. Morene Brownie Ellouise Class, FNP Jaun Bash, RPH-CPP  Please be sure to bring in all your medications bottles to every appointment.   Need to Contact Us :  If you have any questions or concerns before your next appointment please send us  a message through [place] or call our office at [phone].    TO LEAVE A MESSAGE FOR THE NURSE SELECT OPTION 2, PLEASE LEAVE A MESSAGE INCLUDING: YOUR NAME DATE OF BIRTH CALL BACK NUMBER REASON FOR CALL**this is important as we prioritize the call backs  YOU WILL RECEIVE A CALL BACK THE SAME DAY AS LONG AS YOU CALL BEFORE 4:00 PM

## 2024-06-15 MED ORDER — SPIRONOLACTONE 25 MG PO TABS: 12.5000 mg | ORAL_TABLET | Freq: Every day | ORAL | 3 refills | Status: DC

## 2024-06-15 MED ORDER — FUROSEMIDE 40 MG PO TABS: 40.0000 mg | ORAL_TABLET | Freq: Every day | ORAL | Status: AC | PRN

## 2024-06-15 NOTE — Telephone Encounter (Signed)
 Spoke to pt. Pt agreeable to medication changes and fluid intake. No further questions at this time.

## 2024-06-17 ENCOUNTER — Ambulatory Visit: Admitting: Radiation Oncology

## 2024-06-23 ENCOUNTER — Encounter: Payer: Self-pay | Admitting: Radiation Oncology

## 2024-07-06 ENCOUNTER — Ambulatory Visit
Admission: RE | Admit: 2024-07-06 | Discharge: 2024-07-06 | Disposition: A | Discharge status: Home / Self Care | Source: Ambulatory Visit | Attending: Radiation Oncology | Admitting: Radiation Oncology

## 2024-07-06 DIAGNOSIS — C3412 Malignant neoplasm of upper lobe, left bronchus or lung: Secondary | ICD-10-CM | POA: Insufficient documentation

## 2024-07-06 MED ORDER — IOHEXOL 300 MG/ML  SOLN
60.0000 mL | Freq: Once | INTRAMUSCULAR | Status: AC | PRN
  Administered 2024-07-06: 60 mL via INTRAVENOUS

## 2024-07-14 ENCOUNTER — Encounter: Payer: Self-pay | Admitting: Radiation Oncology

## 2024-07-14 ENCOUNTER — Ambulatory Visit
Admission: RE | Admit: 2024-07-14 | Discharge: 2024-07-14 | Disposition: A | Discharge status: Home / Self Care | Source: Ambulatory Visit | Attending: Radiation Oncology | Admitting: Radiation Oncology

## 2024-07-14 ENCOUNTER — Telehealth: Payer: Self-pay | Admitting: Radiation Oncology

## 2024-07-14 ENCOUNTER — Other Ambulatory Visit: Payer: Self-pay | Admitting: *Deleted

## 2024-07-14 VITALS — BP 114/71 | HR 69 | Temp 98.3°F | Resp 20 | Wt 113.0 lb

## 2024-07-14 DIAGNOSIS — C3412 Malignant neoplasm of upper lobe, left bronchus or lung: Secondary | ICD-10-CM | POA: Insufficient documentation

## 2024-07-14 DIAGNOSIS — Z923 Personal history of irradiation: Secondary | ICD-10-CM | POA: Diagnosis not present

## 2024-07-14 NOTE — Telephone Encounter (Signed)
 Called pt to sched CT - pt confirmed date/time/location - pt requested appt reminder via mail - sent via mychart and mail - [facility]

## 2024-07-14 NOTE — Progress Notes (Signed)
 Radiation Oncology Follow up Note  Name: Denise Allen   Date:   07/14/2024 MRN:  [id number] DOB: March 04, 1951    This 74 y.o. female presents to the clinic today for 48-month follow-up status post SBRT to her left upper lobe for stage I squamous cell carcinoma.  REFERRING PROVIDER: Rudolpho Norleen BIRCH, MD  HPI: Patient is a 74 year old female now out 16 months having completed SBRT to her left upper lobe for stage I squamous of carcinoma.  Seen today in routine follow-up she is doing well she is getting over a cold although specifically denies cough hemoptysis chest tightness or any change in her pulmonary status.  She had a recent CT scan of her chest which has not been formally read but on my review shows excellent response to treatment with only some minimal residual scarring in that region..  COMPLICATIONS OF TREATMENT: none  FOLLOW UP COMPLIANCE: keeps appointments   PHYSICAL EXAM:  BP 114/71   Pulse 69   Temp 98.3 F (36.8 C) (Tympanic)   Resp 20   Wt 113 lb (51.3 kg)   BMI 20.02 kg/m  Well-developed well-nourished patient in NAD. HEENT reveals PERLA, EOMI, discs not visualized.  Oral cavity is clear. No oral mucosal lesions are identified. Neck is clear without evidence of cervical or supraclavicular adenopathy. Lungs are clear to A&P. Cardiac examination is essentially unremarkable with regular rate and rhythm without murmur rub or thrill. Abdomen is benign with no organomegaly or masses noted. Motor sensory and DTR levels are equal and symmetric in the upper and lower extremities. Cranial nerves II through XII are grossly intact. Proprioception is intact. No peripheral adenopathy or edema is identified. No motor or sensory levels are noted. Crude visual fields are within normal range.  RADIOLOGY RESULTS: CT scans reviewed compatible with above-stated findings  PLAN: At the present time patient is doing well excellent response to SBRT treatment with minimal side effects.  And pleased  with her overall progress.  I think we can go out a whole year with follow-up CT scan and follow-up.  Patient knows to call with any concerns at any time.  I would like to take this opportunity to thank you for allowing me to participate in the care of your patient.SABRA Marcey Penton, MD

## 2024-07-16 ENCOUNTER — Telehealth: Payer: Self-pay | Admitting: Family

## 2024-07-16 NOTE — Telephone Encounter (Signed)
 Called to confirm/remind patient of their appointment at the [facility] on 07/19/24.   Appointment:   [x] Confirmed  [] Left mess   [] No answer/No voice mail  [] VM Full/unable to leave message  [] Phone not in service  Patient reminded to bring all medications and/or complete list.  Confirmed patient has transportation. Gave directions, instructed to utilize valet parking.

## 2024-07-18 NOTE — Progress Notes (Unsigned)
" ° °  ADVANCED HEART FAILURE FOLLOW UP CLINIC NOTE  Referring Physician: Rudolpho Norleen BIRCH, MD  Primary Care: Rudolpho Norleen BIRCH, MD HF Provider: Odis Brownie, MD  Chief Complaint:    HPI: Denise Allen is a 74 y.o. female with a PMH of COPD, thyroid  disease, lung cancer (diagnosed 05/24), tobacco use and chronic heart failure who presents for follow up of chronic systolic heart failure.   Admitted 06/12/22 due to COPD/ HF exacerbation. Echo 06/12/22: EF of 40-45% along with mild/ moderate LVH and mild/ moderate MR/TR.    Echo 06/17/23: EF 45-50% with grade I DD, mild hypokineses, normal PA pressure, mild/ moderate MR (HF provider says it could even be closer to 50-55%)  Seen in [facility] 06/25 where entresto  was decreased and beta blocker was changed to bisoprolol  with her COPD.   Seen in [facility] 12/25 where entresto  was decreased to 24/26mg  due to BP. After lab results received, spironolactone  was decreased to 12.5mg  daily due hyperkalemia.    SUBJECTIVE:  She presents today for a HF follow-up visit with a chief complaint of   ? Neuropthay, ? Last took lasix   Estimates that she drinks 16 ounces of water, 2 cups of coffee and either boost or milk during the day.   ROS: All systems negative except what is listed in HPI, PMH and Problem List  PMH, current medications, allergies, social history, and family history reviewed in epic.  PHYSICAL EXAM:  General: Frail appearing elderly female.  Cor: No JVD. Regular rhythm, rate.  Lungs: clear Abdomen: soft, nontender, nondistended. Extremities: no edema Neuro:. Affect pleasant     ASSESSMENT & PLAN:  Chronic diastolic heart failure: NYHA Class II, though confounded with known COPD. Volume symptoms minimal, given symptoms and borderline BP as well as relatively preserved EF will decrease entresto  again. - NYHA class II/ euvolemic today - weight 114.4 from last visit here 1 month ago - Continue bisoprolol  given COPD, 2.5mg  daily - Continue  jardiance  10mg  daily - Continue furosemide  40mg  daily PRN. She hasn't taken this in months - Continue entresto  24/26mg  BID. If BP does not improve, may need to stop it completely, could place on losartan if needed - Continue spironolactone  12.5mg  daily, can decrease GDMT judiciously for low BP - can get compression socks  - reviewed the importance of keeping daily fluid intake to 60-64 ounces  Hypertension:  - BP  - drank an 8 ounce bottle of water while in the office - BMET 06/14/24 reviewed: sodium 138, potassium 5.3, creatinine 1.16 GFR 49. Spiro was decreased to 12.5mg  daily - BMET today  COPD: Stable symptoms, no wheezing today. - Continue symbicort  2 puffs BID - continues to smoke 1ppd cigarettes  Lung cancer: Not a candidate due to pulmonary function - Has completed radiation therapy, follows with oncology     Ellouise DELENA Class FNP-C 07/18/24  "

## 2024-07-19 ENCOUNTER — Ambulatory Visit: Payer: Self-pay | Admitting: Family

## 2024-07-19 ENCOUNTER — Encounter: Payer: Self-pay | Admitting: Family

## 2024-07-19 ENCOUNTER — Ambulatory Visit: Admitting: Family

## 2024-07-19 ENCOUNTER — Other Ambulatory Visit
Admission: RE | Admit: 2024-07-19 | Discharge: 2024-07-19 | Disposition: A | Discharge status: Home / Self Care | Source: Ambulatory Visit | Attending: Family | Admitting: Family

## 2024-07-19 VITALS — BP 121/58 | HR 65 | Wt 111.4 lb

## 2024-07-19 DIAGNOSIS — I5032 Chronic diastolic (congestive) heart failure: Secondary | ICD-10-CM | POA: Insufficient documentation

## 2024-07-19 DIAGNOSIS — J449 Chronic obstructive pulmonary disease, unspecified: Secondary | ICD-10-CM | POA: Diagnosis not present

## 2024-07-19 DIAGNOSIS — I10 Essential (primary) hypertension: Secondary | ICD-10-CM | POA: Diagnosis not present

## 2024-07-19 DIAGNOSIS — C3412 Malignant neoplasm of upper lobe, left bronchus or lung: Secondary | ICD-10-CM

## 2024-07-19 LAB — BASIC METABOLIC PANEL WITH GFR
Anion gap: 11 (ref 5–15)
BUN: 43 mg/dL — ABNORMAL HIGH (ref 8–23)
CO2: 23 mmol/L (ref 22–32)
Calcium: 8.6 mg/dL — ABNORMAL LOW (ref 8.9–10.3)
Chloride: 108 mmol/L (ref 98–111)
Creatinine, Ser: 1.46 mg/dL — ABNORMAL HIGH (ref 0.44–1.00)
GFR, Estimated: 37 mL/min — ABNORMAL LOW
Glucose, Bld: 86 mg/dL (ref 70–99)
Potassium: 5.1 mmol/L (ref 3.5–5.1)
Sodium: 142 mmol/L (ref 135–145)

## 2024-07-19 NOTE — Patient Instructions (Addendum)
 It was good to see you today!  If you receive a satisfaction survey regarding the [facility], please take the time to fill it out. This way we can continue to provide excellent care and make any changes that need to be made.   Medication Changes:  No changes made today!  Lab Work:  Go over to the [facility]. Go pass the gift shop and have your blood work completed.   We will only call you if the results are abnormal or if the provider would like to make medication changes.  No news is good news.   Follow-Up in: Please follow up with the [facility] in 4 months with Ellouise Class, FNP.   Thank you for choosing [facility].    At the [facility], you and your health needs are our priority. We have a designated team specialized in the treatment of Heart Failure. This Care Team includes your primary Heart Failure Specialized Cardiologist (physician), Advanced Practice Providers (APPs- Physician Assistants and Nurse Practitioners), and Pharmacist who all work together to provide you with the care you need, when you need it.   You may see any of the following providers on your designated Care Team at your next follow up:  Dr. Toribio Fuel Dr. Ezra Shuck Dr. Ria Commander Dr. Morene Brownie Ellouise Class, FNP Jaun Bash, RPH-CPP  Please be sure to bring in all your medications bottles to every appointment.   Need to Contact Us :  If you have any questions or concerns before your next appointment please send us  a message through [facility] or call our office at [phone].    TO LEAVE A MESSAGE FOR THE NURSE SELECT OPTION 2, PLEASE LEAVE A MESSAGE INCLUDING: YOUR NAME DATE OF BIRTH CALL BACK NUMBER REASON FOR CALL**this is important as we prioritize the call backs  YOU WILL RECEIVE A CALL BACK THE SAME DAY AS LONG AS YOU CALL BEFORE 4:00 PM

## 2024-08-03 ENCOUNTER — Other Ambulatory Visit (HOSPITAL_COMMUNITY): Payer: Self-pay

## 2024-11-15 ENCOUNTER — Ambulatory Visit: Admitting: Family

## 2025-07-14 ENCOUNTER — Ambulatory Visit

## 2025-07-27 ENCOUNTER — Ambulatory Visit: Admitting: Radiation Oncology
